# Patient Record
Sex: Female | Born: 2000 | Race: White | Hispanic: No | Marital: Married | Smoking: Former smoker
Health system: Southern US, Community
[De-identification: ages and names within clinical notes are randomized; demographics above are authoritative.]

## PROBLEM LIST (undated history)

## (undated) DIAGNOSIS — F913 Oppositional defiant disorder: Secondary | ICD-10-CM

## (undated) DIAGNOSIS — Z9889 Other specified postprocedural states: Secondary | ICD-10-CM

## (undated) DIAGNOSIS — R112 Nausea with vomiting, unspecified: Secondary | ICD-10-CM

## (undated) DIAGNOSIS — A159 Respiratory tuberculosis unspecified: Secondary | ICD-10-CM

## (undated) DIAGNOSIS — E0590 Thyrotoxicosis, unspecified without thyrotoxic crisis or storm: Secondary | ICD-10-CM

## (undated) DIAGNOSIS — R569 Unspecified convulsions: Secondary | ICD-10-CM

## (undated) DIAGNOSIS — M5126 Other intervertebral disc displacement, lumbar region: Secondary | ICD-10-CM

## (undated) DIAGNOSIS — F329 Major depressive disorder, single episode, unspecified: Secondary | ICD-10-CM

## (undated) DIAGNOSIS — F32A Depression, unspecified: Secondary | ICD-10-CM

## (undated) DIAGNOSIS — F419 Anxiety disorder, unspecified: Secondary | ICD-10-CM

## (undated) DIAGNOSIS — R519 Headache, unspecified: Secondary | ICD-10-CM

## (undated) DIAGNOSIS — F988 Other specified behavioral and emotional disorders with onset usually occurring in childhood and adolescence: Secondary | ICD-10-CM

## (undated) DIAGNOSIS — I499 Cardiac arrhythmia, unspecified: Secondary | ICD-10-CM

## (undated) HISTORY — PX: WISDOM TOOTH EXTRACTION: SHX21

---
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
## 2019-04-27 DIAGNOSIS — M2550 Pain in unspecified joint: Secondary | ICD-10-CM | POA: Insufficient documentation

## 2019-05-10 DIAGNOSIS — F331 Major depressive disorder, recurrent, moderate: Secondary | ICD-10-CM | POA: Insufficient documentation

## 2019-10-31 ENCOUNTER — Ambulatory Visit (HOSPITAL_COMMUNITY): Payer: Medicaid Other | Attending: Family Medicine | Admitting: Physical Therapy

## 2019-10-31 ENCOUNTER — Encounter (INDEPENDENT_AMBULATORY_CARE_PROVIDER_SITE_OTHER): Payer: Self-pay

## 2019-10-31 ENCOUNTER — Encounter (HOSPITAL_COMMUNITY): Payer: Self-pay | Admitting: Physical Therapy

## 2019-10-31 ENCOUNTER — Other Ambulatory Visit: Payer: Self-pay

## 2019-10-31 DIAGNOSIS — M545 Low back pain, unspecified: Secondary | ICD-10-CM

## 2019-10-31 DIAGNOSIS — G8929 Other chronic pain: Secondary | ICD-10-CM | POA: Insufficient documentation

## 2019-10-31 DIAGNOSIS — M6281 Muscle weakness (generalized): Secondary | ICD-10-CM | POA: Diagnosis present

## 2019-10-31 NOTE — Therapy (Signed)
[facility] [address] [place], [place], [address] Phone: [phone]   Fax:  [phone]  Physical Therapy Evaluation  Patient Details  Name: Sandy Carter MRN: [id number] Date of Birth: June 15, 2001 Referring Provider (PT): Tanna Furry, MD   Encounter Date: 10/31/2019  PT End of Session - 10/31/19 1618    Visit Number  1    Number of Visits  9    Date for PT Re-Evaluation  11/28/19    Authorization Type  Medicaid    Authorization Time Period  10/31/19 - 11/28/19    Authorization - Visit Number  0    Authorization - Number of Visits  8    PT Start Time  1525   patient arrived late   PT Stop Time  1615    PT Time Calculation (min)  50 min    Activity Tolerance  Patient tolerated treatment well    Behavior During Therapy  [facility] for tasks assessed/performed;Anxious       Past Medical History:  Diagnosis Date  . ADD (attention deficit disorder)    Borderline  . Anxiety   . Depression   . ODD (oppositional defiant disorder)     History reviewed. No pertinent surgical history.  There were no vitals filed for this visit.   Subjective Assessment - 10/31/19 1535    Subjective  Patient reported that she has had back pain since 3rd or 4th grade. She doesn't remember what happened for sure, but she did report an incident involving a table falling on her leg which she said may have triggered her initial back pain. She reported that she has had a cortisone shot in her back, but that it did not help. She reported that the pain goes from her mid back down through her low back. She reported that she has hypothyroidism and that issues with this have been increasing recently. She reported that she does not have radiating pain through legs. Patient denied any tingling or numbness. She reported that laying down makes the pain worse and that she feels like she has to move every few minutes. 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  Patient reported that the maximum her back pain has been is a 7/10. Patient reported that she likes to do sit-ups but usually can only do a few before she feels like she is "slamming her back" into the ground.    Limitations  Sitting;Standing    How long can you sit comfortably?  30 minutes    How long can you stand comfortably?  30 minutes    How long can you walk comfortably?  Couple hours    Diagnostic tests  X-ray    Patient Stated Goals  Less pain    Currently in Pain?  Yes    Pain Score  5     Pain Location  Back    Pain Orientation  Right    Pain Descriptors / Indicators  Sharp    Pain Type  Chronic pain    Pain Onset  More than a month ago    Pain Frequency  Constant    Aggravating Factors   laying down    Pain Relieving Factors  Nothing    Effect of Pain on Daily Activities  Moderately         [facility] PT Assessment - 10/31/19 0001      Assessment   Medical Diagnosis  LBP    Referring Provider (PT)  Ralene Bathe  Zhou-Talbert, MD    Onset Date/Surgical Date  --   Many years   Next MD Visit  Unknown    Prior Therapy  None      Precautions   Precautions  None      Restrictions   Weight Bearing Restrictions  No      Balance Screen   Has the patient fallen in the past 6 months  No    Has the patient had a decrease in activity level because of a fear of falling?   No    Is the patient reluctant to leave their home because of a fear of falling?   No      Home Film/video editor residence    Living Arrangements  Spouse/significant other    Type of [place]  Two level      Prior Function   Level of Independence  Independent    Vocation  Unemployed;Student      Cognition   Overall Cognitive Status  Within Functional Limits for tasks assessed      Observation/Other Assessments   Observations  No observed scoliotic curve with forward bend test      Sensation   Light Touch  Appears Intact      Deep Tendon Reflexes   DTR  Assessment Site  Patella    Patella DTR  2+      ROM / Strength   AROM / PROM / Strength  AROM;Strength      AROM   AROM Assessment Site  Lumbar    Lumbar Flexion  WFL    Lumbar Extension  WFL, painful    Lumbar - Right Side Bend  WFL, painful    Lumbar - Left Side Bend  WFL    Lumbar - Right Rotation  WFL    Lumbar - Left Rotation  [facility]      Strength   Strength Assessment Site  Hip;Knee;Ankle    Right/Left Hip  Right;Left    Right Hip Flexion  5/5    Right Hip Extension  4+/5    Right Hip ABduction  4+/5    Left Hip Flexion  5/5    Left Hip Extension  4+/5    Left Hip ABduction  4+/5    Right/Left Knee  Right;Left    Right Knee Flexion  5/5    Right Knee Extension  5/5    Left Knee Flexion  5/5    Left Knee Extension  5/5    Right/Left Ankle  Right;Left    Right Ankle Dorsiflexion  5/5    Left Ankle Dorsiflexion  5/5      Flexibility   Soft Tissue Assessment /Muscle Length  yes    Hamstrings  WFL      Palpation   Spinal mobility  Hypermobile throughout    Palpation comment  Tender through lumbar spine, painful with SI compression and distraction. Denied pain through bilateral gluteals or to lumbar paraspinals.       Balance   Balance Assessed  --   SLS > 1 min bilateral               Objective measurements completed on examination: See above findings.              PT Education - 10/31/19 1618    Education Details  Discussed examination findings and POC.    Person(s) Educated  Patient    Methods  Explanation    Comprehension  Verbalized understanding       PT Short Term Goals - 10/31/19 1620      PT SHORT TERM GOAL #1   Title  Patient will report understanding and regular compliance with HEP to improve strength and decrease back pain.    Time  2    Period  Weeks    Status  New    Target Date  11/14/19      PT SHORT TERM GOAL #2   Title  Patient will report that her back pain has not exceeded a 5/10 over the course of a 1 week  period indicating improved tolerance to daily activities.    Time  2    Period  Weeks    Status  New    Target Date  11/14/19        PT Long Term Goals - 10/31/19 1621      PT LONG TERM GOAL #1   Title  Patient will report that her back pain has not exceeded a 3/10 over the course of a 1 week period indicating improved tolerance to daily activities.    Time  4    Period  Weeks    Status  New    Target Date  11/28/19      PT LONG TERM GOAL #2   Title  Patient will report an improvement of at least 50% in overall symptoms to improve QOL.    Time  4    Period  Weeks    Status  New    Target Date  11/28/19      PT LONG TERM GOAL #3   Title  Patient will report ability to sit for at least 1 hour in order to perform school work without increased pain.    Time  4    Period  Weeks    Status  New    Target Date  11/28/19      PT LONG TERM GOAL #4   Title  Patient will report ability to stand for at least 1 hour to perform household chores without increased pain.    Time  4    Period  Weeks    Status  New    Target Date  11/28/19             Plan - 10/31/19 1639    Clinical Impression Statement  Patient is a 19 year old female with ongoing low back pain of many years time. Patient presents with pain with some lumbar AROM as well as decreased strength in some proximal hip muscles. In addition, patient demonstrates self-manipulation of spine and other joints during evaluation. Hypermobility was felt with spinal assessment throughout spine as well as some positive SI tests. Patient's signs and symptoms point most to low back pain with hypermobility indicating strengthening as primary focus of therapy. Patient would benefit from skilled physical therapy in order to address the abovementioned deficits and help patient return to her PLOF.    Personal Factors and Comorbidities  Time since onset of injury/illness/exacerbation;Behavior Pattern;Comorbidity 3+    Comorbidities  MDD, ODD,  Anxiety    Examination-Activity Limitations  Stand;Locomotion Level;Sleep    Examination-Participation Restrictions  Cleaning;Meal Prep;Community Activity;School;Shop    Stability/Clinical Decision Making  Stable/Uncomplicated    Clinical Decision Making  Low    Rehab Potential  Good    PT Frequency  2x / week    PT Duration  4 weeks    PT  Treatment/Interventions  ADLs/Self Care Home Management;Aquatic Therapy;Cryotherapy;Electrical Stimulation;Moist Heat;Traction;DME Instruction;Gait training;Stair training;Functional mobility training;Therapeutic activities;Therapeutic exercise;Balance training;Neuromuscular re-education;Patient/family education;Manual techniques;Passive range of motion;Dry needling    PT Next Visit Plan  Review eval/goals. Focus on core strengthening and provide for HEP trial: plank variations, quadruped variations. Postural strengthening.    PT Home Exercise Plan  Initiate at first session    Consulted and Agree with Plan of Care  Patient       Patient will benefit from skilled therapeutic intervention in order to improve the following deficits and impairments:  Improper body mechanics, Pain, Hypermobility, Decreased activity tolerance, Decreased endurance, Postural dysfunction, Decreased strength  Visit Diagnosis: Chronic bilateral low back pain without sciatica  Muscle weakness (generalized)     Problem List Patient Active Problem List   Diagnosis Date Noted  . Itchy eyes 02/16/2016  . MDD (major depressive disorder), recurrent severe, 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  . Disruptive mood dysregulation disorder (HCC) 06/10/2014  . 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 ODD (oppositional defiant disorder) 06/10/2014   Verne Carrow PT, DPT 4:44 PM, 10/31/19 [phone]  [facility] [address] [place], [place], [address] Phone: [phone]   Fax:  [phone]  Name: Sandy Carter MRN:  [id number] Date of Birth: 2000/12/10

## 2019-11-02 ENCOUNTER — Ambulatory Visit (HOSPITAL_COMMUNITY): Payer: Medicaid Other | Admitting: Physical Therapy

## 2019-11-02 ENCOUNTER — Other Ambulatory Visit: Payer: Self-pay

## 2019-11-02 ENCOUNTER — Encounter (HOSPITAL_COMMUNITY): Payer: Self-pay | Admitting: Physical Therapy

## 2019-11-02 DIAGNOSIS — M545 Low back pain, unspecified: Secondary | ICD-10-CM

## 2019-11-02 DIAGNOSIS — M6281 Muscle weakness (generalized): Secondary | ICD-10-CM

## 2019-11-02 DIAGNOSIS — G8929 Other chronic pain: Secondary | ICD-10-CM

## 2019-11-02 NOTE — Patient Instructions (Signed)
[facility]: [facility]

## 2019-11-02 NOTE — Therapy (Signed)
[facility] [address] [place], [place], [address] Phone: [phone]   Fax:  [phone]  Physical Therapy Treatment  Patient Details  Name: Sandy Carter MRN: [id number] Date of Birth: 11/19/2000 Referring Provider (PT): Tanna Furry, MD   Encounter Date: 11/02/2019  PT End of Session - 11/02/19 1443    Visit Number  2    Number of Visits  9    Date for PT Re-Evaluation  11/28/19    Authorization Type  Medicaid    Authorization Time Period  10/31/19 - 11/28/19; 11/01/19-11/28/19    Authorization - Visit Number  1    Authorization - Number of Visits  8    Progress Note Due on Visit  9    PT Start Time  1440   Patient arrived late   PT Stop Time  1515    PT Time Calculation (min)  35 min    Activity Tolerance  Patient tolerated treatment well    Behavior During Therapy  [facility] for tasks assessed/performed;Anxious       Past Medical History:  Diagnosis Date  . ADD (attention deficit disorder)    Borderline  . Anxiety   . Depression   . ODD (oppositional defiant disorder)     History reviewed. No pertinent surgical history.  There were no vitals filed for this visit.  Subjective Assessment - 11/02/19 1442    Subjective  Patient reported getting a birth control shot and pain is shooting up her arm from this.    Limitations  Sitting;Standing    How long can you sit comfortably?  30 minutes    How long can you stand comfortably?  30 minutes    How long can you walk comfortably?  Couple hours    Diagnostic tests  X-ray    Patient Stated Goals  Less pain    Currently in Pain?  Yes    Pain Score  7     Pain Location  Back    Pain Orientation  Lower    Pain Descriptors / Indicators  Sharp    Pain Type  Chronic pain    Pain Onset  More than a month ago                       [facility] Adult PT Treatment/Exercise - 11/02/19 0001      Exercises   Exercises  Lumbar      Lumbar Exercises: Supine   Ab Set   20 reps;5 seconds    Bent Knee Raise  10 reps;2 seconds    Bent Knee Raise Limitations  Alternating sides. Cues for abdominal set prior.       Lumbar Exercises: Quadruped   Single Arm Raise  Right;Left;5 reps;2 seconds   Cues for proper form   Straight Leg Raise  5 reps;2 seconds    Straight Leg Raises Limitations  Alternating sides, cues for proper form      Manual Therapy   Manual Therapy  Soft tissue mobilization;Joint mobilization    Manual therapy comments  All manual completed separately from other skilled interventions    Joint Mobilization  T10-L3 TPAs Grade II for pain relief 10-20 second oscillations. Sacral mobility at superior border alternating sides gentle for pain relief.     Soft tissue mobilization  Lumbar paraspinals RT>LT for relaxation and pain relief             PT Education - 11/02/19 1528  Education Details  Discussed purpose and technique of interventions throughout session and educated on HEP.    Person(s) Educated  Patient    Methods  Explanation;Handout    Comprehension  Verbalized understanding       PT Short Term Goals - 11/02/19 1444      PT SHORT TERM GOAL #1   Title  Patient will report understanding and regular compliance with HEP to improve strength and decrease back pain.    Time  2    Period  Weeks    Status  On-going    Target Date  11/14/19      PT SHORT TERM GOAL #2   Title  Patient will report that her back pain has not exceeded a 5/10 over the course of a 1 week period indicating improved tolerance to daily activities.    Time  2    Period  Weeks    Status  On-going    Target Date  11/14/19        PT Long Term Goals - 11/02/19 1445      PT LONG TERM GOAL #1   Title  Patient will report that her back pain has not exceeded a 3/10 over the course of a 1 week period indicating improved tolerance to daily activities.    Time  4    Period  Weeks    Status  On-going      PT LONG TERM GOAL #2   Title  Patient will report  an improvement of at least 50% in overall symptoms to improve QOL.    Time  4    Period  Weeks    Status  On-going      PT LONG TERM GOAL #3   Title  Patient will report ability to sit for at least 1 hour in order to perform school work without increased pain.    Time  4    Period  Weeks    Status  On-going      PT LONG TERM GOAL #4   Title  Patient will report ability to stand for at least 1 hour to perform household chores without increased pain.    Time  4    Period  Weeks    Status  On-going            Plan - 11/02/19 1538    Clinical Impression Statement  Began by reviewing patient's goals. Focused on abdominal strengthening exercises this session educating patient on transverse abdominus activation and provided verbal cues for improved form. Ended session with manual therapy including gentle spinal joint mobilizations to thoracic spine and lumbar spine as well as gentle mobility of the sacrum for pain relief. Patient reported an improvement in her symptoms following manual therapy.    Personal Factors and Comorbidities  Time since onset of injury/illness/exacerbation;Behavior Pattern;Comorbidity 3+    Comorbidities  MDD, ODD, Anxiety    Examination-Activity Limitations  Stand;Locomotion Level;Sleep    Examination-Participation Restrictions  Cleaning;Meal Prep;Community Activity;School;Shop    Stability/Clinical Decision Making  Stable/Uncomplicated    Rehab Potential  Good    PT Frequency  2x / week    PT Duration  4 weeks    PT Treatment/Interventions  ADLs/Self Care Home Management;Aquatic Therapy;Cryotherapy;Electrical Stimulation;Moist Heat;Traction;DME Instruction;Gait training;Stair training;Functional mobility training;Therapeutic activities;Therapeutic exercise;Balance training;Neuromuscular re-education;Patient/family education;Manual techniques;Passive range of motion;Dry needling    PT Next Visit Plan  Focus on core strengthening. F/U regarding tolerance to manual  and consider adding rib mobilizations. Consider progressing to  plank variations and postural strengthening.    PT Home Exercise Plan  11/02/19: Abdominal set, quadruped leg lifts, quadruped arm lifts    Consulted and Agree with Plan of Care  Patient       Patient will benefit from skilled therapeutic intervention in order to improve the following deficits and impairments:  Improper body mechanics, Pain, Hypermobility, Decreased activity tolerance, Decreased endurance, Postural dysfunction, Decreased strength  Visit Diagnosis: Chronic bilateral low back pain without sciatica  Muscle weakness (generalized)     Problem List Patient Active Problem List   Diagnosis Date Noted  . Itchy eyes 02/16/2016  . MDD (major depressive disorder), recurrent severe, without psychosis ([place]) 02/11/2016  . Disruptive mood dysregulation disorder ([place]) 06/10/2014  . 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 ODD (oppositional defiant disorder) 06/10/2014   Clarene Critchley PT, DPT 3:41 PM, 11/02/19 [place], [place], [address] Phone: [phone]   Fax:  [phone]  Name: Sandy Carter MRN: [id number] Date of Birth: 01-Jan-2001

## 2019-11-07 ENCOUNTER — Other Ambulatory Visit: Payer: Self-pay

## 2019-11-07 ENCOUNTER — Ambulatory Visit (HOSPITAL_COMMUNITY): Payer: Medicaid Other | Attending: Family Medicine | Admitting: Physical Therapy

## 2019-11-07 ENCOUNTER — Encounter (HOSPITAL_COMMUNITY): Payer: Self-pay | Admitting: Physical Therapy

## 2019-11-07 DIAGNOSIS — M545 Other chronic pain: Secondary | ICD-10-CM

## 2019-11-07 DIAGNOSIS — M6281 Muscle weakness (generalized): Secondary | ICD-10-CM | POA: Insufficient documentation

## 2019-11-07 DIAGNOSIS — G8929 Other chronic pain: Secondary | ICD-10-CM | POA: Insufficient documentation

## 2019-11-07 NOTE — Therapy (Signed)
[facility] [address] [place], [place], [address] Phone: [phone]   Fax:  [phone]  Physical Therapy Treatment  Patient Details  Name: Sandy Carter MRN: [id number] Date of Birth: September 15, 2000 Referring Provider (PT): Tanna Furry, MD   Encounter Date: 11/07/2019  PT End of Session - 11/07/19 1445    Visit Number  3    Number of Visits  9    Date for PT Re-Evaluation  11/28/19    Authorization Type  Medicaid    Authorization Time Period  10/31/19 - 11/28/19; 11/01/19-11/28/19    Authorization - Visit Number  2    Authorization - Number of Visits  8    Progress Note Due on Visit  9    PT Start Time  1446    PT Stop Time  1526    PT Time Calculation (min)  40 min    Activity Tolerance  Patient tolerated treatment well    Behavior During Therapy  [facility] for tasks assessed/performed;Anxious       Past Medical History:  Diagnosis Date  . ADD (attention deficit disorder)    Borderline  . Anxiety   . Depression   . ODD (oppositional defiant disorder)     History reviewed. No pertinent surgical history.  There were no vitals filed for this visit.  Subjective Assessment - 11/07/19 1447    Subjective  Exercises she has been doing are  making it worse, pain is in the same spot in her lower back. States she doesn't like any of the exercises she gave her because they hurt. Some of the hands on treatment also hurt last time. States she currently has a headache.    Limitations  Sitting;Standing    How long can you sit comfortably?  30 minutes    How long can you stand comfortably?  30 minutes    How long can you walk comfortably?  Couple hours    Diagnostic tests  X-ray    Patient Stated Goals  Less pain    Currently in Pain?  Yes    Pain Score  3     Pain Location  Back    Pain Orientation  Lower    Pain Descriptors / Indicators  Sharp    Pain Onset  More than a month ago         [facility] PT Assessment - 11/07/19 0001      Assessment   Medical Diagnosis  LBP    Referring Provider (PT)  Tanna Furry, MD                   [facility] Adult PT Treatment/Exercise - 11/07/19 0001      Lumbar Exercises: Stretches   Lower Trunk Rotation  --   2x20 on exercise ball - 5" holds on each side     Lumbar Exercises: Supine   Ab Set  10 reps;5 seconds   supported 90/90 on green ball, 3 sets, self tactile cues   Other Supine Lumbar Exercises  hamstring isometrics with ball in 90/90 3x5, 5"holds              PT Education - 11/07/19 1521    Education Details  on pressure points to help with nausea and headache symptoms - trialed in clinic- headache decreased.    Person(s) Educated  Patient    Methods  Explanation    Comprehension  Verbalized understanding       PT Short Term  Goals - 11/02/19 1444      PT SHORT TERM GOAL #1   Title  Patient will report understanding and regular compliance with HEP to improve strength and decrease back pain.    Time  2    Period  Weeks    Status  On-going    Target Date  11/14/19      PT SHORT TERM GOAL #2   Title  Patient will report that her back pain has not exceeded a 5/10 over the course of a 1 week period indicating improved tolerance to daily activities.    Time  2    Period  Weeks    Status  On-going    Target Date  11/14/19        PT Long Term Goals - 11/02/19 1445      PT LONG TERM GOAL #1   Title  Patient will report that her back pain has not exceeded a 3/10 over the course of a 1 week period indicating improved tolerance to daily activities.    Time  4    Period  Weeks    Status  On-going      PT LONG TERM GOAL #2   Title  Patient will report an improvement of at least 50% in overall symptoms to improve QOL.    Time  4    Period  Weeks    Status  On-going      PT LONG TERM GOAL #3   Title  Patient will report ability to sit for at least 1 hour in order to perform school work without increased pain.    Time  4    Period   Weeks    Status  On-going      PT LONG TERM GOAL #4   Title  Patient will report ability to stand for at least 1 hour to perform household chores without increased pain.    Time  4    Period  Weeks    Status  On-going            Plan - 11/07/19 1531    Clinical Impression Statement  Patient tolerated session moderately well reporting reducitnin headaceh symptoms with self pressure point relief. Focused on supported 90/90 posiiton exercises as previous exercises were causing increase in sympotms. No manual performed on this date secondary to no enjoying some of the manual that was performed on the previous session. Will continue to focus on gentle core activation and motion as tolerated by patient. Will follow up with pressure points next session. Patient will continue to benefit from skilled physical therapy at this time to improve QOL.    Personal Factors and Comorbidities  Time since onset of injury/illness/exacerbation;Behavior Pattern;Comorbidity 3+    Comorbidities  MDD, ODD, Anxiety    Examination-Activity Limitations  Stand;Locomotion Level;Sleep    Examination-Participation Restrictions  Cleaning;Meal Prep;Community Activity;School;Shop    Stability/Clinical Decision Making  Stable/Uncomplicated    Rehab Potential  Good    PT Frequency  2x / week    PT Duration  4 weeks    PT Treatment/Interventions  ADLs/Self Care Home Management;Aquatic Therapy;Cryotherapy;Electrical Stimulation;Moist Heat;Traction;DME Instruction;Gait training;Stair training;Functional mobility training;Therapeutic activities;Therapeutic exercise;Balance training;Neuromuscular re-education;Patient/family education;Manual techniques;Passive range of motion;Dry needling    PT Next Visit Plan  90/90 supported posiiton for mobility and core work to start. F/u with pressure points for headache. Consider adding rib mobilizations (light pressure). Consider progressing to plank variations and postural strengthening.     PT Home Exercise Plan  11/02/19: Abdominal set, quadruped leg lifts, quadruped arm lifts (these put on hold per patient having increase in symptoms). 3/1 90/90 supported position and with TRA contraction.    Consulted and Agree with Plan of Care  Patient       Patient will benefit from skilled therapeutic intervention in order to improve the following deficits and impairments:  Improper body mechanics, Pain, Hypermobility, Decreased activity tolerance, Decreased endurance, Postural dysfunction, Decreased strength  Visit Diagnosis: Chronic bilateral low back pain without sciatica  Muscle weakness (generalized)     Problem List Patient Active Problem List   Diagnosis Date Noted  . Itchy eyes 02/16/2016  . MDD (major depressive disorder), recurrent severe, without psychosis ([place]) 02/11/2016  . Disruptive mood dysregulation disorder ([place]) 06/10/2014  . 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 ODD (oppositional defiant disorder) 06/10/2014    3:33 PM, 11/07/19 Jerene Pitch, DPT Physical Therapy with [facility]  [phone] office  [place] [address] [place], [place], [address] Phone: [phone]   Fax:  [phone]  Name: Sandy Carter MRN: [id number] Date of Birth: 2001/08/28

## 2019-11-09 ENCOUNTER — Other Ambulatory Visit: Payer: Self-pay

## 2019-11-09 ENCOUNTER — Encounter (HOSPITAL_COMMUNITY): Payer: Self-pay | Admitting: Physical Therapy

## 2019-11-09 ENCOUNTER — Ambulatory Visit (HOSPITAL_COMMUNITY): Payer: Medicaid Other | Admitting: Physical Therapy

## 2019-11-09 DIAGNOSIS — M6281 Muscle weakness (generalized): Secondary | ICD-10-CM

## 2019-11-09 DIAGNOSIS — M545 Low back pain: Secondary | ICD-10-CM | POA: Diagnosis not present

## 2019-11-09 DIAGNOSIS — G8929 Other chronic pain: Secondary | ICD-10-CM

## 2019-11-09 NOTE — Therapy (Signed)
[facility] [address] [place], [place], [address] Phone: [phone]   Fax:  [phone]  Physical Therapy Treatment  Patient Details  Name: Sandy Carter MRN: [id number] Date of Birth: 05/13/2001 Referring Provider (PT): Tanna Furry, MD   Encounter Date: 11/09/2019  PT End of Session - 11/09/19 1358    Visit Number  4    Number of Visits  9    Date for PT Re-Evaluation  11/28/19    Authorization Type  Medicaid    Authorization Time Period  10/31/19 - 11/28/19; 11/01/19-11/28/19    Authorization - Visit Number  3    Authorization - Number of Visits  8    Progress Note Due on Visit  9    PT Start Time  1346    PT Stop Time  1424    PT Time Calculation (min)  38 min    Activity Tolerance  Patient tolerated treatment well    Behavior During Therapy  [facility] for tasks assessed/performed;Anxious       Past Medical History:  Diagnosis Date  . ADD (attention deficit disorder)    Borderline  . Anxiety   . Depression   . ODD (oppositional defiant disorder)     History reviewed. No pertinent surgical history.  There were no vitals filed for this visit.  Subjective Assessment - 11/09/19 1348    Subjective  Patient reported that her back isn't hurting that bad currently.    Limitations  Sitting;Standing    How long can you sit comfortably?  30 minutes    How long can you stand comfortably?  30 minutes    How long can you walk comfortably?  Couple hours    Diagnostic tests  X-ray    Patient Stated Goals  Less pain    Currently in Pain?  Yes    Pain Score  4     Pain Location  Back    Pain Orientation  Lower    Pain Descriptors / Indicators  Sharp    Pain Type  Chronic pain    Pain Onset  More than a month ago                       [facility] Adult PT Treatment/Exercise - 11/09/19 0001      Lumbar Exercises: Stretches   Lower Trunk Rotation  --   LTR 3x20 5'' holds on green physioball     Lumbar Exercises:  Supine   Ab Set  10 reps;5 seconds   supported 90/90 green ball 3 sets   Other Supine Lumbar Exercises  Alternating upper extremity reaching with ab set. Legs in  90/90 over green physioball x10    Other Supine Lumbar Exercises  hamstring isometrics with ball in 90/90 3x5, 5"holds              PT Education - 11/09/19 1358    Education Details  Discussed purpose of interventions throughout.    Person(s) Educated  Patient    Methods  Explanation    Comprehension  Verbalized understanding       PT Short Term Goals - 11/02/19 1444      PT SHORT TERM GOAL #1   Title  Patient will report understanding and regular compliance with HEP to improve strength and decrease back pain.    Time  2    Period  Weeks    Status  On-going    Target Date  11/14/19  PT SHORT TERM GOAL #2   Title  Patient will report that her back pain has not exceeded a 5/10 over the course of a 1 week period indicating improved tolerance to daily activities.    Time  2    Period  Weeks    Status  On-going    Target Date  11/14/19        PT Long Term Goals - 11/02/19 1445      PT LONG TERM GOAL #1   Title  Patient will report that her back pain has not exceeded a 3/10 over the course of a 1 week period indicating improved tolerance to daily activities.    Time  4    Period  Weeks    Status  On-going      PT LONG TERM GOAL #2   Title  Patient will report an improvement of at least 50% in overall symptoms to improve QOL.    Time  4    Period  Weeks    Status  On-going      PT LONG TERM GOAL #3   Title  Patient will report ability to sit for at least 1 hour in order to perform school work without increased pain.    Time  4    Period  Weeks    Status  On-going      PT LONG TERM GOAL #4   Title  Patient will report ability to stand for at least 1 hour to perform household chores without increased pain.    Time  4    Period  Weeks    Status  On-going            Plan - 11/09/19 1442     Clinical Impression Statement  Patient reported not having a lot of pain this session. Continued with exercises in a supported 90/90 position. Added upper extremity arm movement with abdominal set this session. Discussed again with patient performing these exercises at home instead of initial provided HEP and considering trialing resuming previous exercises at a later time.    Personal Factors and Comorbidities  Time since onset of injury/illness/exacerbation;Behavior Pattern;Comorbidity 3+    Comorbidities  MDD, ODD, Anxiety    Examination-Activity Limitations  Stand;Locomotion Level;Sleep    Examination-Participation Restrictions  Cleaning;Meal Prep;Community Activity;School;Shop    Stability/Clinical Decision Making  Stable/Uncomplicated    Rehab Potential  Good    PT Frequency  2x / week    PT Duration  4 weeks    PT Treatment/Interventions  ADLs/Self Care Home Management;Aquatic Therapy;Cryotherapy;Electrical Stimulation;Moist Heat;Traction;DME Instruction;Gait training;Stair training;Functional mobility training;Therapeutic activities;Therapeutic exercise;Balance training;Neuromuscular re-education;Patient/family education;Manual techniques;Passive range of motion;Dry needling    PT Next Visit Plan  90/90 supported posiiton for mobility and core work to start.  Consider adding rib mobilizations (light pressure). Consider progressing to plank variations and postural strengthening.    PT Home Exercise Plan  11/02/19: Abdominal set, quadruped leg lifts, quadruped arm lifts (these put on hold per patient having increase in symptoms). 3/1 90/90 supported position and with TRA contraction.    Consulted and Agree with Plan of Care  Patient       Patient will benefit from skilled therapeutic intervention in order to improve the following deficits and impairments:  Improper body mechanics, Pain, Hypermobility, Decreased activity tolerance, Decreased endurance, Postural dysfunction, Decreased  strength  Visit Diagnosis: Chronic bilateral low back pain without sciatica  Muscle weakness (generalized)     Problem List Patient Active Problem List  Diagnosis Date Noted  . Itchy eyes 02/16/2016  . MDD (major depressive disorder), recurrent severe, 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  . Disruptive mood dysregulation disorder ([facility]) 06/10/2014  . 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 ODD (oppositional defiant disorder) 06/10/2014   Verne Carrow PT, DPT 2:45 PM, 11/09/19 [phone]  [facility] [address] [place], [place], [address] Phone: [phone]   Fax:  [phone]  Name: Sandy Carter MRN: [id number] Date of Birth: 2000-09-14

## 2019-11-14 ENCOUNTER — Ambulatory Visit (HOSPITAL_COMMUNITY): Payer: Medicaid Other | Admitting: Physical Therapy

## 2019-11-14 ENCOUNTER — Other Ambulatory Visit: Payer: Self-pay

## 2019-11-14 ENCOUNTER — Encounter (HOSPITAL_COMMUNITY): Payer: Self-pay | Admitting: Physical Therapy

## 2019-11-14 DIAGNOSIS — M545 Other chronic pain: Secondary | ICD-10-CM

## 2019-11-14 DIAGNOSIS — M6281 Muscle weakness (generalized): Secondary | ICD-10-CM

## 2019-11-14 DIAGNOSIS — G8929 Other chronic pain: Secondary | ICD-10-CM

## 2019-11-15 NOTE — Therapy (Signed)
[place], [place], [address] Phone: [phone]   Fax:  [phone]  Physical Therapy Treatment  Patient Details  Name: Sandy Carter MRN: [id number] Date of Birth: 10-Nov-2000 Referring Provider (PT): Alfonse Flavors, MD   Encounter Date: 11/14/2019  PT End of Session - 11/14/19 1450    Visit Number  5    Number of Visits  9    Date for PT Re-Evaluation  11/28/19    Authorization Type  Medicaid    Authorization Time Period  10/31/19 - 11/28/19; 11/01/19-11/28/19    Authorization - Visit Number  4    Authorization - Number of Visits  8    Progress Note Due on Visit  9    PT Start Time  1450   patient late to session   PT Stop Time  1528    PT Time Calculation (min)  38 min    Activity Tolerance  Patient tolerated treatment well    Behavior During Therapy  [facility] for tasks assessed/performed;Anxious       Past Medical History:  Diagnosis Date  . ADD (attention deficit disorder)    Borderline  . Anxiety   . Depression   . ODD (oppositional defiant disorder)     History reviewed. No pertinent surgical history.  There were no vitals filed for this visit.  Subjective Assessment - 11/14/19 1453    Subjective  States that her in here 90/90 on ball feels good but at home she doesn't have pain. States that overall she feels like she isn't really better. States that she feels about 15% better. States she was moving something heavy and she felt like something shift in her back. States she had her boyfriend massage her upper back but her pain came back a little late. 4/10 pain in upper and lower back. States she thinks her laying down is aggravating her exercises.    Limitations  Sitting;Standing    How long can you sit comfortably?  30 minutes    How long can you stand comfortably?  30 minutes    How long can you walk comfortably?  Couple hours    Diagnostic tests  X-ray    Patient Stated Goals  Less pain     Currently in Pain?  Yes    Pain Score  4     Pain Location  Back    Pain Orientation  Upper;Lower    Pain Descriptors / Indicators  Sharp    Pain Onset  More than a month ago         [facility] PT Assessment - 11/15/19 0001      Assessment   Medical Diagnosis  LBP    Referring Provider (PT)  Alfonse Flavors, MD                   [facility] Adult PT Treatment/Exercise - 11/15/19 0001      Lumbar Exercises: Standing   Other Standing Lumbar Exercises  chiar pose x1 - increased knee pain - cue to shift weight posteriorly - decreased knee pain but increased low aback pain (tolerable) x6 30" holds       Lumbar Exercises: Seated   Other Seated Lumbar Exercises  on exercise ball - did not like - stopped             PT Education - 11/15/19 0941    Education Details  on current presenation. Difference between systemic and musculoskeletal pains/origins. Answered  all quesitons and concerns. Explained rational for different postures, positions and benefits.    Person(s) Educated  Patient    Methods  Explanation    Comprehension  Verbalized understanding       PT Short Term Goals - 11/02/19 1444      PT SHORT TERM GOAL #1   Title  Patient will report understanding and regular compliance with HEP to improve strength and decrease back pain.    Time  2    Period  Weeks    Status  On-going    Target Date  11/14/19      PT SHORT TERM GOAL #2   Title  Patient will report that her back pain has not exceeded a 5/10 over the course of a 1 week period indicating improved tolerance to daily activities.    Time  2    Period  Weeks    Status  On-going    Target Date  11/14/19        PT Long Term Goals - 11/02/19 1445      PT LONG TERM GOAL #1   Title  Patient will report that her back pain has not exceeded a 3/10 over the course of a 1 week period indicating improved tolerance to daily activities.    Time  4    Period  Weeks    Status  On-going      PT LONG TERM GOAL  #2   Title  Patient will report an improvement of at least 50% in overall symptoms to improve QOL.    Time  4    Period  Weeks    Status  On-going      PT LONG TERM GOAL #3   Title  Patient will report ability to sit for at least 1 hour in order to perform school work without increased pain.    Time  4    Period  Weeks    Status  On-going      PT LONG TERM GOAL #4   Title  Patient will report ability to stand for at least 1 hour to perform household chores without increased pain.    Time  4    Period  Weeks    Status  On-going            Plan - 11/14/19 1545    Clinical Impression Statement  Patient frustrated on this date. Initially patient reported poor tolerance to supine exercises so therapist inquired as to which position would be most comfortable for her but patient did not appreciate this question as she felt the therapist should know the position that would be most comfortable for her. Attempted seated on exercise ball positioning, but patient immediately did not like this position as she would never sit on a moveable surface. Transitioned to standing exercises which was tolerated the best but this increased discomfort in her knees. When form was adjusted to decrease knee pain, this then increased low back pain. Discussed current presentation of symptoms and how they do not seem to be musculoskeletal in origin. Patient seemed to agree with this and interested in follow up with specialist but has been frustrated with the current interactions with healthcare system as she feels that no one is truly listening to her. During today's session it was noted by the patient that prior to her recent increase in back pain, she did have previous posture difficulties as a child where she was literally slumped over and her adoptive mother would  have to strap boards to her back to help improve her upright posture. Discussed reassessment on next session to determine progress or lack of progress that  has been made since the start of physical therapy. Anticipate patient requiring follow up with specialist in regards to diffuse pain and also possible benefits from discussing her chronic pains and childhood postural limitations with a mental health specialist.    Personal Factors and Comorbidities  Time since onset of injury/illness/exacerbation;Behavior Pattern;Comorbidity 3+    Comorbidities  MDD, ODD, Anxiety    Examination-Activity Limitations  Stand;Locomotion Level;Sleep    Examination-Participation Restrictions  Cleaning;Meal Prep;Community Activity;School;Shop    Stability/Clinical Decision Making  Stable/Uncomplicated    Rehab Potential  Good    PT Frequency  2x / week    PT Duration  4 weeks    PT Treatment/Interventions  ADLs/Self Care Home Management;Aquatic Therapy;Cryotherapy;Electrical Stimulation;Moist Heat;Traction;DME Instruction;Gait training;Stair training;Functional mobility training;Therapeutic activities;Therapeutic exercise;Balance training;Neuromuscular re-education;Patient/family education;Manual techniques;Passive range of motion;Dry needling    PT Next Visit Plan  progress note next session. 90/90 supported posiiton for mobility and core work to start.  Consider adding rib mobilizations (light pressure). Consider progressing to plank variations and postural strengthening.    PT Home Exercise Plan  11/02/19: Abdominal set, quadruped leg lifts, quadruped arm lifts (these put on hold per patient having increase in symptoms). 3/1 90/90 supported position and with TRA contraction.    Consulted and Agree with Plan of Care  Patient       Patient will benefit from skilled therapeutic intervention in order to improve the following deficits and impairments:  Improper body mechanics, Pain, Hypermobility, Decreased activity tolerance, Decreased endurance, Postural dysfunction, Decreased strength  Visit Diagnosis: Chronic bilateral low back pain without sciatica  Muscle weakness  (generalized)     Problem List Patient Active Problem List   Diagnosis Date Noted  . Itchy eyes 02/16/2016  . MDD (major depressive disorder), recurrent severe, 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  . Disruptive mood dysregulation disorder (HCC) 06/10/2014  . 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 ODD (oppositional defiant disorder) 06/10/2014   9:44 AM, 11/15/19 Tereasa Coop, DPT Physical Therapy with [facility]  [phone] office  [facility] [address] [place], [place], [address] Phone: [phone]   Fax:  [phone]  Name: Jaylia Pettus MRN: [id number] Date of Birth: [birth year]

## 2019-11-16 ENCOUNTER — Ambulatory Visit (HOSPITAL_COMMUNITY): Payer: Medicaid Other | Admitting: Physical Therapy

## 2019-11-21 ENCOUNTER — Encounter (HOSPITAL_COMMUNITY): Payer: Self-pay | Admitting: Physical Therapy

## 2019-11-21 ENCOUNTER — Ambulatory Visit (HOSPITAL_COMMUNITY): Payer: Medicaid Other | Admitting: Physical Therapy

## 2019-11-21 ENCOUNTER — Other Ambulatory Visit: Payer: Self-pay

## 2019-11-21 DIAGNOSIS — M545 Other chronic pain: Secondary | ICD-10-CM

## 2019-11-21 DIAGNOSIS — M6281 Muscle weakness (generalized): Secondary | ICD-10-CM

## 2019-11-21 DIAGNOSIS — G8929 Other chronic pain: Secondary | ICD-10-CM

## 2019-11-21 NOTE — Therapy (Signed)
[place] [address] [place], [place], [address] Phone: [phone]   Fax:  [phone]  Physical Therapy Treatment and Discharge Note   Patient Details  Name: Sandy Carter MRN: [id number] Date of Birth: [birth year] Referring Provider (PT): Alfonse Flavors, MD  PHYSICAL THERAPY DISCHARGE SUMMARY  Visits from Start of Care: 6  Current functional level related to goals / functional outcomes: No short or long term goals met at this time   Remaining deficits: Continued pain and strength deficits   Education / Equipment: See below  Plan: Patient agrees to discharge.  Patient goals were not met. Patient is being discharged due to lack of progress.  ?????       Encounter Date: 11/21/2019  PT End of Session - 11/21/19 1524    Visit Number  6    Number of Visits  9    Date for PT Re-Evaluation  11/28/19    Authorization Type  Medicaid    Authorization Time Period  10/31/19 - 11/28/19; 11/01/19-11/28/19    Authorization - Visit Number  5    Authorization - Number of Visits  8    Progress Note Due on Visit  9    PT Start Time  9485    PT Stop Time  1521    PT Time Calculation (min)  33 min    Activity Tolerance  Patient tolerated treatment well    Behavior During Therapy  [facility] for tasks assessed/performed;Anxious       Past Medical History:  Diagnosis Date  . ADD (attention deficit disorder)    Borderline  . Anxiety   . Depression   . ODD (oppositional defiant disorder)     History reviewed. No pertinent surgical history.  There were no vitals filed for this visit.  Subjective Assessment - 11/21/19 1453    Subjective  States pain has been about the same and doesn't really improve. Started training for her new job. Talked to her regular doctor and she wants her to get an MRI and wants her to get in touch with a bone doctor.Currently has a headache that she has had since yesterday    Limitations  Sitting;Standing     How long can you sit comfortably?  30 minutes    How long can you stand comfortably?  30 minutes    How long can you walk comfortably?  Couple hours    Diagnostic tests  X-ray    Patient Stated Goals  Less pain    Currently in Pain?  Yes    Pain Score  5     Pain Location  Back    Pain Onset  More than a month ago         [facility] PT Assessment - 11/21/19 0001      Assessment   Medical Diagnosis  LBP    Referring Provider (PT)  Alfonse Flavors, MD      AROM   Lumbar Flexion  [facility]    Lumbar Extension  WFL, painful    Lumbar - Right Side Bend  WFL, painful    Lumbar - Left Side Bend  WFL, painful    Lumbar - Right Rotation  WFL, painful    Lumbar - Left Rotation  WFL, painful      Strength   Right Hip Flexion  5/5    Right Hip Extension  4/5   pain in low back    Right Hip ABduction  4+/5  Left Hip Flexion  5/5    Left Hip Extension  4/5   pain in low back    Left Hip ABduction  4+/5    Right Knee Flexion  4+/5    Right Knee Extension  5/5    Left Knee Flexion  4+/5   pain in left ribs   Right Ankle Dorsiflexion  5/5    Left Ankle Dorsiflexion  5/5                           PT Education - 11/21/19 1525    Education Details  on current condition, lack of progress made and who to follow up with next    Person(s) Educated  Patient    Methods  Explanation    Comprehension  Verbalized understanding       PT Short Term Goals - 11/21/19 1457      PT SHORT TERM GOAL #1   Title  Patient will report understanding and regular compliance with HEP to improve strength and decrease back pain.    Time  2    Period  Weeks    Status  Not Met    Target Date  11/14/19      PT SHORT TERM GOAL #2   Title  Patient will report that her back pain has not exceeded a 5/10 over the course of a 1 week period indicating improved tolerance to daily activities.    Time  2    Period  Weeks    Status  Not Met    Target Date  11/14/19        PT Long Term  Goals - 11/21/19 1457      PT LONG TERM GOAL #1   Title  Patient will report that her back pain has not exceeded a 3/10 over the course of a 1 week period indicating improved tolerance to daily activities.    Time  4    Period  Weeks    Status  Not Met      PT LONG TERM GOAL #2   Title  Patient will report an improvement of at least 50% in overall symptoms to improve QOL.    Baseline  currently reports 0% better    Time  4    Period  Weeks    Status  Not Met      PT LONG TERM GOAL #3   Title  Patient will report ability to sit for at least 1 hour in order to perform school work without increased pain.    Baseline  reports she can sit for at least an hour but sometimes less depends the day    Time  4    Period  Weeks    Status  Partially Met      PT LONG TERM GOAL #4   Title  Patient will report ability to stand for at least 1 hour to perform household chores without increased pain.    Baseline  currently 20 minutes    Time  4    Period  Weeks    Status  Not Met            Plan - 11/21/19 1524    Clinical Impression Statement  Patient present for a reassessment on this date. She has not met and short or long term goals at this time. Painful range of motion and strength has worsened since the initial evaluation. Reviewed measurements and findings  with patient and discussed following up with her MD as her symptoms do not seem musculoskeletal in origin. Patient agrees with plan and is to discharge from PT at this time.    Personal Factors and Comorbidities  Time since onset of injury/illness/exacerbation;Behavior Pattern;Comorbidity 3+    Comorbidities  MDD, ODD, Anxiety    Examination-Activity Limitations  Stand;Locomotion Level;Sleep    Examination-Participation Restrictions  Cleaning;Meal Prep;Community Activity;School;Shop    PT Frequency  2x / week    PT Duration  4 weeks    PT Treatment/Interventions  ADLs/Self Care Home Management;Aquatic Therapy;Cryotherapy;Electrical  Stimulation;Moist Heat;Traction;DME Instruction;Gait training;Stair training;Functional mobility training;Therapeutic activities;Therapeutic exercise;Balance training;Neuromuscular re-education;Patient/family education;Manual techniques;Passive range of motion;Dry needling    PT Next Visit Plan  patient to discharge from PT at this time.    PT Home Exercise Plan  11/02/19: Abdominal set, quadruped leg lifts, quadruped arm lifts (these put on hold per patient having increase in symptoms). 3/1 90/90 supported position and with TRA contraction.       Patient will benefit from skilled therapeutic intervention in order to improve the following deficits and impairments:  Improper body mechanics, Pain, Hypermobility, Decreased activity tolerance, Decreased endurance, Postural dysfunction, Decreased strength  Visit Diagnosis: Chronic bilateral low back pain without sciatica  Muscle weakness (generalized)     Problem List Patient Active Problem List   Diagnosis Date Noted  . Itchy eyes 02/16/2016  . MDD (major depressive disorder), recurrent severe, without psychosis ([place]) 02/11/2016  . Disruptive mood dysregulation disorder ([place]) 06/10/2014  . 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 ODD (oppositional defiant disorder) 06/10/2014  3:27 PM, 11/21/19 Jerene Pitch, DPT Physical Therapy with [facility]  [phone] office  [place] [address] [place], [place], [address] Phone: [phone]   Fax:  [phone]  Name: Martisha Toulouse MRN: [id number] Date of Birth: 2000/11/05

## 2019-11-23 ENCOUNTER — Ambulatory Visit (HOSPITAL_COMMUNITY): Payer: Medicaid Other | Admitting: Physical Therapy

## 2019-11-28 ENCOUNTER — Ambulatory Visit (HOSPITAL_COMMUNITY): Payer: Medicaid Other | Admitting: Physical Therapy

## 2019-11-30 ENCOUNTER — Ambulatory Visit (HOSPITAL_COMMUNITY): Payer: Medicaid Other | Admitting: Physical Therapy

## 2019-12-07 ENCOUNTER — Encounter (HOSPITAL_COMMUNITY): Payer: Medicaid Other | Admitting: Physical Therapy

## 2020-02-11 ENCOUNTER — Emergency Department (HOSPITAL_COMMUNITY)
Admission: EM | Admit: 2020-02-11 | Discharge: 2020-02-12 | Disposition: A | Discharge status: Home / Self Care | Payer: Medicaid Other | Attending: Emergency Medicine | Admitting: Emergency Medicine

## 2020-02-11 ENCOUNTER — Other Ambulatory Visit: Payer: Self-pay

## 2020-02-11 DIAGNOSIS — R002 Palpitations: Secondary | ICD-10-CM

## 2020-02-11 DIAGNOSIS — F1290 Cannabis use, unspecified, uncomplicated: Secondary | ICD-10-CM | POA: Insufficient documentation

## 2020-02-11 DIAGNOSIS — Z79899 Other long term (current) drug therapy: Secondary | ICD-10-CM | POA: Insufficient documentation

## 2020-02-11 DIAGNOSIS — R112 Nausea with vomiting, unspecified: Secondary | ICD-10-CM | POA: Diagnosis not present

## 2020-02-11 NOTE — ED Triage Notes (Signed)
Pt reports nausea and vomiting for "several days." Pt reports that tonight she ate an edible and her heart began to race and "I had a several non-epileptic seizures."

## 2020-02-12 ENCOUNTER — Encounter (HOSPITAL_COMMUNITY): Payer: Self-pay | Admitting: Emergency Medicine

## 2020-02-12 ENCOUNTER — Other Ambulatory Visit: Payer: Self-pay

## 2020-02-12 LAB — CBC WITH DIFFERENTIAL/PLATELET
Abs Immature Granulocytes: 0.01 10*3/uL (ref 0.00–0.07)
Basophils Absolute: 0 10*3/uL (ref 0.0–0.1)
Basophils Relative: 0 %
Eosinophils Absolute: 0 10*3/uL (ref 0.0–0.5)
Eosinophils Relative: 1 %
HCT: 37.2 % (ref 36.0–46.0)
Hemoglobin: 13.1 g/dL (ref 12.0–15.0)
Immature Granulocytes: 0 %
Lymphocytes Relative: 44 %
Lymphs Abs: 2.5 10*3/uL (ref 0.7–4.0)
MCH: 30.9 pg (ref 26.0–34.0)
MCHC: 35.2 g/dL (ref 30.0–36.0)
MCV: 87.7 fL (ref 80.0–100.0)
Monocytes Absolute: 0.6 10*3/uL (ref 0.1–1.0)
Monocytes Relative: 10 %
Neutro Abs: 2.5 10*3/uL (ref 1.7–7.7)
Neutrophils Relative %: 45 %
Platelets: 284 10*3/uL (ref 150–400)
RBC: 4.24 MIL/uL (ref 3.87–5.11)
RDW: 11.4 % — ABNORMAL LOW (ref 11.5–15.5)
WBC: 5.7 10*3/uL (ref 4.0–10.5)
nRBC: 0 % (ref 0.0–0.2)

## 2020-02-12 LAB — BASIC METABOLIC PANEL
Anion gap: 7 (ref 5–15)
BUN: 16 mg/dL (ref 6–20)
CO2: 24 mmol/L (ref 22–32)
Calcium: 9.7 mg/dL (ref 8.9–10.3)
Chloride: 108 mmol/L (ref 98–111)
Creatinine, Ser: 0.9 mg/dL (ref 0.44–1.00)
GFR calc Af Amer: >60 mL/min (ref >60)
GFR calc non Af Amer: >60 mL/min (ref >60)
Glucose, Bld: 97 mg/dL (ref 70–99)
Potassium: 3.7 mmol/L (ref 3.5–5.1)
Sodium: 139 mmol/L (ref 135–145)

## 2020-02-12 MED ORDER — SODIUM CHLORIDE 0.9 % IV BOLUS
1000.0000 mL | Freq: Once | INTRAVENOUS | Status: AC
  Administered 2020-02-12: 1000 mL via INTRAVENOUS

## 2020-02-12 NOTE — Discharge Instructions (Addendum)
You were seen today for palpitations after eating an edible.  You should avoid illicit drugs.  This is likely the cause of your symptoms.

## 2020-02-12 NOTE — ED Provider Notes (Signed)
[facility] EMERGENCY DEPARTMENT Provider Note   CSN: [id number] Arrival date & time: 02/11/20  2311     History Chief Complaint  Patient presents with  . Emesis    Sandy Carter is a 19 y.o. female.  HPI     This is a 19 year old female with history of anxiety who presents with palpitations and nausea.  Patient reports that she ate an edible marijuana this evening.  She states that she has never had 1 before.  She states she had onset of palpitations and nausea and vomiting.  Denies abdominal pain.  States that symptoms have somewhat improved but she still feels palpitations.  No chest pain.  No recent illnesses.  Denies any other alcohol or drug use.  Patient took a hydroxyzine prior to arrival with some relief of symptoms.  Past Medical History:  Diagnosis Date  . ADD (attention deficit disorder)    Borderline  . Anxiety   . Depression   . ODD (oppositional defiant disorder)     Patient Active Problem List   Diagnosis Date Noted  . Itchy eyes 02/16/2016  . MDD (major depressive disorder), recurrent severe, without psychosis ([place]) 02/11/2016  . Disruptive mood dysregulation disorder ([place]) 06/10/2014  . 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 ODD (oppositional defiant disorder) 06/10/2014    History reviewed. No pertinent surgical history.   OB History   No obstetric history on file.     Family History  Adopted: Yes    Social History   Tobacco Use  . Smoking status: Never Smoker  . Smokeless tobacco: Never Used  Substance Use Topics  . Alcohol use: No  . Drug use: No    Comment: mother found 3 bottles of pills in her bookbag    Home Medications Prior to Admission medications   Medication Sig Start Date End Date Taking? Authorizing Provider  ARIPiprazole (ABILIFY) 5 MG tablet Take 1 tablet (5 mg total) by mouth 2 (two) times daily. 02/20/16   Mordecai Maes, NP  etonogestrel (NEXPLANON) 68 MG IMPL implant 1 each by Subdermal  route once. Implanted 02/05/16    [provider]  FLUoxetine (PROZAC) 40 MG capsule Take 1 capsule (40 mg total) by mouth daily. 02/20/16   Mordecai Maes, NP  hydrOXYzine (ATARAX/VISTARIL) 25 MG tablet Take 1 tablet (25 mg total) by mouth every 6 (six) hours as needed for anxiety. 02/20/16   Mordecai Maes, NP  lamoTRIgine (LAMICTAL) 200 MG tablet Take 1 tablet (200 mg total) by mouth every evening. 02/20/16   Mordecai Maes, NP  prazosin (MINIPRESS) 1 MG capsule Take 1 capsule (1 mg total) by mouth at bedtime. 02/20/16   Mordecai Maes, NP    Allergies    Penicillins  Review of Systems   Review of Systems  Constitutional: Negative for fever.  Respiratory: Negative for shortness of breath.   Cardiovascular: Positive for palpitations. Negative for chest pain and leg swelling.  Gastrointestinal: Positive for nausea and vomiting.  Genitourinary: Negative for dysuria.  All other systems reviewed and are negative.   Physical Exam Updated Vital Signs BP (!) 139/93 (BP Location: Right Arm)   Pulse (!) 143   Temp 100.1 F (37.8 C) (Oral)   Resp 18   Ht 1.575 m (5\' 2" )   Wt 53.5 kg   SpO2 100%   BMI 21.58 kg/m   Physical Exam Vitals and nursing note reviewed.  Constitutional:      Appearance: She is well-developed.     Comments:  Anxious appearing, no acute distress  HENT:     Head: Normocephalic and atraumatic.     Nose: Nose normal.     Mouth/Throat:     Mouth: Mucous membranes are moist.  Eyes:     Pupils: Pupils are equal, round, and reactive to light.  Cardiovascular:     Rate and Rhythm: Regular rhythm. Tachycardia present.     Heart sounds: Normal heart sounds.  Pulmonary:     Effort: Pulmonary effort is normal. No respiratory distress.     Breath sounds: No wheezing.  Abdominal:     General: Bowel sounds are normal.     Palpations: Abdomen is soft.     Tenderness: There is no abdominal tenderness.  Musculoskeletal:     Cervical back: Neck supple.      Right lower leg: No edema.     Left lower leg: No edema.  Skin:    General: Skin is warm and dry.  Neurological:     Mental Status: She is alert and oriented to person, place, and time.  Psychiatric:        Mood and Affect: Mood normal.     ED Results / Procedures / Treatments   Labs (all labs ordered are listed, but only abnormal results are displayed) Labs Reviewed  CBC WITH DIFFERENTIAL/PLATELET - Abnormal; Notable for the following components:      Result Value   RDW 11.4 (*)    All other components within normal limits  BASIC METABOLIC PANEL  URINALYSIS, ROUTINE W REFLEX MICROSCOPIC  PREGNANCY, URINE    EKG EKG Interpretation  Date/Time:  Sunday February 12 2020 00:48:22 EDT Ventricular Rate:  124 PR Interval:    QRS Duration: 73 QT Interval:  296 QTC Calculation: 426 R Axis:   69 Text Interpretation: Sinus tachycardia Probable left atrial enlargement RSR' in V1 or V2, probably normal variant Borderline T abnormalities, anterior leads Confirmed by Ross Marcus ([id number]) on 02/12/2020 1:11:23 AM   Radiology No results found.  Procedures Procedures (including critical care time)  Medications Ordered in ED Medications  sodium chloride 0.9 % bolus 1,000 mL (0 mLs Intravenous Stopped 02/12/20 0218)    ED Course  I have reviewed the triage vital signs and the nursing notes.  Pertinent labs & imaging results that were available during my care of the patient were reviewed by me and considered in my medical decision making (see chart for details).    MDM Rules/Calculators/[name]                       Patient presents with palpitations after taking an edible marijuana.  She is overall nontoxic-appearing.  Tachycardic in the 140s.  Denies drug use.  EKG without ischemic changes.  Shows sinus tachycardia.  Basic lab work obtained.  No significant metabolic derangements and CBC is within normal limits.  Patient is requesting discharge.  I have requested nursing update her  vital signs.  Bedside her vital signs are now in the low 100s.  She is awake, alert, oriented.  Suspect her symptoms are related to ingestion of edible marijuana.  After history, exam, and medical workup I feel the patient has been appropriately medically screened and is safe for discharge home. Pertinent diagnoses were discussed with the patient. Patient was given return precautions.   Final Clinical Impression(s) / ED Diagnoses Final diagnoses:  Palpitations  Marijuana use    Rx / DC Orders ED Discharge Orders    None  Shon Baton, MD 02/12/20 [phone]

## 2020-02-19 ENCOUNTER — Emergency Department (HOSPITAL_COMMUNITY)
Admission: EM | Admit: 2020-02-19 | Discharge: 2020-02-20 | Disposition: A | Discharge status: Home / Self Care | Payer: Medicaid Other | Attending: Emergency Medicine | Admitting: Emergency Medicine

## 2020-02-19 ENCOUNTER — Other Ambulatory Visit: Payer: Self-pay

## 2020-02-19 ENCOUNTER — Encounter (HOSPITAL_COMMUNITY): Payer: Self-pay | Admitting: Emergency Medicine

## 2020-02-19 DIAGNOSIS — Z880 Allergy status to penicillin: Secondary | ICD-10-CM | POA: Insufficient documentation

## 2020-02-19 DIAGNOSIS — R110 Nausea: Secondary | ICD-10-CM

## 2020-02-19 DIAGNOSIS — N390 Urinary tract infection, site not specified: Secondary | ICD-10-CM

## 2020-02-19 DIAGNOSIS — R319 Hematuria, unspecified: Secondary | ICD-10-CM | POA: Insufficient documentation

## 2020-02-19 DIAGNOSIS — F909 Attention-deficit hyperactivity disorder, unspecified type: Secondary | ICD-10-CM | POA: Diagnosis not present

## 2020-02-19 NOTE — ED Triage Notes (Signed)
Patient states nausea and vomiting and states that she think she may be pregnant. Patient states she is on the Depo shot. Patient states nausea and vomiting has been ongoing over the last week.

## 2020-02-20 LAB — COMPREHENSIVE METABOLIC PANEL WITH GFR
ALT: 14 U/L (ref 0–44)
AST: 17 U/L (ref 15–41)
Albumin: 4.9 g/dL (ref 3.5–5.0)
Alkaline Phosphatase: 52 U/L (ref 38–126)
Anion gap: 9 (ref 5–15)
BUN: 17 mg/dL (ref 6–20)
CO2: 22 mmol/L (ref 22–32)
Calcium: 9.4 mg/dL (ref 8.9–10.3)
Chloride: 108 mmol/L (ref 98–111)
Creatinine, Ser: 0.84 mg/dL (ref 0.44–1.00)
GFR calc Af Amer: >60 mL/min
GFR calc non Af Amer: >60 mL/min
Glucose, Bld: 89 mg/dL (ref 70–99)
Potassium: 4.4 mmol/L (ref 3.5–5.1)
Sodium: 139 mmol/L (ref 135–145)
Total Bilirubin: 1.3 mg/dL — ABNORMAL HIGH (ref 0.3–1.2)
Total Protein: 7.7 g/dL (ref 6.5–8.1)

## 2020-02-20 LAB — CBC
HCT: 38.4 % (ref 36.0–46.0)
Hemoglobin: 13.2 g/dL (ref 12.0–15.0)
MCH: 30.7 pg (ref 26.0–34.0)
MCHC: 34.4 g/dL (ref 30.0–36.0)
MCV: 89.3 fL (ref 80.0–100.0)
Platelets: 260 10*3/uL (ref 150–400)
RBC: 4.3 MIL/uL (ref 3.87–5.11)
RDW: 11.5 % (ref 11.5–15.5)
WBC: 5.7 10*3/uL (ref 4.0–10.5)
nRBC: 0 % (ref 0.0–0.2)

## 2020-02-20 LAB — URINALYSIS, ROUTINE W REFLEX MICROSCOPIC
Bilirubin Urine: NEGATIVE
Glucose, UA: NEGATIVE mg/dL
Ketones, ur: 5 mg/dL — AB
Leukocytes,Ua: NEGATIVE
Nitrite: POSITIVE — AB
Protein, ur: NEGATIVE mg/dL
Specific Gravity, Urine: 1.024 (ref 1.005–1.030)
WBC, UA: >50 WBC/hpf — ABNORMAL HIGH (ref 0–5)
pH: 6 (ref 5.0–8.0)

## 2020-02-20 LAB — HCG, QUANTITATIVE, PREGNANCY: hCG, Beta Chain, Quant, S: <1 m[IU]/mL (ref <5)

## 2020-02-20 LAB — LIPASE, BLOOD: Lipase: 28 U/L (ref 11–51)

## 2020-02-20 MED ORDER — SULFAMETHOXAZOLE-TRIMETHOPRIM 800-160 MG PO TABS: 1.0000 | ORAL_TABLET | Freq: Two times a day (BID) | ORAL | 0 refills | Status: DC

## 2020-02-20 MED ORDER — ONDANSETRON HCL 4 MG PO TABS: 4.0000 mg | ORAL_TABLET | Freq: Three times a day (TID) | ORAL | 0 refills | Status: DC | PRN

## 2020-02-20 MED ORDER — SULFAMETHOXAZOLE-TRIMETHOPRIM 800-160 MG PO TABS
1.0000 | ORAL_TABLET | Freq: Once | ORAL | Status: AC
  Administered 2020-02-20: 1 via ORAL
  Filled 2020-02-20: qty 1

## 2020-02-20 NOTE — Discharge Instructions (Addendum)
Your blood pregnancy test is negative.  You do have a urinary tract infection.  Drink plenty of fluids.  Use the Zofran for nausea.  Take the antibiotic until gone.  Recheck if you get fever, have uncontrolled vomiting or worsening pain in your flank area.  Please follow-up with your primary care or your OB/GYN if you are not improving over the next week.

## 2020-02-20 NOTE — ED Provider Notes (Signed)
[facility] EMERGENCY DEPARTMENT Provider Note   CSN: [id number] Arrival date & time: 02/19/20  2259   Time seen 12:55 AM  History Chief Complaint  Patient presents with  . Emesis    Sandy Carter is a 19 y.o. female.  HPI   Patient is G0 P0, states her last normal period was about a month ago however they are very irregular.  She states she has been tired and having nausea and acne with abdominal bloating for a few weeks.  She had some dysuria today.  She has had some light spotting for 1 to 2 weeks.  She had some cramping in her lower abdomen 1 to 2 weeks ago.  She has some left flank pain that slightly sharp at times and she gets it a few times a day for the past few days.  She has nausea daily but not vomiting.  She has had several home pregnancy tests that are negative.  She states nothing that is different tonight that prompted her ED visit.  PCP [facility]  [place] [facility] Conard Novak   Past Medical History:  Diagnosis Date  . ADD (attention deficit disorder)    Borderline  . Anxiety   . Depression   . ODD (oppositional defiant disorder)     Patient Active Problem List   Diagnosis Date Noted  . Itchy eyes 02/16/2016  . MDD (major depressive disorder), recurrent severe, 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  . Disruptive mood dysregulation disorder (HCC) 06/10/2014  . 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 ODD (oppositional defiant disorder) 06/10/2014    History reviewed. No pertinent surgical history.   OB History   No obstetric history on file.     Family History  Adopted: Yes    Social History   Tobacco Use  . Smoking status: Never Smoker  . Smokeless tobacco: Never Used  Substance Use Topics  . Alcohol use: No  . Drug use: No    Comment: mother found 3 bottles of pills in her bookbag    Home Medications Prior to Admission medications   Medication Sig Start Date End Date Taking? Authorizing Provider    ARIPiprazole (ABILIFY) 5 MG tablet Take 1 tablet (5 mg total) by mouth 2 (two) times daily. 02/20/16   Denzil Magnuson, NP  etonogestrel (NEXPLANON) 68 MG IMPL implant 1 each by Subdermal route once. Implanted 02/05/16    [provider]  FLUoxetine (PROZAC) 40 MG capsule Take 1 capsule (40 mg total) by mouth daily. 02/20/16   Denzil Magnuson, NP  hydrOXYzine (ATARAX/VISTARIL) 25 MG tablet Take 1 tablet (25 mg total) by mouth every 6 (six) hours as needed for anxiety. 02/20/16   Denzil Magnuson, NP  lamoTRIgine (LAMICTAL) 200 MG tablet Take 1 tablet (200 mg total) by mouth every evening. 02/20/16   Denzil Magnuson, NP  ondansetron (ZOFRAN) 4 MG tablet Take 1 tablet (4 mg total) by mouth every 8 (eight) hours as needed for nausea or vomiting. 02/20/20   Devoria Albe, MD  prazosin (MINIPRESS) 1 MG capsule Take 1 capsule (1 mg total) by mouth at bedtime. 02/20/16   Denzil Magnuson, NP  sulfamethoxazole-trimethoprim (BACTRIM DS) 800-160 MG tablet Take 1 tablet by mouth 2 (two) times daily. 02/20/20   Devoria Albe, MD    Allergies    Penicillins  Review of Systems   Review of Systems  All other systems reviewed and are negative.   Physical Exam Updated Vital Signs BP 132/81 (BP Location:  Right Arm)   Pulse (!) 113   Temp 98.4 F (36.9 C) (Oral)   Resp 18   Ht 5\' 2"  (1.575 m)   Wt 54.9 kg   LMP  (LMP Unknown)   SpO2 100%   BMI 22.15 kg/m   Physical Exam Vitals and nursing note reviewed.  Constitutional:      General: She is not in acute distress.    Appearance: Normal appearance. She is normal weight.  HENT:     Head: Normocephalic and atraumatic.     Right Ear: External ear normal.     Left Ear: External ear normal.     Nose: Nose normal.     Mouth/Throat:     Mouth: Mucous membranes are moist.     Pharynx: No oropharyngeal exudate or posterior oropharyngeal erythema.  Eyes:     Extraocular Movements: Extraocular movements intact.     Conjunctiva/sclera: Conjunctivae  normal.     Pupils: Pupils are equal, round, and reactive to light.  Cardiovascular:     Rate and Rhythm: Normal rate and regular rhythm.     Pulses: Normal pulses.     Heart sounds: No murmur heard.   Pulmonary:     Effort: Pulmonary effort is normal. No respiratory distress.     Breath sounds: Normal breath sounds.  Abdominal:     General: Abdomen is flat. Bowel sounds are normal.     Palpations: Abdomen is soft.     Tenderness: There is no abdominal tenderness. There is no right CVA tenderness or left CVA tenderness.  Musculoskeletal:        General: Normal range of motion.     Cervical back: Normal range of motion.  Skin:    General: Skin is warm and dry.  Neurological:     General: No focal deficit present.     Mental Status: She is alert and oriented to person, place, and time.     Cranial Nerves: No cranial nerve deficit.  Psychiatric:        Mood and Affect: Mood normal.        Behavior: Behavior normal.        Thought Content: Thought content normal.     ED Results / Procedures / Treatments   Labs (all labs ordered are listed, but only abnormal results are displayed) Results for orders placed or performed during the hospital encounter of 02/19/20  Urinalysis, Routine w reflex microscopic  Result Value Ref Range   Color, Urine YELLOW YELLOW   APPearance HAZY (A) CLEAR   Specific Gravity, Urine 1.024 1.005 - 1.030   pH 6.0 5.0 - 8.0   Glucose, UA NEGATIVE NEGATIVE mg/dL   Hgb urine dipstick MODERATE (A) NEGATIVE   Bilirubin Urine NEGATIVE NEGATIVE   Ketones, ur 5 (A) NEGATIVE mg/dL   Protein, ur NEGATIVE NEGATIVE mg/dL   Nitrite POSITIVE (A) NEGATIVE   Leukocytes,Ua NEGATIVE NEGATIVE   RBC / HPF 0-5 0 - 5 RBC/hpf   WBC, UA >50 (H) 0 - 5 WBC/hpf   Bacteria, UA MANY (A) NONE SEEN   Squamous Epithelial / LPF 0-5 0 - 5   Mucus PRESENT   Lipase, blood  Result Value Ref Range   Lipase 28 11 - 51 U/L  Comprehensive metabolic panel  Result Value Ref Range    Sodium 139 135 - 145 mmol/L   Potassium 4.4 3.5 - 5.1 mmol/L   Chloride 108 98 - 111 mmol/L   CO2 22 22 - 32 mmol/L  Glucose, Bld 89 70 - 99 mg/dL   BUN 17 6 - 20 mg/dL   Creatinine, Ser 6.37 0.44 - 1.00 mg/dL   Calcium 9.4 8.9 - 85.8 mg/dL   Total Protein 7.7 6.5 - 8.1 g/dL   Albumin 4.9 3.5 - 5.0 g/dL   AST 17 15 - 41 U/L   ALT 14 0 - 44 U/L   Alkaline Phosphatase 52 38 - 126 U/L   Total Bilirubin 1.3 (H) 0.3 - 1.2 mg/dL   GFR calc non Af Amer >60 >60 mL/min   GFR calc Af Amer >60 >60 mL/min   Anion gap 9 5 - 15  CBC  Result Value Ref Range   WBC 5.7 4.0 - 10.5 K/uL   RBC 4.30 3.87 - 5.11 MIL/uL   Hemoglobin 13.2 12.0 - 15.0 g/dL   HCT 85.0 36 - 46 %   MCV 89.3 80.0 - 100.0 fL   MCH 30.7 26.0 - 34.0 pg   MCHC 34.4 30.0 - 36.0 g/dL   RDW 27.7 41.2 - 87.8 %   Platelets 260 150 - 400 K/uL   nRBC 0.0 0.0 - 0.2 %  hCG, quantitative, pregnancy  Result Value Ref Range   hCG, Beta Chain, Quant, S <1 <5 mIU/mL   Laboratory interpretation all normal except UTI    EKG None  Radiology No results found.  Procedures Procedures (including critical care time)  Medications Ordered in ED Medications  sulfamethoxazole-trimethoprim (BACTRIM DS) 800-160 MG per tablet 1 tablet (has no administration in time range)    ED Course  I have reviewed the triage vital signs and the nursing notes.  Pertinent labs & imaging results that were available during my care of the patient were reviewed by me and considered in my medical decision making (see chart for details).    MDM Rules/Calculators/[name]                           Patient's pregnancy test is negative.  She is has penicillin allergy, she was started on Septra DS for her urinary tract infection.  She can follow-up with her OB/GYN or her primary care doctor.  She was given a prescriptions for Zofran for her nausea.   Final Clinical Impression(s) / ED Diagnoses Final diagnoses:  Urinary tract infection with hematuria, site  unspecified  Nausea    Rx / DC Orders ED Discharge Orders         Ordered    sulfamethoxazole-trimethoprim (BACTRIM DS) 800-160 MG tablet  2 times daily     Discontinue  Reprint     02/20/20 0206    ondansetron (ZOFRAN) 4 MG tablet  Every 8 hours PRN     Discontinue  Reprint     02/20/20 0206         Plan discharge  Devoria Albe, MD, Concha Pyo, MD 02/20/20 [phone]

## 2020-02-23 LAB — URINE CULTURE: Culture: >=100000 — AB

## 2020-02-24 ENCOUNTER — Telehealth: Payer: Self-pay

## 2020-02-24 NOTE — Telephone Encounter (Signed)
Post ED Visit - Positive Culture Follow-up: Unsuccessful Patient Follow-up  Culture assessed and recommendations reviewed by:  []  , Pharm.D. []  Enzo Bi, .D., BCPS AQ-ID []  Celedonio Miyamoto, Pharm.D., BCPS []  [address], Pharm.D., BCPS []  [place], Garvin Fila.D., BCPS, AAHIVP []  , Pharm.D., BCPS, AAHIVP []  Georgina Pillion, PharmD []  , PharmD, BCPS [place] D Positive urine culture  []  Patient discharged without antimicrobial prescription and treatment is now indicated [x]  Organism is resistant to prescribed ED discharge antimicrobial []  Patient with positive blood cultures Needs Macrobid 100 mg BID x 5 days  Stop Bactrim per [address] [facility]  Unable to contact patient after 3 attempts, letter will be sent to address on file  Estella Husk 02/24/2020, 11:51 AM

## 2020-03-06 ENCOUNTER — Telehealth: Payer: Self-pay | Admitting: Emergency Medicine

## 2020-04-14 ENCOUNTER — Ambulatory Visit
Admission: EM | Admit: 2020-04-14 | Discharge: 2020-04-14 | Disposition: A | Discharge status: Home / Self Care | Payer: Medicaid Other | Attending: Emergency Medicine | Admitting: Emergency Medicine

## 2020-04-14 ENCOUNTER — Encounter: Payer: Self-pay | Admitting: Emergency Medicine

## 2020-04-14 ENCOUNTER — Other Ambulatory Visit: Payer: Self-pay

## 2020-04-14 DIAGNOSIS — N3000 Acute cystitis without hematuria: Secondary | ICD-10-CM | POA: Diagnosis present

## 2020-04-14 DIAGNOSIS — R109 Unspecified abdominal pain: Secondary | ICD-10-CM | POA: Insufficient documentation

## 2020-04-14 LAB — POCT URINE PREGNANCY: Preg Test, Ur: NEGATIVE

## 2020-04-14 LAB — POCT URINALYSIS DIP (MANUAL ENTRY)
Bilirubin, UA: NEGATIVE
Blood, UA: NEGATIVE
Glucose, UA: NEGATIVE mg/dL
Ketones, POC UA: NEGATIVE mg/dL
Leukocytes, UA: NEGATIVE
Nitrite, UA: POSITIVE — AB
Protein Ur, POC: NEGATIVE mg/dL
Spec Grav, UA: 1.02 (ref 1.010–1.025)
Urobilinogen, UA: 2 E.U./dL — AB
pH, UA: 7.5 (ref 5.0–8.0)

## 2020-04-14 MED ORDER — CIPROFLOXACIN HCL 500 MG PO TABS: 500.0000 mg | ORAL_TABLET | Freq: Two times a day (BID) | ORAL | 0 refills | Status: DC

## 2020-04-14 MED ORDER — ONDANSETRON HCL 4 MG PO TABS: 4.0000 mg | ORAL_TABLET | Freq: Four times a day (QID) | ORAL | 0 refills | Status: DC

## 2020-04-14 NOTE — ED Provider Notes (Signed)
[facility]   CC: Abdominal pain  SUBJECTIVE:  Sandy Carter is a 19 y.o. female who complains of constant lower abdominal cramping, lower back pain, and vomiting x 5 days.  Had a UTI 1 month ago, but did not take antibiotics as prescribed.  Localizes the pain to the lower abdomen.  Pain is constant and describes it as cramping.  Has tried OTC medications without relief.  Denies aggravating factors.  Denies similar symptoms in the past.  Complains of nausea, vomiting.  Denies fever, chills, flank pain, vaginal discharge, vaginal itching, vaginal irritation, dysuria, urinary frequency, urinary urgency.    LMP: No LMP recorded. Patient has had an injection.  ROS: As in HPI.  All other pertinent ROS negative.     Past Medical History:  Diagnosis Date  . ADD (attention deficit disorder)    Borderline  . Anxiety   . Depression   . ODD (oppositional defiant disorder)    History reviewed. No pertinent surgical history. Allergies  Allergen Reactions  . Penicillins Rash   No current facility-administered medications on file prior to encounter.   Current Outpatient Medications on File Prior to Encounter  Medication Sig Dispense Refill  . [DISCONTINUED] ARIPiprazole (ABILIFY) 5 MG tablet Take 1 tablet (5 mg total) by mouth 2 (two) times daily. 60 tablet 0  . [DISCONTINUED] etonogestrel (NEXPLANON) 68 MG IMPL implant 1 each by Subdermal route once. Implanted 02/05/16    . [DISCONTINUED] FLUoxetine (PROZAC) 40 MG capsule Take 1 capsule (40 mg total) by mouth daily. 30 capsule 0  . [DISCONTINUED] lamoTRIgine (LAMICTAL) 200 MG tablet Take 1 tablet (200 mg total) by mouth every evening. 30 tablet 0  . [DISCONTINUED] prazosin (MINIPRESS) 1 MG capsule Take 1 capsule (1 mg total) by mouth at bedtime. 30 capsule 0   Social History   Socioeconomic History  . Marital status: Single    Spouse name: Not on file  . Number of children: Not on file  . Years of education: Not on file  .  Highest education level: Not on file  Occupational History  . Not on file  Tobacco Use  . Smoking status: Never Smoker  . Smokeless tobacco: Never Used  Substance and Sexual Activity  . Alcohol use: No  . Drug use: No    Comment: mother found 3 bottles of pills in her bookbag  . Sexual activity: Never    Birth control/protection: Implant  Other Topics Concern  . Not on file  Social History Narrative  . Not on file   Social Determinants of Health   Financial Resource Strain:   . Difficulty of Paying Living Expenses:   Food Insecurity:   . Worried About Programme researcher, broadcasting/film/video in the Last Year:   . Barista in the Last Year:   Transportation Needs:   . Freight forwarder (Medical):   Marland Kitchen Lack of Transportation (Non-Medical):   Physical Activity:   . Days of Exercise per Week:   . Minutes of Exercise per Session:   Stress:   . Feeling of Stress :   Social Connections:   . Frequency of Communication with Friends and Family:   . Frequency of Social Gatherings with Friends and Family:   . Attends Religious Services:   . Active Member of Clubs or Organizations:   . Attends Banker Meetings:   Marland Kitchen Marital Status:   Intimate Partner Violence:   . Fear of Current or Ex-Partner:   . Emotionally Abused:   .  Physically Abused:   . Sexually Abused:    Family History  Adopted: Yes    OBJECTIVE:  Vitals:   04/14/20 1513 04/14/20 1515  BP:  111/76  Pulse:  90  Resp:  17  Temp:  98.9 F (37.2 C)  TempSrc:  Oral  SpO2:  98%  Weight: 117 lb (53.1 kg)   Height: 5\' 2"  (1.575 m)    General appearance: Alert in no acute distress HEENT: NCAT.  Oropharynx clear.  Lungs: clear to auscultation bilaterally without adventitious breath sounds Heart: regular rate and rhythm.   Abdomen: soft; non-distended; diffusely TTP over lower abdomen; bowel sounds present; no guarding Back: no CVA tenderness Extremities: no edema; symmetrical with no gross deformities Skin:  warm and dry Neurologic: Ambulates from chair to exam table without difficulty Psychological: alert and cooperative; normal mood and affect  Labs Reviewed  POCT URINALYSIS DIP (MANUAL ENTRY) - Abnormal; Notable for the following components:      Result Value   Urobilinogen, UA 2.0 (*)    Nitrite, UA Positive (*)    All other components within normal limits  URINE CULTURE  POCT URINE PREGNANCY    ASSESSMENT & PLAN:  1. Abdominal pain, unspecified abdominal location   2. Acute cystitis without hematuria    Meds ordered this encounter  Medications  . ciprofloxacin (CIPRO) 500 MG tablet    Sig: Take 1 tablet (500 mg total) by mouth every 12 (twelve) hours for 10 days.    Dispense:  20 tablet    Refill:  0    Order Specific Question:   Supervising Provider    Answer:   Eustace Moore  . ondansetron (ZOFRAN) 4 MG tablet    Sig: Take 1 tablet (4 mg total) by mouth every 6 (six) hours.    Dispense:  12 tablet    Refill:  0    Order Specific Question:   Supervising Provider    Answer:   [[id number]] Eustace Moore   Discussed extensively with patient that we could not rule out appendicitis, ovarian torsion, TOA, ovarian cysts, diverticulitis, ectopic pregnancy, fibroid, endometriosis, etc... in office and she should go to the ED if she was concerned for any of these diagnoses.  Patient is agreeable to outpatient therapy first.    Offered patient further evaluation and management in the ED.  Patient declines at this time and would like to try outpatient therapy first.  Aware of the risk associated with this decision including missed diagnosis, organ damage, organ failure, and/or death.  Patient aware and in agreement.    Urine culture sent.  We will call you with the results.   Push fluids and get plenty of rest.   Take antibiotic as directed and to completion Zofran for nausea Follow up with PCP if symptoms persists Return here or go to ER if you have any new or  worsening symptoms such as fever, worsening abdominal pain, nausea/vomiting, flank pain, etc...  Outlined signs and symptoms indicating need for more acute intervention. Patient verbalized understanding. After Visit Summary given.        [[id number]], PA-C 04/14/20 1555

## 2020-04-14 NOTE — Discharge Instructions (Signed)
Unable to rule out pyelonephritis or sepsis in urgent care setting.  Offered patient further evaluation and management in the ED.  Patient declines at this time and would like to try outpatient therapy first.  Aware of the risk associated with this decision including missed diagnosis, organ damage, organ failure, and/or death.  Patient aware and in agreement.     Urine culture sent.  We will call you with the results.   Push fluids and get plenty of rest.   Take antibiotic as directed and to completion Zofran for nausea Follow up with PCP if symptoms persists Return here or go to ER if you have any new or worsening symptoms such as fever, worsening abdominal pain, nausea/vomiting, flank pain, etc..Sandy Carter

## 2020-04-14 NOTE — ED Triage Notes (Signed)
Pt states she is having lower abd cramping, lower back pain and vomiting since Monday. Pt states she had a uti x 1 month ago.  Pt states she never picked up the abx because she just got too busy to go get them.

## 2020-04-15 ENCOUNTER — Telehealth: Payer: Self-pay

## 2020-04-15 MED ORDER — CIPROFLOXACIN HCL 500 MG PO TABS: 500.0000 mg | ORAL_TABLET | Freq: Two times a day (BID) | ORAL | 0 refills | Status: AC

## 2020-04-16 LAB — URINE CULTURE: Culture: >=100000 — AB

## 2020-05-07 ENCOUNTER — Encounter (HOSPITAL_COMMUNITY): Payer: Self-pay

## 2020-05-07 ENCOUNTER — Emergency Department (HOSPITAL_COMMUNITY)
Admission: EM | Admit: 2020-05-07 | Discharge: 2020-05-08 | Disposition: A | Discharge status: Home / Self Care | Payer: Medicaid Other | Attending: Emergency Medicine | Admitting: Emergency Medicine

## 2020-05-07 ENCOUNTER — Other Ambulatory Visit: Payer: Self-pay

## 2020-05-07 DIAGNOSIS — Z79899 Other long term (current) drug therapy: Secondary | ICD-10-CM | POA: Insufficient documentation

## 2020-05-07 DIAGNOSIS — R102 Pelvic and perineal pain: Secondary | ICD-10-CM | POA: Insufficient documentation

## 2020-05-07 NOTE — ED Notes (Signed)
Waiting in car [phone]

## 2020-05-07 NOTE — ED Triage Notes (Signed)
Pt was recently told she has an ovarian cycst. Thinks she ruptured it today. Complains of nausea emesis, bleeding and pain.

## 2020-05-08 LAB — URINALYSIS, ROUTINE W REFLEX MICROSCOPIC
Bacteria, UA: NONE SEEN
Bilirubin Urine: NEGATIVE
Glucose, UA: NEGATIVE mg/dL
Ketones, ur: 5 mg/dL — AB
Leukocytes,Ua: NEGATIVE
Nitrite: NEGATIVE
Protein, ur: NEGATIVE mg/dL
Specific Gravity, Urine: 1.015 (ref 1.005–1.030)
pH: 5 (ref 5.0–8.0)

## 2020-05-08 LAB — PREGNANCY, URINE: Preg Test, Ur: NEGATIVE

## 2020-05-08 NOTE — Discharge Instructions (Addendum)
Continue to take ibuprofen 600 mg 4 times a day for pain.  In addition you can also add acetaminophen 650 mg 4 times a day.  Please call your OB/GYN doctor in [facility] to be evaluated further for your pelvic problems.  You can call [phone] to schedule an outpatient ultrasound to look at your ovarian cyst.  Since you have stopped your Depo-Provera I would expect you to start having vaginal bleeding.  You did not want to be tested for STDs tonight.

## 2020-05-08 NOTE — ED Notes (Addendum)
rn to room to assist edp with pelvic exam. Pt refused STD testing despite having uterine pain. Pt states there is no way she could have an std since she has only been with one partner ever

## 2020-05-08 NOTE — ED Notes (Signed)
Pt rec'd d/c papers. Pt ready to leave, no vitals taken. Pt educated on effects from discontinuing hormonal birth control. Pt advised to see OB/GYN for specialized "female" care. Pt stated she did not like driving so far to her OB/GYN so she came to ER. Informed pt she is allowed to switch. She states she will look into calling [facility]

## 2020-05-08 NOTE — ED Provider Notes (Signed)
[facility] EMERGENCY DEPARTMENT Provider Note   CSN: [id number] Arrival date & time: 05/07/20  2203   Time seen 3:20 AM  History Chief Complaint  Patient presents with  . Pelvic Pain    Sandy Carter is a 19 y.o. female.  HPI   Patient states she has been on Depo-Provera and has not had a menses in many years.  She was due to have her injection last week and she did not have it done.  She states for the past month she has been having nausea every day.  She has vomiting once or twice a day but it can skip and be every other day.  She also had left lower quadrant pain.  She was seen in the ED in [place] about 2 weeks ago and had a chest x-ray done and the radiologist read it as possible early pneumonia however she did not have a cough.  She also had a CT done of her abdomen which showed an ovarian cyst.  She states she was treated for UTI with doxycycline.  She also complains of her breast being swollen and hurting.  She states she worked on August 29 and felt lightheaded and had lots of shaking.  On August 30 if she started having some vaginal spotting.  She states her whole left abdomen hurts and now it hurts a little bit on the right.  PCP [facility] in [facility]   Past Medical History:  Diagnosis Date  . ADD (attention deficit disorder)    Borderline  . Anxiety   . Depression   . ODD (oppositional defiant disorder)     Patient Active Problem List   Diagnosis Date Noted  . Itchy eyes 02/16/2016  . MDD (major depressive disorder), recurrent severe, 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  . Disruptive mood dysregulation disorder (HCC) 06/10/2014  . 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 ODD (oppositional defiant disorder) 06/10/2014    History reviewed. No pertinent surgical history.   OB History   No obstetric history on file.     Family History  Adopted: Yes    Social History   Tobacco Use  . Smoking  status: Never Smoker  . Smokeless tobacco: Never Used  Substance Use Topics  . Alcohol use: No  . Drug use: No    Comment: mother found 3 bottles of pills in her bookbag  employed  Home Medications Prior to Admission medications   Medication Sig Start Date End Date Taking? Authorizing Provider  ondansetron (ZOFRAN) 4 MG tablet Take 1 tablet (4 mg total) by mouth every 6 (six) hours. 04/14/20   Wurst, [place], PA-C  ARIPiprazole (ABILIFY) 5 MG tablet Take 1 tablet (5 mg total) by mouth 2 (two) times daily. 02/20/16 04/14/20  Denzil Magnuson, NP  etonogestrel (NEXPLANON) 68 MG IMPL implant 1 each by Subdermal route once. Implanted 02/05/16  04/14/20  [provider]  FLUoxetine (PROZAC) 40 MG capsule Take 1 capsule (40 mg total) by mouth daily. 02/20/16 04/14/20  Denzil Magnuson, NP  lamoTRIgine (LAMICTAL) 200 MG tablet Take 1 tablet (200 mg total) by mouth every evening. 02/20/16 04/14/20  Denzil Magnuson, NP  prazosin (MINIPRESS) 1 MG capsule Take 1 capsule (1 mg total) by mouth at bedtime. 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 She takes Claritin as needed for allergies She has been taking ibuprofen for her pelvic pain Dr. Lynelle Doctor  Allergies  Penicillins  Review of Systems   Review of Systems  All other systems reviewed and are negative.   Physical Exam Updated Vital Signs BP (!) 129/91   Pulse 100   Temp 98.3 F (36.8 C)   Resp 19   Ht 5\' 2"  (1.575 m)   Wt 53.1 kg   SpO2 100%   BMI 21.41 kg/m   Physical Exam Vitals and nursing note reviewed. Exam conducted with a chaperone present.  Constitutional:      General: She is not in acute distress.    Appearance: Normal appearance. She is normal weight.     Comments: When I enter the room patient is sitting in the bedside chair and boyfriend is laying on the stretcher because "I wanted him to be comfortable".  HENT:     Head: Normocephalic and atraumatic.      Right Ear: External ear normal.     Left Ear: External ear normal.  Eyes:     Extraocular Movements: Extraocular movements intact.     Conjunctiva/sclera: Conjunctivae normal.  Cardiovascular:     Rate and Rhythm: Normal rate and regular rhythm.     Pulses: Normal pulses.     Heart sounds: Normal heart sounds.  Pulmonary:     Effort: Pulmonary effort is normal. No respiratory distress.     Breath sounds: Normal breath sounds.  Abdominal:     General: Abdomen is flat. Bowel sounds are normal.     Palpations: Abdomen is soft.     Tenderness: There is no abdominal tenderness.  Genitourinary:    General: Normal vulva.     Comments: Patient has very small amount of chocolate looking blood near the cervix.  On bimanual she is tender over the uterus without chandelier sign.  Her ovaries are nontender bilaterally.  I do not feel any masses. Musculoskeletal:        General: Normal range of motion.     Cervical back: Normal range of motion.  Skin:    General: Skin is warm and dry.  Neurological:     General: No focal deficit present.     Mental Status: She is alert and oriented to person, place, and time.     Cranial Nerves: No cranial nerve deficit.  Psychiatric:        Mood and Affect: Mood normal.        Behavior: Behavior normal.        Thought Content: Thought content normal.     ED Results / Procedures / Treatments   Labs (all labs ordered are listed, but only abnormal results are displayed) Results for orders placed or performed during the hospital encounter of 05/07/20  Pregnancy, urine  Result Value Ref Range   Preg Test, Ur NEGATIVE NEGATIVE  Urinalysis, Routine w reflex microscopic  Result Value Ref Range   Color, Urine YELLOW YELLOW   APPearance CLEAR CLEAR   Specific Gravity, Urine 1.015 1.005 - 1.030   pH 5.0 5.0 - 8.0   Glucose, UA NEGATIVE NEGATIVE mg/dL   Hgb urine dipstick MODERATE (A) NEGATIVE   Bilirubin Urine NEGATIVE NEGATIVE   Ketones, ur 5 (A) NEGATIVE  mg/dL   Protein, ur NEGATIVE NEGATIVE mg/dL   Nitrite NEGATIVE NEGATIVE   Leukocytes,Ua NEGATIVE NEGATIVE   RBC / HPF 0-5 0 - 5 RBC/hpf   WBC, UA 0-5 0 - 5 WBC/hpf   Bacteria, UA NONE SEEN NONE SEEN   Squamous Epithelial / LPF 0-5 0 - 5   Mucus PRESENT  Laboratory interpretation all normal except hematuria consistent with vaginal bleeding    EKG None  Radiology No results found.  Procedures Procedures (including critical care time)  Medications Ordered in ED Medications - No data to display  ED Course  I have reviewed the triage vital signs and the nursing notes.  Pertinent labs & imaging results that were available during my care of the patient were reviewed by me and considered in my medical decision making (see chart for details).    MDM Rules/Calculators/[name]                           I asked patient several times that she wanted to be tested for STDs.  She states she was tested few months ago and she was negative.  She states the boyfriend she is with now is her only sexual contact, she states he has had several others however she does not want to be tested for STDs.  Pelvic exam was done.  We discussed getting outpatient pelvic ultrasound.  She states she has to work and she was advised she could do the ultrasound at her convenience.  She can follow-up with her OB/GYN doctors in [id number] [facility].   Final Clinical Impression(s) / ED Diagnoses Final diagnoses:  None    Rx / DC Orders ED Discharge Orders    None    OTC ibuprofen and acetaminophen  Plan discharge  Devoria Albe, MD, Concha Pyo, MD 05/08/20 [phone]

## 2020-05-29 ENCOUNTER — Other Ambulatory Visit: Payer: Self-pay | Admitting: Family Medicine

## 2020-05-29 ENCOUNTER — Ambulatory Visit (HOSPITAL_COMMUNITY)
Admission: RE | Admit: 2020-05-29 | Discharge: 2020-05-29 | Disposition: A | Discharge status: Home / Self Care | Payer: Medicaid Other | Source: Ambulatory Visit | Attending: Emergency Medicine | Admitting: Emergency Medicine

## 2020-05-29 ENCOUNTER — Other Ambulatory Visit: Payer: Self-pay

## 2020-05-29 ENCOUNTER — Other Ambulatory Visit (HOSPITAL_COMMUNITY): Payer: Self-pay | Admitting: Emergency Medicine

## 2020-05-29 DIAGNOSIS — N83202 Unspecified ovarian cyst, left side: Secondary | ICD-10-CM

## 2020-05-29 DIAGNOSIS — M545 Low back pain, unspecified: Secondary | ICD-10-CM

## 2020-05-29 DIAGNOSIS — G8929 Other chronic pain: Secondary | ICD-10-CM

## 2020-06-08 ENCOUNTER — Ambulatory Visit (HOSPITAL_COMMUNITY)
Admission: RE | Admit: 2020-06-08 | Discharge: 2020-06-08 | Disposition: A | Discharge status: Home / Self Care | Payer: Medicaid Other | Source: Ambulatory Visit | Attending: Family Medicine | Admitting: Family Medicine

## 2020-06-08 ENCOUNTER — Other Ambulatory Visit: Payer: Self-pay

## 2020-06-08 DIAGNOSIS — M545 Low back pain, unspecified: Secondary | ICD-10-CM

## 2020-06-08 DIAGNOSIS — G8929 Other chronic pain: Secondary | ICD-10-CM

## 2020-06-28 ENCOUNTER — Encounter: Payer: Self-pay | Admitting: Orthopaedic Surgery

## 2020-06-28 ENCOUNTER — Other Ambulatory Visit: Payer: Self-pay

## 2020-06-28 ENCOUNTER — Ambulatory Visit (INDEPENDENT_AMBULATORY_CARE_PROVIDER_SITE_OTHER): Payer: Medicaid Other | Admitting: Orthopaedic Surgery

## 2020-06-28 VITALS — BP 99/68 | HR 104 | Ht 62.0 in | Wt 120.0 lb

## 2020-06-28 DIAGNOSIS — M545 Low back pain, unspecified: Secondary | ICD-10-CM | POA: Diagnosis not present

## 2020-06-28 DIAGNOSIS — G589 Mononeuropathy, unspecified: Secondary | ICD-10-CM | POA: Diagnosis not present

## 2020-06-28 DIAGNOSIS — M546 Pain in thoracic spine: Secondary | ICD-10-CM | POA: Diagnosis not present

## 2020-06-28 NOTE — Progress Notes (Signed)
Subjective:    Patient ID: Sandy Carter, female    DOB: [birth year], 19 y.o.   MRN: [id number]  HPI She has had lower back pain for several years.  Two years ago she had epidural injections in [place].  She is from the [place] and adopted.  She has had back pain for over ten years she says.  She had MRI of the lumbar and thoracic spine done 06-08-20.  I have reviewed them.  They were ordered by her family doctor.  MRI of the lumbar spine showed: IMPRESSION: 1. Moderate-sized disc protrusion at L5-S1 resulting in severe right subarticular recess stenosis with mass effect on the descending right S1 nerve root. There is also moderate left subarticular recess stenosis and borderline-mild bilateral foraminal stenosis at this level. 2. Shallow central disc protrusion at L4-L5 with mild bilateral subarticular recess stenosis. 3. No significant canal stenosis at any level.  MRI of the thoracic spine showed: IMPRESSION: 1. Normal MRI of the thoracic spine without foraminal or canal stenosis at any level. 2. Trace bilateral pleural effusions, right slightly greater than left. Dedicated chest radiographs are suggested to assess for an underlying infectious or inflammatory process within the lungs.  She says her family doctor got chest x-rays.  I was prepared to order them.  I do not have notes from the family doctor.  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  I have explained the findings of the MRI.  I will have her see a neurosurgeon.  She is agreeable to this.  I have independently reviewed the MRI.    Her pain radiates to both sides but more to the left side recently.  She has no weakness.  She has pain with activity.   Review of Systems  Constitutional: Positive for activity change.  Musculoskeletal: Positive for arthralgias and back pain.  All other systems reviewed and are negative.  For Review of Systems, all other systems reviewed and are negative.  The  following is a summary of the past history medically, past history surgically, known current medicines, social history and family history.  This information is gathered electronically by the computer from prior information and documentation.  I review this each visit and have found including this information at this point in the chart is beneficial and informative.   Past Medical History:  Diagnosis Date  . ADD (attention deficit disorder)    Borderline  . Anxiety   . Depression   . ODD (oppositional defiant disorder)     History reviewed. No pertinent surgical history.  Current Outpatient Medications on File Prior to Visit  Medication Sig Dispense Refill  . hydrOXYzine (ATARAX/VISTARIL) 25 MG tablet Take 25 mg by mouth daily as needed.    . ondansetron (ZOFRAN) 4 MG tablet Take 1 tablet (4 mg total) by mouth every 6 (six) hours. 12 tablet 0  . PROAIR HFA 108 (90 Base) MCG/ACT inhaler Inhale 1 puff into the lungs.    . [DISCONTINUED] ARIPiprazole (ABILIFY) 5 MG tablet Take 1 tablet (5 mg total) by mouth 2 (two) times daily. 60 tablet 0  . [DISCONTINUED] etonogestrel (NEXPLANON) 68 MG IMPL implant 1 each by Subdermal route once. Implanted 02/05/16    . [DISCONTINUED] FLUoxetine (PROZAC) 40 MG capsule Take 1 capsule (40 mg total) by mouth daily. 30 capsule 0  . [DISCONTINUED] lamoTRIgine (LAMICTAL) 200 MG tablet Take 1 tablet (200 mg total) by mouth every evening. 30 tablet 0  . [DISCONTINUED] prazosin (MINIPRESS) 1  MG capsule Take 1 capsule (1 mg total) by mouth at bedtime. 30 capsule 0   No current facility-administered medications on file prior to visit.    Social History   Socioeconomic History  . Marital status: Single    Spouse name: Not on file  . Number of children: Not on file  . Years of education: Not on file  . Highest education level: Not on file  Occupational History  . Not on file  Tobacco Use  . Smoking status: Never Smoker  . Smokeless tobacco: Never Used    Substance and Sexual Activity  . Alcohol use: No  . Drug use: No    Comment: mother found 3 bottles of pills in her bookbag  . Sexual activity: Never    Birth control/protection: Implant  Other Topics Concern  . Not on file  Social History Narrative  . Not on file   Social Determinants of Health   Financial Resource Strain:   . Difficulty of Paying Living Expenses: Not on file  Food Insecurity:   . Worried About Programme researcher, broadcasting/film/video in the Last Year: Not on file  . Ran Out of Food in the Last Year: Not on file  Transportation Needs:   . Lack of Transportation (Medical): Not on file  . Lack of Transportation (Non-Medical): Not on file  Physical Activity:   . Days of Exercise per Week: Not on file  . Minutes of Exercise per Session: Not on file  Stress:   . Feeling of Stress : Not on file  Social Connections:   . Frequency of Communication with Friends and Family: Not on file  . Frequency of Social Gatherings with Friends and Family: Not on file  . Attends Religious Services: Not on file  . Active Member of Clubs or Organizations: Not on file  . Attends Banker Meetings: Not on file  . Marital Status: Not on file  Intimate Partner Violence:   . Fear of Current or Ex-Partner: Not on file  . Emotionally Abused: Not on file  . Physically Abused: Not on file  . Sexually Abused: Not on file    Family History  Adopted: Yes  Problem Relation Age of Onset  . Severe sprains Neg Hx   . Diabetes Neg Hx   . Clotting disorder Neg Hx     BP 99/68   Pulse (!) 104   Ht 5\' 2"  (1.575 m)   Wt 120 lb (54.4 kg)   BMI 21.95 kg/m   Body mass index is 21.95 kg/m.      Objective:   Physical Exam Vitals and nursing note reviewed. Exam conducted with a chaperone present.  Constitutional:      Appearance: She is well-developed.  HENT:     Head: Normocephalic and atraumatic.  Eyes:     Conjunctiva/sclera: Conjunctivae normal.     Pupils: Pupils are equal, round,  and reactive to light.  Cardiovascular:     Rate and Rhythm: Normal rate and regular rhythm.  Pulmonary:     Effort: Pulmonary effort is normal.  Abdominal:     Palpations: Abdomen is soft.  Musculoskeletal:       Arms:     Cervical back: Normal range of motion and neck supple.  Skin:    General: Skin is warm and dry.  Neurological:     Mental Status: She is alert and oriented to person, place, and time.     Cranial Nerves: No cranial nerve deficit.  Motor: No abnormal muscle tone.     Coordination: Coordination normal.     Deep Tendon Reflexes: Reflexes are normal and symmetric. Reflexes normal.  Psychiatric:        Behavior: Behavior normal.        Thought Content: Thought content normal.        Judgment: Judgment normal.           Assessment & Plan:   Encounter Diagnoses  Name Primary?  . Pinched nerve Yes  . Lumbar pain   . Pain in thoracic spine    I will have her see the neurosurgeon for the back.  She has had chest X-rays as requested by radiologist at her family doctor's office according to the patient.  Call if any problem.  Precautions discussed.   Electronically Signed Darreld Mclean, MD 10/21/20219:41 AM

## 2020-07-12 ENCOUNTER — Encounter: Payer: Self-pay | Admitting: Internal Medicine

## 2020-08-13 ENCOUNTER — Ambulatory Visit (INDEPENDENT_AMBULATORY_CARE_PROVIDER_SITE_OTHER): Payer: Medicaid Other | Admitting: Internal Medicine

## 2020-08-13 ENCOUNTER — Encounter: Payer: Self-pay | Admitting: Internal Medicine

## 2020-08-13 ENCOUNTER — Other Ambulatory Visit: Payer: Self-pay

## 2020-08-13 VITALS — BP 123/81 | HR 105 | Temp 97.3°F | Ht 62.0 in | Wt 117.6 lb

## 2020-08-13 DIAGNOSIS — K625 Hemorrhage of anus and rectum: Secondary | ICD-10-CM

## 2020-08-13 DIAGNOSIS — R1011 Right upper quadrant pain: Secondary | ICD-10-CM | POA: Diagnosis not present

## 2020-08-13 DIAGNOSIS — R112 Nausea with vomiting, unspecified: Secondary | ICD-10-CM

## 2020-08-13 DIAGNOSIS — R102 Pelvic and perineal pain: Secondary | ICD-10-CM | POA: Diagnosis not present

## 2020-08-13 NOTE — Patient Instructions (Signed)
We will schedule you for colonoscopy to further evaluate your rectal bleeding and pelvic pain.  I will also order a right upper quadrant ultrasound to evaluate for gallbladder dysfunction as a cause of your right upper quadrant pain.  Further recommendations to follow  At [facility] we value your feedback. You may receive a survey about your visit today. Please share your experience as we strive to create trusting relationships with our patients to provide genuine, compassionate, quality care.  We appreciate your understanding and patience as we review any laboratory studies, imaging, and other diagnostic tests that are ordered as we care for you. Our office policy is 5 business days for review of these results, and any emergent or urgent results are addressed in a timely manner for your best interest. If you do not hear from our office in 1 week, please contact us.   We also encourage the use of MyChart, which contains your medical information for your review as well. If you are not enrolled in this feature, an access code is on this after visit summary for your convenience. Thank you for allowing [place] to be involved in your care.  It was great to see you today!  I hope you have a great rest of your winter!!    Hennie Duos. Marletta Lor, D.O. Gastroenterology and Hepatology [facility]

## 2020-08-13 NOTE — Progress Notes (Signed)
Primary Care Physician:  Zhou-Talbert, Ralene Bathe, MD Primary Gastroenterologist:  Dr. Marletta Lor  Chief Complaint  Patient presents with  . Rectal Bleeding    x several weeks, about 1-2 times per week, straining at times with BM, recently BM's 4 times per week    HPI:   Sandy Carter is a 19 y.o. female who presents to the clinic today for evaluation by referral from her PCP Dr. Janeece Riggers.  Patient has multiple GI complaints for me today.  She states for the past 2 to 3 months she has had left lower quadrant pain.  States it is constant in nature.  Does not radiate.  Also has right upper quadrant pain on occasion.  This appears to be more intermittent.  States her abdomen "feels weird."  Her right upper quadrant pain is associated with nausea as well.   Also has had rectal bleeding for the past 3 to 4 weeks.  This is primarily occurs with bowel movements.  Notes bright red blood and dark blood with occasional clots.  Also has mucus in her stool on occasion.  Patient is adopted though she does note that her biological mother suffered from alcohol abuse and she believes spinal cancer.  Unsure if any history of colorectal malignancy in the family.  Patient does note taking ibuprofen every 1 to 2 days for herniated disc.  Past Medical History:  Diagnosis Date  . ADD (attention deficit disorder)    Borderline  . Anxiety   . Depression   . ODD (oppositional defiant disorder)     No past surgical history on file.  Current Outpatient Medications  Medication Sig Dispense Refill  . cetirizine (ZYRTEC) 10 MG tablet Take 10 mg by mouth. 1-2 times per week    . hydrOXYzine (ATARAX/VISTARIL) 25 MG tablet Take 25 mg by mouth daily as needed (seizures).     Marland Kitchen ibuprofen (ADVIL) 800 MG tablet Take 800 mg by mouth every 8 (eight) hours as needed.    . ondansetron (ZOFRAN) 4 MG tablet Take 1 tablet (4 mg total) by mouth every 6 (six) hours. (Patient not taking: Reported on 08/13/2020) 12 tablet 0  .  PROAIR HFA 108 (90 Base) MCG/ACT inhaler Inhale 1 puff into the lungs. (Patient not taking: Reported on 08/13/2020)     No current facility-administered medications for this visit.    Allergies as of 08/13/2020 - Review Complete 08/13/2020  Allergen Reaction Noted  . Penicillins Rash 02/12/2020    Family History  Adopted: Yes  Problem Relation Age of Onset  . Severe sprains Neg Hx   . Diabetes Neg Hx   . Clotting disorder Neg Hx     Social History   Socioeconomic History  . Marital status: Single    Spouse name: Not on file  . Number of children: Not on file  . Years of education: Not on file  . Highest education level: Not on file  Occupational History  . Not on file  Tobacco Use  . Smoking status: Current Every Day Smoker    Types: Cigarettes  . Smokeless tobacco: Never Used  Substance and Sexual Activity  . Alcohol use: No  . Drug use: Yes    Types: Marijuana    Comment: not often per pt  . Sexual activity: Never    Birth control/protection: Implant  Other Topics Concern  . Not on file  Social History Narrative  . Not on file   Social Determinants of Health   Financial Resource  Strain:   . Difficulty of Paying Living Expenses: Not on file  Food Insecurity:   . Worried About Programme researcher, broadcasting/film/video in the Last Year: Not on file  . Ran Out of Food in the Last Year: Not on file  Transportation Needs:   . Lack of Transportation (Medical): Not on file  . Lack of Transportation (Non-Medical): Not on file  Physical Activity:   . Days of Exercise per Week: Not on file  . Minutes of Exercise per Session: Not on file  Stress:   . Feeling of Stress : Not on file  Social Connections:   . Frequency of Communication with Friends and Family: Not on file  . Frequency of Social Gatherings with Friends and Family: Not on file  . Attends Religious Services: Not on file  . Active Member of Clubs or Organizations: Not on file  . Attends Banker Meetings: Not on  file  . Marital Status: Not on file  Intimate Partner Violence:   . Fear of Current or Ex-Partner: Not on file  . Emotionally Abused: Not on file  . Physically Abused: Not on file  . Sexually Abused: Not on file    Subjective: Review of Systems  Constitutional: Negative for chills and fever.  HENT: Negative for congestion and hearing loss.   Eyes: Negative for blurred vision and double vision.  Respiratory: Negative for cough and shortness of breath.   Cardiovascular: Negative for chest pain and palpitations.  Gastrointestinal: Positive for abdominal pain, blood in stool and nausea. Negative for constipation, diarrhea, heartburn, melena and vomiting.  Genitourinary: Negative for dysuria and urgency.  Musculoskeletal: Negative for joint pain and myalgias.  Skin: Negative for itching and rash.  Neurological: Negative for dizziness and headaches.  Psychiatric/Behavioral: Negative for depression. The patient is not nervous/anxious.        Objective: BP 123/81   Pulse (!) 105   Temp (!) 97.3 F (36.3 C)   Ht 5\' 2"  (1.575 m)   Wt 117 lb 9.6 oz (53.3 kg)   LMP 08/01/2020   BMI 21.51 kg/m  Physical Exam Constitutional:      Appearance: Normal appearance.  HENT:     Head: Normocephalic and atraumatic.  Eyes:     Extraocular Movements: Extraocular movements intact.     Conjunctiva/sclera: Conjunctivae normal.  Cardiovascular:     Rate and Rhythm: Normal rate and regular rhythm.  Pulmonary:     Effort: Pulmonary effort is normal.     Breath sounds: Normal breath sounds.  Abdominal:     General: Bowel sounds are normal.     Palpations: Abdomen is soft.     Comments: Right upper quadrant tenderness to palpation  Musculoskeletal:        General: No swelling. Normal range of motion.     Cervical back: Normal range of motion and neck supple.  Skin:    General: Skin is warm and dry.     Coloration: Skin is not jaundiced.  Neurological:     General: No focal deficit present.      Mental Status: She is alert and oriented to person, place, and time.  Psychiatric:        Mood and Affect: Mood normal.        Behavior: Behavior normal.      Assessment: *Rectal bleeding  *Pelvic pain *Right upper quadrant pain *Nausea  Plan: Etiology of patient's rectal bleeding and pelvic pain unclear.  We will schedule her for colonoscopy to  evaluate for underlying inflammatory bowel disease such as Crohn's disease or ulcerative colitis, as well as hemorrhoids, AVMs, polyps, malignancy, or other.    If no other identifiable cause identified besides hemorrhoids, we can consider hemorrhoid banding in the outpatient setting.  For her right upper quadrant pain and intermittent nausea, we will begin work-up with right upper quadrant ultrasound to rule out biliary colic.  For further recommendations to follow.  Thank you Dr. Janeece Riggers for the kind referral.   08/13/2020 2:23 PM   Disclaimer: This note was dictated with voice recognition software. Similar sounding words can inadvertently be transcribed and may not be corrected upon review.

## 2020-08-16 ENCOUNTER — Ambulatory Visit: Payer: Medicaid Other | Admitting: Internal Medicine

## 2020-08-21 ENCOUNTER — Ambulatory Visit (HOSPITAL_COMMUNITY): Payer: Medicaid Other

## 2020-08-22 ENCOUNTER — Other Ambulatory Visit (HOSPITAL_COMMUNITY): Payer: Self-pay | Admitting: Family Medicine

## 2020-08-22 DIAGNOSIS — N6452 Nipple discharge: Secondary | ICD-10-CM

## 2020-08-27 ENCOUNTER — Other Ambulatory Visit: Payer: Self-pay

## 2020-08-27 ENCOUNTER — Ambulatory Visit (HOSPITAL_COMMUNITY)
Admission: RE | Admit: 2020-08-27 | Discharge: 2020-08-27 | Disposition: A | Discharge status: Home / Self Care | Payer: Medicaid Other | Source: Ambulatory Visit | Attending: Internal Medicine | Admitting: Internal Medicine

## 2020-08-27 DIAGNOSIS — R112 Nausea with vomiting, unspecified: Secondary | ICD-10-CM | POA: Diagnosis not present

## 2020-09-14 ENCOUNTER — Other Ambulatory Visit (HOSPITAL_COMMUNITY)
Admission: RE | Admit: 2020-09-14 | Discharge: 2020-09-14 | Disposition: A | Discharge status: Home / Self Care | Payer: Medicaid Other | Source: Ambulatory Visit | Attending: Internal Medicine | Admitting: Internal Medicine

## 2020-09-14 ENCOUNTER — Other Ambulatory Visit: Payer: Self-pay

## 2020-09-14 DIAGNOSIS — Z01812 Encounter for preprocedural laboratory examination: Secondary | ICD-10-CM | POA: Diagnosis not present

## 2020-09-14 DIAGNOSIS — Z20822 Contact with and (suspected) exposure to covid-19: Secondary | ICD-10-CM | POA: Diagnosis not present

## 2020-09-14 LAB — PREGNANCY, URINE: Preg Test, Ur: NEGATIVE

## 2020-09-15 LAB — SARS CORONAVIRUS 2 (TAT 6-24 HRS): SARS Coronavirus 2: NEGATIVE

## 2020-09-17 ENCOUNTER — Ambulatory Visit (HOSPITAL_COMMUNITY): Payer: Medicaid Other | Admitting: Anesthesiology

## 2020-09-17 ENCOUNTER — Encounter (HOSPITAL_COMMUNITY): Payer: Self-pay

## 2020-09-17 ENCOUNTER — Encounter (HOSPITAL_COMMUNITY)
Admission: RE | Disposition: A | Discharge status: Home / Self Care | Payer: Self-pay | Source: Home / Self Care | Attending: Internal Medicine

## 2020-09-17 ENCOUNTER — Ambulatory Visit (HOSPITAL_COMMUNITY)
Admission: RE | Admit: 2020-09-17 | Discharge: 2020-09-17 | Disposition: A | Discharge status: Home / Self Care | Payer: Medicaid Other | Attending: Internal Medicine | Admitting: Internal Medicine

## 2020-09-17 ENCOUNTER — Other Ambulatory Visit: Payer: Self-pay

## 2020-09-17 DIAGNOSIS — K648 Other hemorrhoids: Secondary | ICD-10-CM | POA: Diagnosis not present

## 2020-09-17 DIAGNOSIS — Z800 Family history of malignant neoplasm of digestive organs: Secondary | ICD-10-CM | POA: Insufficient documentation

## 2020-09-17 DIAGNOSIS — F17200 Nicotine dependence, unspecified, uncomplicated: Secondary | ICD-10-CM | POA: Insufficient documentation

## 2020-09-17 DIAGNOSIS — Z79899 Other long term (current) drug therapy: Secondary | ICD-10-CM | POA: Insufficient documentation

## 2020-09-17 DIAGNOSIS — K625 Hemorrhage of anus and rectum: Secondary | ICD-10-CM | POA: Insufficient documentation

## 2020-09-17 DIAGNOSIS — R1032 Left lower quadrant pain: Secondary | ICD-10-CM | POA: Diagnosis present

## 2020-09-17 HISTORY — DX: Other intervertebral disc displacement, lumbar region: M51.26

## 2020-09-17 HISTORY — PX: COLONOSCOPY WITH PROPOFOL: SHX5780

## 2020-09-17 SURGERY — COLONOSCOPY WITH PROPOFOL
Anesthesia: General

## 2020-09-17 MED ORDER — LACTATED RINGERS IV SOLN: Freq: Once | INTRAVENOUS | Status: AC

## 2020-09-17 MED ORDER — LACTATED RINGERS IV SOLN: INTRAVENOUS | Status: DC | PRN

## 2020-09-17 MED ORDER — PROPOFOL 10 MG/ML IV BOLUS
INTRAVENOUS | Status: DC | PRN
  Administered 2020-09-17: 80 mg via INTRAVENOUS
  Administered 2020-09-17: 20 mg via INTRAVENOUS

## 2020-09-17 MED ORDER — PROPOFOL 500 MG/50ML IV EMUL
INTRAVENOUS | Status: DC | PRN
  Administered 2020-09-17: 150 ug/kg/min via INTRAVENOUS

## 2020-09-17 NOTE — Transfer of Care (Signed)
Immediate Anesthesia Transfer of Care Note  Patient: Sandy Carter  Procedure(s) Performed: COLONOSCOPY WITH PROPOFOL (N/A )  Patient Location: PACU  Anesthesia Type:General  Level of Consciousness: awake, alert  and oriented  Airway & Oxygen Therapy: Patient Spontanous Breathing  Post-op Assessment: Report given to RN and Post -op Vital signs reviewed and stable  Post vital signs: Reviewed and stable  Last Vitals:  Vitals Value Taken Time  BP 94/57   Temp    Pulse 83   Resp 13   SpO2 99     Last Pain:  Vitals:   09/17/20 1310  TempSrc: Oral  PainSc:       Patients Stated Pain Goal: 10 (09/17/20 1259)  Complications: No complications documented.

## 2020-09-17 NOTE — H&P (Signed)
Primary Care Physician:  Zhou-Talbert, Ralene Bathe, MD Primary Gastroenterologist:  Dr. Marletta Lor  Pre-Procedure History & Physical: HPI:  Sandy Carter is a 20 y.o. female is here for a colonoscopy to be performed for rectal bleeding, LLQ abdominal pain. .  Patient denies any family history of colorectal cancer.  No melena, does note hematochezia primarily with BMs.  Also having LLQ abdominal pain. No unintentional weight loss.  Past Medical History:  Diagnosis Date  . ADD (attention deficit disorder)    Borderline  . Anxiety   . Depression   . Lumbar herniated disc   . ODD (oppositional defiant disorder)     Past Surgical History:  Procedure Laterality Date  . WISDOM TOOTH EXTRACTION      Prior to Admission medications   Medication Sig Start Date End Date Taking? Authorizing Provider  cetirizine (ZYRTEC) 10 MG tablet Take 10 mg by mouth every 30 (thirty) days.    [provider]  hydrOXYzine (ATARAX/VISTARIL) 25 MG tablet Take 25 mg by mouth every 6 (six) hours as needed (seizures).    [provider]  ondansetron (ZOFRAN) 4 MG tablet Take 1 tablet (4 mg total) by mouth every 6 (six) hours. 04/14/20   Wurst, [place], PA-C  PROAIR HFA 108 [phone] Base) MCG/ACT inhaler Inhale 1 puff into the lungs every 6 (six) hours as needed for wheezing or shortness of breath. 06/19/20   [provider]  ARIPiprazole (ABILIFY) 5 MG tablet Take 1 tablet (5 mg total) by mouth 2 (two) times daily. 02/20/16 04/14/20  Denzil Magnuson, NP  etonogestrel (NEXPLANON) 68 MG IMPL implant 1 each by Subdermal route once. Implanted 02/05/16  04/14/20  [provider]  FLUoxetine (PROZAC) 40 MG capsule Take 1 capsule (40 mg total) by mouth daily. 02/20/16 04/14/20  Denzil Magnuson, NP  lamoTRIgine (LAMICTAL) 200 MG tablet Take 1 tablet (200 mg total) by mouth every evening. 02/20/16 04/14/20  Denzil Magnuson, NP  prazosin (MINIPRESS) 1 MG capsule Take 1 capsule (1 mg total) by mouth at bedtime.  02/20/16 04/14/20  Denzil Magnuson, NP    Allergies as of 08/13/2020 - Review Complete 08/13/2020  Allergen Reaction Noted  . Penicillins Rash 02/12/2020    Family History  Adopted: Yes  Problem Relation Age of Onset  . Alcoholism Mother   . Bone cancer Mother   . Diabetes Maternal Grandmother     Social History   Socioeconomic History  . Marital status: Single    Spouse name: Not on file  . Number of children: Not on file  . Years of education: Not on file  . Highest education level: Not on file  Occupational History  . Not on file  Tobacco Use  . Smoking status: Current Every Day Smoker    Types: Cigarettes  . Smokeless tobacco: Never Used  Vaping Use  . Vaping Use: Never used  Substance and Sexual Activity  . Alcohol use: No  . Drug use: Yes    Types: Marijuana    Comment: not often per pt  . Sexual activity: Never    Birth control/protection: Implant  Other Topics Concern  . Not on file  Social History Narrative  . Not on file   Social Determinants of Health   Financial Resource Strain: Not on file  Food Insecurity: Not on file  Transportation Needs: Not on file  Physical Activity: Not on file  Stress: Not on file  Social Connections: Not on file  Intimate Partner Violence: Not on file  Review of Systems: See HPI, otherwise negative ROS  Impression/Plan: Sandy Carter is here for a colonoscopy to be performed for rectal bleeding, LLQ abdominal pain.   The risks of the procedure including infection, bleed, or perforation as well as benefits, limitations, alternatives and imponderables have been reviewed with the patient. Questions have been answered. All parties agreeable.

## 2020-09-17 NOTE — Anesthesia Preprocedure Evaluation (Signed)
Anesthesia Evaluation  Patient identified by MRN, date of birth, ID band Patient awake    Reviewed: Allergy & Precautions, NPO status , Patient's Chart, lab work & pertinent test results  History of Anesthesia Complications Negative for: history of anesthetic complications  Airway Mallampati: II  TM Distance: >3 FB Neck ROM: Full    Dental  (+) Dental Advisory Given,    Pulmonary shortness of breath and with exertion,  COPD inhaler, Current Smoker and Patient abstained from smoking.,  TB when she was a child Small pleural effusions in MRI -06/2020, As per patient effusions resolved           Cardiovascular METS: 3 - Mets + DOE  Normal cardiovascular exam Rhythm:Regular Rate:Normal  12-Feb-2020 00:48:22 [facility] ROUTINE RECORD Sinus tachycardia Probable left atrial enlargement RSR' in V1 or V2, probably normal variant Borderline T abnormalities, anterior leads Confirmed by Ross Marcus ([id number]) on 02/12/2020 1:11:23 AM   Neuro/Psych PSYCHIATRIC DISORDERS Anxiety Depression negative neurological ROS     GI/Hepatic negative GI ROS, Neg liver ROS,   Endo/Other  negative endocrine ROS  Renal/GU negative Renal ROS     Musculoskeletal Chest pain Sternal pain   Abdominal   Peds  Hematology negative hematology ROS (+)   Anesthesia Other Findings   Reproductive/Obstetrics                             Anesthesia Physical Anesthesia Plan  ASA: II  Anesthesia Plan: General   Post-op Pain Management:    Induction: Intravenous  PONV Risk Score and Plan: TIVA  Airway Management Planned: Nasal Cannula and Natural Airway  Additional Equipment:   Intra-op Plan:   Post-operative Plan:   Informed Consent: I have reviewed the patients History and Physical, chart, labs and discussed the procedure including the risks, benefits and alternatives for the proposed anesthesia  with the patient or authorized representative who has indicated his/her understanding and acceptance.     Dental advisory given  Plan Discussed with: CRNA and Surgeon  Anesthesia Plan Comments:         Anesthesia Quick Evaluation

## 2020-09-17 NOTE — Discharge Instructions (Addendum)
  Colonoscopy Discharge Instructions  Read the instructions outlined below and refer to this sheet in the next few weeks. These discharge instructions provide you with general information on caring for yourself after you leave the hospital. Your doctor may also give you specific instructions. While your treatment has been planned according to the most current medical practices available, unavoidable complications occasionally occur.   ACTIVITY  You may resume your regular activity, but move at a slower pace for the next 24 hours.   Take frequent rest periods for the next 24 hours.   Walking will help get rid of the air and reduce the bloated feeling in your belly (abdomen).   No driving for 24 hours (because of the medicine (anesthesia) used during the test).    Do not sign any important legal documents or operate any machinery for 24 hours (because of the anesthesia used during the test).  NUTRITION  Drink plenty of fluids.   You may resume your normal diet as instructed by your doctor.   Begin with a light meal and progress to your normal diet. Heavy or fried foods are harder to digest and may make you feel sick to your stomach (nauseated).   Avoid alcoholic beverages for 24 hours or as instructed.  MEDICATIONS  You may resume your normal medications unless your doctor tells you otherwise.  WHAT YOU CAN EXPECT TODAY  Some feelings of bloating in the abdomen.   Passage of more gas than usual.   Spotting of blood in your stool or on the toilet paper.  IF YOU HAD POLYPS REMOVED DURING THE COLONOSCOPY:  No aspirin products for 7 days or as instructed.   No alcohol for 7 days or as instructed.   Eat a soft diet for the next 24 hours.  FINDING OUT THE RESULTS OF YOUR TEST Not all test results are available during your visit. If your test results are not back during the visit, make an appointment with your caregiver to find out the results. Do not assume everything is normal if  you have not heard from your caregiver or the medical facility. It is important for you to follow up on all of your test results.  SEEK IMMEDIATE MEDICAL ATTENTION IF:  You have more than a spotting of blood in your stool.   Your belly is swollen (abdominal distention).   You are nauseated or vomiting.   You have a temperature over 101.   You have abdominal pain or discomfort that is severe or gets worse throughout the day.   Your colonoscopy was relatively unremarkable.  I did not find any polyps or evidence of colon cancer. I did not find any inflammation suspicious for underlying ulcerative colitis or Crohn's disease.  You do have internal hemorrhoids which is likely causing your bleeding.  We can discuss hemorrhoid banding in the outpatient clinic when you follow-up in 2 to 3 months.  I would recommend we repeat colonoscopy at age 20 for colon cancer screening purposes.  I hope you have a great rest of your week!  Hennie Duos. Marletta Lor, D.O. Gastroenterology and Hepatology [facility]

## 2020-09-17 NOTE — Anesthesia Postprocedure Evaluation (Signed)
Anesthesia Post Note  Patient: Sandy Carter  Procedure(s) Performed: COLONOSCOPY WITH PROPOFOL (N/A )  Patient location during evaluation: Phase II Anesthesia Type: General Level of consciousness: awake and oriented Pain management: satisfactory to patient Vital Signs Assessment: post-procedure vital signs reviewed and stable Respiratory status: spontaneous breathing Cardiovascular status: stable Postop Assessment: no apparent nausea or vomiting Anesthetic complications: no   No complications documented.   Last Vitals:  Vitals:   09/17/20 1310 09/17/20 1417  BP: 115/89 (!) 94/57  Pulse: (!) 111   Resp: 14 18  Temp: 36.9 C 36.5 C  SpO2: 99% 99%    Last Pain:  Vitals:   09/17/20 1417  TempSrc: Oral  PainSc: 0-No pain                 Lorin Glass

## 2020-09-17 NOTE — Op Note (Signed)
[facility] Patient Name: Sandy Carter Procedure Date: 09/17/2020 1:49 PM MRN: [id number] Date of Birth: [birth year] Attending MD: Elon Alas. Abbey Chatters DO CSN: [id number] Age: 20 Admit Type: Outpatient Procedure:                Colonoscopy Indications:              Abdominal pain in the left lower quadrant, Rectal                            bleeding Providers:                Elon Alas. Abbey Chatters, DO, Janeece Riggers, RN, Raphael Gibney, Technician Referring MD:              Medicines:                See the Anesthesia note for documentation of the                            administered medications Complications:            No immediate complications. Estimated Blood Loss:     Estimated blood loss: none. Procedure:                Pre-Anesthesia Assessment:                           - The anesthesia plan was to use monitored                            anesthesia care (MAC).                           After obtaining informed consent, the colonoscope                            was passed under direct vision. Throughout the                            procedure, the patient's blood pressure, pulse, and                            oxygen saturations were monitored continuously. The                            PCF-HQ190L ([id number]) scope was introduced through                            the anus and advanced to the the terminal ileum,                            with identification of the appendiceal orifice and                            IC valve. The colonoscopy was performed without  difficulty. The patient tolerated the procedure                            well. The quality of the bowel preparation was                            evaluated using the BBPS [facility] Bowel Preparation                            Scale) with scores of: Right Colon = 2 (minor                            amount of residual staining, small fragments of                             stool and/or opaque liquid, but mucosa seen well),                            Transverse Colon = 3 (entire mucosa seen well with                            no residual staining, small fragments of stool or                            opaque liquid) and Left Colon = 3 (entire mucosa                            seen well with no residual staining, small                            fragments of stool or opaque liquid). The total                            BBPS score equals 8. The quality of the bowel                            preparation was good. Scope In: 1:57:26 PM Scope Out: 2:14:53 PM Scope Withdrawal Time: 0 hours 7 minutes 59 seconds  Total Procedure Duration: 0 hours 17 minutes 27 seconds  Findings:      The perianal and digital rectal examinations were normal.      Non-bleeding internal hemorrhoids were found during endoscopy.      The terminal ileum appeared normal.      The exam was otherwise without abnormality. Impression:               - Non-bleeding internal hemorrhoids.                           - The examined portion of the ileum was normal.                           - The examination was otherwise normal.                           -  No specimens collected. Moderate Sedation:      Per Anesthesia Care Recommendation:           - Patient has a contact number available for                            emergencies. The signs and symptoms of potential                            delayed complications were discussed with the                            patient. Return to normal activities tomorrow.                            Written discharge instructions were provided to the                            patient.                           - Resume previous diet.                           - Continue present medications.                           - Repeat colonoscopy at age 29 or sooner if higher                            risk for screening purposes.                            - Return to GI clinic in 3 months with Sandy Carter                            to discuss hemorrhoid banding. Procedure Code(s):        --- Professional ---                           [phone], Colonoscopy, flexible; diagnostic, including                            collection of specimen(s) by brushing or washing,                            when performed (separate procedure) Diagnosis Code(s):        --- Professional ---                           K64.8, Other hemorrhoids                           R10.32, Left lower quadrant pain                           K62.5, Hemorrhage of anus  and rectum CPT copyright 2019 American Medical Association. All rights reserved. The codes documented in this report are preliminary and upon coder review may  be revised to meet current compliance requirements. Elon Alas. Abbey Chatters, DO [place] Abbey Chatters, DO 09/17/2020 2:23:38 PM This report has been signed electronically. Number of Addenda: 0

## 2020-09-19 ENCOUNTER — Encounter (HOSPITAL_COMMUNITY): Payer: Self-pay | Admitting: Internal Medicine

## 2020-10-23 ENCOUNTER — Ambulatory Visit (HOSPITAL_COMMUNITY)
Admission: RE | Admit: 2020-10-23 | Discharge: 2020-10-23 | Disposition: A | Discharge status: Home / Self Care | Payer: Medicaid Other | Source: Ambulatory Visit | Attending: Family Medicine | Admitting: Family Medicine

## 2020-10-23 ENCOUNTER — Encounter: Payer: Self-pay | Admitting: Internal Medicine

## 2020-10-23 ENCOUNTER — Other Ambulatory Visit: Payer: Self-pay

## 2020-10-23 DIAGNOSIS — N6452 Nipple discharge: Secondary | ICD-10-CM | POA: Diagnosis present

## 2020-12-12 ENCOUNTER — Other Ambulatory Visit: Payer: Self-pay | Admitting: Neurosurgery

## 2020-12-17 ENCOUNTER — Ambulatory Visit: Payer: Medicaid Other | Admitting: Gastroenterology

## 2020-12-19 ENCOUNTER — Encounter: Payer: Self-pay | Admitting: Internal Medicine

## 2020-12-19 ENCOUNTER — Ambulatory Visit: Payer: Medicaid Other | Admitting: Gastroenterology

## 2020-12-27 ENCOUNTER — Encounter (HOSPITAL_COMMUNITY): Payer: Self-pay | Admitting: Neurosurgery

## 2020-12-27 ENCOUNTER — Other Ambulatory Visit (HOSPITAL_COMMUNITY)
Admission: RE | Admit: 2020-12-27 | Discharge: 2020-12-27 | Disposition: A | Discharge status: Home / Self Care | Payer: Medicaid Other | Source: Ambulatory Visit | Attending: Neurosurgery | Admitting: Neurosurgery

## 2020-12-27 DIAGNOSIS — Z20822 Contact with and (suspected) exposure to covid-19: Secondary | ICD-10-CM | POA: Diagnosis not present

## 2020-12-27 DIAGNOSIS — Z01812 Encounter for preprocedural laboratory examination: Secondary | ICD-10-CM | POA: Insufficient documentation

## 2020-12-27 LAB — SARS CORONAVIRUS 2 (TAT 6-24 HRS): SARS Coronavirus 2: NEGATIVE

## 2020-12-27 NOTE — Progress Notes (Signed)
Spoke with pt for pre-op call. Pt states has hx of hyperthyroidism but is not treated for it. States she gets palpitations at times with the hyperthyroidism. Pt also states she has "seizures". States she has not seen a neurologist but her PCP is giving her Hydroxyzine for "anxiety and seizures". She states she is aware of her surroundings when they happen, shakes a lot, knows when they are beginning.   Covid test done today, result pending.  Pt states she's been in quarantine since the test was done and understands that she stays in quarantine until she comes to the hospital tomorrow.

## 2020-12-28 ENCOUNTER — Ambulatory Visit (HOSPITAL_COMMUNITY): Payer: Medicaid Other | Admitting: Anesthesiology

## 2020-12-28 ENCOUNTER — Encounter (HOSPITAL_COMMUNITY)
Admission: RE | Disposition: A | Discharge status: Home / Self Care | Payer: Self-pay | Source: Home / Self Care | Attending: Neurosurgery

## 2020-12-28 ENCOUNTER — Ambulatory Visit (HOSPITAL_COMMUNITY): Payer: Medicaid Other

## 2020-12-28 ENCOUNTER — Encounter (HOSPITAL_COMMUNITY): Payer: Self-pay | Admitting: Neurosurgery

## 2020-12-28 ENCOUNTER — Ambulatory Visit (HOSPITAL_COMMUNITY)
Admission: RE | Admit: 2020-12-28 | Discharge: 2020-12-29 | Disposition: A | Discharge status: Home / Self Care | Payer: Medicaid Other | Attending: Neurosurgery | Admitting: Neurosurgery

## 2020-12-28 ENCOUNTER — Other Ambulatory Visit: Payer: Self-pay

## 2020-12-28 DIAGNOSIS — M5117 Intervertebral disc disorders with radiculopathy, lumbosacral region: Secondary | ICD-10-CM | POA: Diagnosis not present

## 2020-12-28 DIAGNOSIS — M5126 Other intervertebral disc displacement, lumbar region: Secondary | ICD-10-CM | POA: Diagnosis present

## 2020-12-28 DIAGNOSIS — Z79899 Other long term (current) drug therapy: Secondary | ICD-10-CM | POA: Diagnosis not present

## 2020-12-28 DIAGNOSIS — Z880 Allergy status to penicillin: Secondary | ICD-10-CM | POA: Insufficient documentation

## 2020-12-28 DIAGNOSIS — Z419 Encounter for procedure for purposes other than remedying health state, unspecified: Secondary | ICD-10-CM

## 2020-12-28 HISTORY — DX: Headache, unspecified: R51.9

## 2020-12-28 HISTORY — DX: Respiratory tuberculosis unspecified: A15.9

## 2020-12-28 HISTORY — PX: LUMBAR LAMINECTOMY/DECOMPRESSION MICRODISCECTOMY: SHX5026

## 2020-12-28 HISTORY — DX: Thyrotoxicosis, unspecified without thyrotoxic crisis or storm: E05.90

## 2020-12-28 HISTORY — DX: Cardiac arrhythmia, unspecified: I49.9

## 2020-12-28 HISTORY — DX: Unspecified convulsions: R56.9

## 2020-12-28 LAB — CBC
HCT: 33.8 % — ABNORMAL LOW (ref 36.0–46.0)
Hemoglobin: 11.6 g/dL — ABNORMAL LOW (ref 12.0–15.0)
MCH: 30.7 pg (ref 26.0–34.0)
MCHC: 34.3 g/dL (ref 30.0–36.0)
MCV: 89.4 fL (ref 80.0–100.0)
Platelets: 220 10*3/uL (ref 150–400)
RBC: 3.78 MIL/uL — ABNORMAL LOW (ref 3.87–5.11)
RDW: 11.9 % (ref 11.5–15.5)
WBC: 4.6 10*3/uL (ref 4.0–10.5)
nRBC: 0 % (ref 0.0–0.2)

## 2020-12-28 LAB — POCT PREGNANCY, URINE: Preg Test, Ur: NEGATIVE

## 2020-12-28 SURGERY — LUMBAR LAMINECTOMY/DECOMPRESSION MICRODISCECTOMY 1 LEVEL
Anesthesia: General | Site: Spine Lumbar | Laterality: Right

## 2020-12-28 MED ORDER — SUGAMMADEX SODIUM 200 MG/2ML IV SOLN
INTRAVENOUS | Status: DC | PRN
  Administered 2020-12-28: 150 mg via INTRAVENOUS

## 2020-12-28 MED ORDER — PROMETHAZINE HCL 25 MG/ML IJ SOLN
INTRAMUSCULAR | Status: AC
  Filled 2020-12-28: qty 1

## 2020-12-28 MED ORDER — MORPHINE SULFATE (PF) 2 MG/ML IV SOLN
1.0000 mg | INTRAVENOUS | Status: DC | PRN
  Administered 2020-12-28: 1 mg via INTRAVENOUS
  Filled 2020-12-28: qty 1

## 2020-12-28 MED ORDER — GABAPENTIN 300 MG PO CAPS
300.0000 mg | ORAL_CAPSULE | Freq: Three times a day (TID) | ORAL | Status: DC
  Administered 2020-12-28 – 2020-12-29 (×2): 300 mg via ORAL
  Filled 2020-12-28 (×2): qty 1

## 2020-12-28 MED ORDER — EPHEDRINE SULFATE-NACL 50-0.9 MG/10ML-% IV SOSY
PREFILLED_SYRINGE | INTRAVENOUS | Status: DC | PRN
  Administered 2020-12-28: 5 mg via INTRAVENOUS
  Administered 2020-12-28: 10 mg via INTRAVENOUS

## 2020-12-28 MED ORDER — ONDANSETRON HCL 4 MG/2ML IJ SOLN
INTRAMUSCULAR | Status: AC
  Filled 2020-12-28: qty 2

## 2020-12-28 MED ORDER — LIDOCAINE 2% (20 MG/ML) 5 ML SYRINGE
INTRAMUSCULAR | Status: DC | PRN
  Administered 2020-12-28: 60 mg via INTRAVENOUS

## 2020-12-28 MED ORDER — HYDROXYZINE HCL 25 MG PO TABS: 50.0000 mg | ORAL_TABLET | Freq: Four times a day (QID) | ORAL | Status: DC | PRN

## 2020-12-28 MED ORDER — LIDOCAINE 2% (20 MG/ML) 5 ML SYRINGE
INTRAMUSCULAR | Status: AC
  Filled 2020-12-28: qty 10

## 2020-12-28 MED ORDER — CHLORHEXIDINE GLUCONATE CLOTH 2 % EX PADS: 6.0000 | MEDICATED_PAD | Freq: Once | CUTANEOUS | Status: DC

## 2020-12-28 MED ORDER — CHLORHEXIDINE GLUCONATE 0.12 % MT SOLN
15.0000 mL | Freq: Once | OROMUCOSAL | Status: AC
  Administered 2020-12-28: 15 mL via OROMUCOSAL
  Filled 2020-12-28: qty 15

## 2020-12-28 MED ORDER — FENTANYL CITRATE (PF) 250 MCG/5ML IJ SOLN
INTRAMUSCULAR | Status: DC | PRN
  Administered 2020-12-28: 100 ug via INTRAVENOUS
  Administered 2020-12-28: 50 ug via INTRAVENOUS

## 2020-12-28 MED ORDER — FENTANYL CITRATE (PF) 100 MCG/2ML IJ SOLN
INTRAMUSCULAR | Status: DC | PRN
  Administered 2020-12-28: 100 ug via INTRAVENOUS

## 2020-12-28 MED ORDER — ACETAMINOPHEN 650 MG RE SUPP: 650.0000 mg | RECTAL | Status: DC | PRN

## 2020-12-28 MED ORDER — FENTANYL CITRATE (PF) 100 MCG/2ML IJ SOLN
INTRAMUSCULAR | Status: AC
  Filled 2020-12-28: qty 2

## 2020-12-28 MED ORDER — PROPOFOL 10 MG/ML IV BOLUS
INTRAVENOUS | Status: DC | PRN
  Administered 2020-12-28: 120 mg via INTRAVENOUS

## 2020-12-28 MED ORDER — SODIUM CHLORIDE 0.9% FLUSH: 3.0000 mL | INTRAVENOUS | Status: DC | PRN

## 2020-12-28 MED ORDER — PHENOL 1.4 % MT LIQD: 1.0000 | OROMUCOSAL | Status: DC | PRN

## 2020-12-28 MED ORDER — VANCOMYCIN HCL IN DEXTROSE 1-5 GM/200ML-% IV SOLN
1000.0000 mg | INTRAVENOUS | Status: AC
Start: 2021-01-09 — End: 2020-12-28

## 2020-12-28 MED ORDER — HYDROXYZINE HCL 50 MG/ML IM SOLN
50.0000 mg | Freq: Four times a day (QID) | INTRAMUSCULAR | Status: DC | PRN
  Administered 2020-12-29: 50 mg via INTRAMUSCULAR
  Filled 2020-12-28: qty 1

## 2020-12-28 MED ORDER — FENTANYL CITRATE (PF) 250 MCG/5ML IJ SOLN
INTRAMUSCULAR | Status: AC
  Filled 2020-12-28: qty 5

## 2020-12-28 MED ORDER — PHENYLEPHRINE 40 MCG/ML (10ML) SYRINGE FOR IV PUSH (FOR BLOOD PRESSURE SUPPORT)
PREFILLED_SYRINGE | INTRAVENOUS | Status: DC | PRN
  Administered 2020-12-28: 80 ug via INTRAVENOUS
  Administered 2020-12-28: 40 ug via INTRAVENOUS
  Administered 2020-12-28 (×2): 80 ug via INTRAVENOUS

## 2020-12-28 MED ORDER — ROCURONIUM BROMIDE 10 MG/ML (PF) SYRINGE
PREFILLED_SYRINGE | INTRAVENOUS | Status: DC | PRN
  Administered 2020-12-28: 20 mg via INTRAVENOUS
  Administered 2020-12-28: 40 mg via INTRAVENOUS

## 2020-12-28 MED ORDER — OXYCODONE HCL 5 MG PO TABS: 10.0000 mg | ORAL_TABLET | ORAL | Status: DC | PRN

## 2020-12-28 MED ORDER — PHENYLEPHRINE 40 MCG/ML (10ML) SYRINGE FOR IV PUSH (FOR BLOOD PRESSURE SUPPORT)
PREFILLED_SYRINGE | INTRAVENOUS | Status: AC
  Filled 2020-12-28: qty 10

## 2020-12-28 MED ORDER — KETOROLAC TROMETHAMINE 15 MG/ML IJ SOLN
15.0000 mg | Freq: Four times a day (QID) | INTRAMUSCULAR | Status: DC
  Administered 2020-12-28 – 2020-12-29 (×3): 15 mg via INTRAVENOUS
  Filled 2020-12-28 (×3): qty 1

## 2020-12-28 MED ORDER — METHYLPREDNISOLONE ACETATE 80 MG/ML IJ SUSP
INTRAMUSCULAR | Status: DC | PRN
  Administered 2020-12-28: 80 mg

## 2020-12-28 MED ORDER — METHYLPREDNISOLONE ACETATE 80 MG/ML IJ SUSP
INTRAMUSCULAR | Status: AC
  Filled 2020-12-28: qty 1

## 2020-12-28 MED ORDER — ACETAMINOPHEN 325 MG PO TABS: 650.0000 mg | ORAL_TABLET | ORAL | Status: DC | PRN

## 2020-12-28 MED ORDER — AMISULPRIDE (ANTIEMETIC) 5 MG/2ML IV SOLN
INTRAVENOUS | Status: AC
  Filled 2020-12-28: qty 4

## 2020-12-28 MED ORDER — MIDAZOLAM HCL 2 MG/2ML IJ SOLN
INTRAMUSCULAR | Status: DC | PRN
  Administered 2020-12-28: 2 mg via INTRAVENOUS

## 2020-12-28 MED ORDER — MIDAZOLAM HCL 2 MG/2ML IJ SOLN
INTRAMUSCULAR | Status: AC
  Filled 2020-12-28: qty 2

## 2020-12-28 MED ORDER — ORAL CARE MOUTH RINSE: 15.0000 mL | Freq: Once | OROMUCOSAL | Status: AC

## 2020-12-28 MED ORDER — LACTATED RINGERS IV SOLN: INTRAVENOUS | Status: DC

## 2020-12-28 MED ORDER — PROPOFOL 10 MG/ML IV BOLUS
INTRAVENOUS | Status: AC
  Filled 2020-12-28: qty 20

## 2020-12-28 MED ORDER — SODIUM CHLORIDE 0.9% FLUSH
3.0000 mL | Freq: Two times a day (BID) | INTRAVENOUS | Status: DC
  Administered 2020-12-28: 3 mL via INTRAVENOUS

## 2020-12-28 MED ORDER — PROMETHAZINE HCL 25 MG/ML IJ SOLN
6.2500 mg | INTRAMUSCULAR | Status: AC | PRN
  Administered 2020-12-28 (×2): 6.25 mg via INTRAVENOUS

## 2020-12-28 MED ORDER — TRIAMCINOLONE ACETONIDE 0.1 % EX OINT
1.0000 "application " | TOPICAL_OINTMENT | Freq: Two times a day (BID) | CUTANEOUS | Status: DC | PRN
  Filled 2020-12-28: qty 15

## 2020-12-28 MED ORDER — LORATADINE 10 MG PO TABS
10.0000 mg | ORAL_TABLET | Freq: Every day | ORAL | Status: DC
  Administered 2020-12-28 – 2020-12-29 (×2): 10 mg via ORAL
  Filled 2020-12-28 (×2): qty 1

## 2020-12-28 MED ORDER — LIDOCAINE-EPINEPHRINE 0.5 %-1:200000 IJ SOLN
INTRAMUSCULAR | Status: AC
  Filled 2020-12-28: qty 1

## 2020-12-28 MED ORDER — SODIUM CHLORIDE 0.9 % IV SOLN: 250.0000 mL | INTRAVENOUS | Status: DC

## 2020-12-28 MED ORDER — ONDANSETRON HCL 4 MG/2ML IJ SOLN: 4.0000 mg | Freq: Four times a day (QID) | INTRAMUSCULAR | Status: DC | PRN

## 2020-12-28 MED ORDER — DEXAMETHASONE SODIUM PHOSPHATE 10 MG/ML IJ SOLN
INTRAMUSCULAR | Status: AC
  Filled 2020-12-28: qty 1

## 2020-12-28 MED ORDER — GABAPENTIN 300 MG PO CAPS
300.0000 mg | ORAL_CAPSULE | Freq: Once | ORAL | Status: AC
  Administered 2020-12-28: 300 mg via ORAL
  Filled 2020-12-28: qty 1

## 2020-12-28 MED ORDER — ONDANSETRON HCL 4 MG PO TABS: 4.0000 mg | ORAL_TABLET | Freq: Four times a day (QID) | ORAL | Status: DC | PRN

## 2020-12-28 MED ORDER — LIDOCAINE-EPINEPHRINE 0.5 %-1:200000 IJ SOLN
INTRAMUSCULAR | Status: DC | PRN
  Administered 2020-12-28: 4 mL

## 2020-12-28 MED ORDER — ONDANSETRON HCL 4 MG/2ML IJ SOLN
INTRAMUSCULAR | Status: DC | PRN
  Administered 2020-12-28: 4 mg via INTRAVENOUS

## 2020-12-28 MED ORDER — ONDANSETRON HCL 4 MG PO TABS: 4.0000 mg | ORAL_TABLET | Freq: Three times a day (TID) | ORAL | Status: DC | PRN

## 2020-12-28 MED ORDER — POTASSIUM CHLORIDE IN NACL 20-0.9 MEQ/L-% IV SOLN: INTRAVENOUS | Status: DC

## 2020-12-28 MED ORDER — ACETAMINOPHEN 500 MG PO TABS
1000.0000 mg | ORAL_TABLET | Freq: Once | ORAL | Status: AC
  Administered 2020-12-28: 1000 mg via ORAL
  Filled 2020-12-28: qty 2

## 2020-12-28 MED ORDER — ALBUTEROL SULFATE (2.5 MG/3ML) 0.083% IN NEBU: 2.5000 mg | INHALATION_SOLUTION | Freq: Four times a day (QID) | RESPIRATORY_TRACT | Status: DC | PRN

## 2020-12-28 MED ORDER — MENTHOL 3 MG MT LOZG: 1.0000 | LOZENGE | OROMUCOSAL | Status: DC | PRN

## 2020-12-28 MED ORDER — 0.9 % SODIUM CHLORIDE (POUR BTL) OPTIME
TOPICAL | Status: DC | PRN
  Administered 2020-12-28: 1000 mL

## 2020-12-28 MED ORDER — DEXAMETHASONE SODIUM PHOSPHATE 10 MG/ML IJ SOLN
INTRAMUSCULAR | Status: DC | PRN
  Administered 2020-12-28: 10 mg via INTRAVENOUS

## 2020-12-28 MED ORDER — ONDANSETRON HCL 4 MG/2ML IJ SOLN
4.0000 mg | Freq: Once | INTRAMUSCULAR | Status: AC
  Administered 2020-12-28: 4 mg via INTRAVENOUS

## 2020-12-28 MED ORDER — THROMBIN 5000 UNITS EX SOLR
CUTANEOUS | Status: DC | PRN
  Administered 2020-12-28 (×2): 5000 [IU] via TOPICAL

## 2020-12-28 MED ORDER — KETOROLAC TROMETHAMINE 15 MG/ML IJ SOLN: 15.0000 mg | Freq: Once | INTRAMUSCULAR | Status: DC

## 2020-12-28 MED ORDER — FENTANYL CITRATE (PF) 100 MCG/2ML IJ SOLN
25.0000 ug | INTRAMUSCULAR | Status: DC | PRN
  Administered 2020-12-28 (×2): 25 ug via INTRAVENOUS

## 2020-12-28 MED ORDER — DIAZEPAM 5 MG PO TABS
5.0000 mg | ORAL_TABLET | Freq: Four times a day (QID) | ORAL | Status: DC | PRN
  Administered 2020-12-28 – 2020-12-29 (×2): 5 mg via ORAL
  Filled 2020-12-28 (×2): qty 1

## 2020-12-28 MED ORDER — HEMOSTATIC AGENTS (NO CHARGE) OPTIME
TOPICAL | Status: DC | PRN
  Administered 2020-12-28: 1 via TOPICAL

## 2020-12-28 MED ORDER — KETOROLAC TROMETHAMINE 15 MG/ML IJ SOLN
INTRAMUSCULAR | Status: AC
  Administered 2020-12-28: 15 mg
  Filled 2020-12-28: qty 1

## 2020-12-28 MED ORDER — AMISULPRIDE (ANTIEMETIC) 5 MG/2ML IV SOLN
10.0000 mg | Freq: Once | INTRAVENOUS | Status: AC
  Administered 2020-12-28: 10 mg via INTRAVENOUS

## 2020-12-28 MED ORDER — THROMBIN 5000 UNITS EX SOLR
CUTANEOUS | Status: AC
  Filled 2020-12-28: qty 10000

## 2020-12-28 MED ORDER — HYDROCODONE-ACETAMINOPHEN 5-325 MG PO TABS
1.0000 | ORAL_TABLET | ORAL | Status: DC | PRN
  Administered 2020-12-28 – 2020-12-29 (×2): 2 via ORAL
  Administered 2020-12-29: 1 via ORAL
  Filled 2020-12-28: qty 2
  Filled 2020-12-28: qty 1
  Filled 2020-12-28: qty 2

## 2020-12-28 MED ORDER — VANCOMYCIN HCL IN DEXTROSE 1-5 GM/200ML-% IV SOLN
INTRAVENOUS | Status: AC
  Administered 2020-12-28: 1000 mg via INTRAVENOUS
  Filled 2020-12-28: qty 200

## 2020-12-28 MED ORDER — ROCURONIUM BROMIDE 10 MG/ML (PF) SYRINGE
PREFILLED_SYRINGE | INTRAVENOUS | Status: AC
  Filled 2020-12-28: qty 20

## 2020-12-28 SURGICAL SUPPLY — 45 items
BAND RUBBER #18 3X1/16 STRL (MISCELLANEOUS) ×4 IMPLANT
BENZOIN TINCTURE PRP APPL 2/3 (GAUZE/BANDAGES/DRESSINGS) IMPLANT
BLADE CLIPPER SURG (BLADE) IMPLANT
BUR MATCHSTICK NEURO 3.0 LAGG (BURR) ×2 IMPLANT
BUR PRECISION FLUTE 5.0 (BURR) IMPLANT
CANISTER SUCT 3000ML PPV (MISCELLANEOUS) ×2 IMPLANT
CARTRIDGE OIL MAESTRO DRILL (MISCELLANEOUS) ×1 IMPLANT
COVER WAND RF STERILE (DRAPES) ×2 IMPLANT
DECANTER SPIKE VIAL GLASS SM (MISCELLANEOUS) ×2 IMPLANT
DERMABOND ADVANCED (GAUZE/BANDAGES/DRESSINGS) ×1
DERMABOND ADVANCED .7 DNX12 (GAUZE/BANDAGES/DRESSINGS) ×1 IMPLANT
DIFFUSER DRILL AIR PNEUMATIC (MISCELLANEOUS) ×2 IMPLANT
DRAPE LAPAROTOMY 100X72X124 (DRAPES) ×2 IMPLANT
DRAPE MICROSCOPE LEICA (MISCELLANEOUS) ×2 IMPLANT
DRAPE SURG 17X23 STRL (DRAPES) ×2 IMPLANT
DURAPREP 26ML APPLICATOR (WOUND CARE) ×2 IMPLANT
ELECT REM PT RETURN 9FT ADLT (ELECTROSURGICAL) ×2
ELECTRODE REM PT RTRN 9FT ADLT (ELECTROSURGICAL) ×1 IMPLANT
GAUZE 4X4 16PLY RFD (DISPOSABLE) IMPLANT
GAUZE SPONGE 4X4 12PLY STRL (GAUZE/BANDAGES/DRESSINGS) IMPLANT
GLOVE ECLIPSE 6.5 STRL STRAW (GLOVE) ×2 IMPLANT
GLOVE EXAM NITRILE XL STR (GLOVE) IMPLANT
GOWN STRL REUS W/ TWL LRG LVL3 (GOWN DISPOSABLE) ×2 IMPLANT
GOWN STRL REUS W/ TWL XL LVL3 (GOWN DISPOSABLE) IMPLANT
GOWN STRL REUS W/TWL 2XL LVL3 (GOWN DISPOSABLE) IMPLANT
GOWN STRL REUS W/TWL LRG LVL3 (GOWN DISPOSABLE) ×2
GOWN STRL REUS W/TWL XL LVL3 (GOWN DISPOSABLE)
KIT BASIN OR (CUSTOM PROCEDURE TRAY) ×2 IMPLANT
KIT TURNOVER KIT B (KITS) ×2 IMPLANT
NEEDLE HYPO 25X1 1.5 SAFETY (NEEDLE) ×2 IMPLANT
NEEDLE SPNL 18GX3.5 QUINCKE PK (NEEDLE) IMPLANT
NS IRRIG 1000ML POUR BTL (IV SOLUTION) ×2 IMPLANT
OIL CARTRIDGE MAESTRO DRILL (MISCELLANEOUS) ×2
PACK LAMINECTOMY NEURO (CUSTOM PROCEDURE TRAY) ×2 IMPLANT
PAD ARMBOARD 7.5X6 YLW CONV (MISCELLANEOUS) ×6 IMPLANT
SPONGE LAP 4X18 RFD (DISPOSABLE) IMPLANT
SPONGE SURGIFOAM ABS GEL SZ50 (HEMOSTASIS) ×2 IMPLANT
STRIP CLOSURE SKIN 1/2X4 (GAUZE/BANDAGES/DRESSINGS) IMPLANT
SUT VIC AB 0 CT1 18XCR BRD8 (SUTURE) ×1 IMPLANT
SUT VIC AB 0 CT1 8-18 (SUTURE) ×1
SUT VIC AB 2-0 CT1 18 (SUTURE) ×2 IMPLANT
SUT VIC AB 3-0 SH 8-18 (SUTURE) ×2 IMPLANT
TOWEL GREEN STERILE (TOWEL DISPOSABLE) ×2 IMPLANT
TOWEL GREEN STERILE FF (TOWEL DISPOSABLE) ×2 IMPLANT
WATER STERILE IRR 1000ML POUR (IV SOLUTION) ×2 IMPLANT

## 2020-12-28 NOTE — Anesthesia Preprocedure Evaluation (Signed)
Anesthesia Evaluation  Patient identified by MRN, date of birth, ID band Patient awake    Reviewed: Allergy & Precautions, NPO status , Patient's Chart, lab work & pertinent test results  Airway Mallampati: II  TM Distance: >3 FB Neck ROM: Full    Dental  (+) Dental Advisory Given   Pulmonary neg pulmonary ROS,    breath sounds clear to auscultation       Cardiovascular + dysrhythmias  Rhythm:Regular Rate:Normal     Neuro/Psych Seizures -,     GI/Hepatic negative GI ROS, Neg liver ROS,   Endo/Other  Hyperthyroidism   Renal/GU negative Renal ROS     Musculoskeletal   Abdominal   Peds  Hematology negative hematology ROS (+)   Anesthesia Other Findings   Reproductive/Obstetrics                             Anesthesia Physical Anesthesia Plan  ASA: II  Anesthesia Plan: General   Post-op Pain Management:    Induction: Intravenous  PONV Risk Score and Plan: 3 and Ondansetron, Dexamethasone, Midazolam and Treatment may vary due to age or medical condition  Airway Management Planned: Oral ETT  Additional Equipment: None  Intra-op Plan:   Post-operative Plan: Extubation in OR  Informed Consent: I have reviewed the patients History and Physical, chart, labs and discussed the procedure including the risks, benefits and alternatives for the proposed anesthesia with the patient or authorized representative who has indicated his/her understanding and acceptance.     Dental advisory given  Plan Discussed with: CRNA  Anesthesia Plan Comments:         Anesthesia Quick Evaluation

## 2020-12-28 NOTE — H&P (Signed)
Sandy Carter is a very young woman, 20 years of age, who presents with a history of back pain dating to the 3rd grade.  At that time, a table collapsed on her leg, she went home, and while she was at home, there was something that occurred, and she felt her whole body pop, but she certainly felt her back pop.  She has had pain and discomfort since that time.  She had a single epidural injection about 2 years ago.  Dr. Hilda Lias, in his note to [place], says that she had 2; she only remembers one.  She had physical therapy earlier this year that also was not helpful in relieving her pain.  Most of the pain is in the lower back.  If the pain radiated into the lower extremities, it seems as if it had a preference for the left side.  She weighs 119 pounds.  Temperature is 98.3, blood pressure is 122/82, pulse is 99.  Pain is 7/10.  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  She has not been on a schedule for at least 2 weeks, and she tries to work at least 4 hours when she does.  In her words, "years ago I could not stand up straight after a table collapsed on my leg, I was picked up and out into a chair, and everything cracked, and she felt that her spine had shifted."  She walked in a hunched manner for a number of years as a teenager.  She says the pain was acute at that time.  Previous surgery includes wisdom teeth extraction.  She has a history of asthma, takes a ProAir inhaler, Hydroxyzine, and Ibuprofen.     ALLERGIES :  She has nausea and vomiting with penicillin, but no true allergy.  No known drug allergies outside of that intolerance.     REVIEW OF SYSTEMS :  Positive for easy bruising, chest pain, abdominal pain, blood in her urine, seasonal allergies, fatigue, easy bleeding, cold intolerance, arm and lower extremity swelling, shortness of breath, change in stools, some blood on her stools.  Sandy Carter states that she actually does not have asthma, but she has had problems taking a deep breath.  She has  been found to have bilateral pleural effusions though not large.  It is just becoming more and more difficult.  She does not have urinary incontinence.  She does have tremors.     PAST MEDICAL HISTORY :  Includes hypertension, seizures, thyroid disease.     SOCIAL HISTORY :  She was adopted.  She does not live alone.  She is single.  She does not have children.  She does not use alcohol.  She has vaped in the past.     PHYSICAL EXAMINATION :  She is alert, oriented x4.  She answers all questions appropriately.  Memory, language, attention span, and fund of knowledge are normal.  Speech is clear, it is also fluent.  Pupils equal, round, and reactive to light.  Full extraocular movements.  Full visual fields.  Hearing intact to voice.  She has 5/5 strength in both upper and lower extremities.  Romberg is negative.  She is able to toe walk and heel walk and do a squat with ease.  Reflexes 2+ biceps, triceps, brachioradialis, knees, and ankles.  Proprioception is intact.  Gait is normal.     IMAGING :  MRI was reviewed and shows a degenerated and herniated disc at L5-S1.  It is eccentric, for the most  part, to the right, but there certainly is a leftward component.     ASSESSMENT/PLAN :  Sandy Carter has pain in the lower back.  She has a degenerated herniated disc.  She is only 20 years of age.  I would like to try an oral steroid taper to make sure I put her through our steps.  If that is unsuccessful, we can try some epidural injections.  She has already stated that given the amount of pain that she is in, if I would believe that an operation would be helpful, she would be willing to do it.  I just want to make sure that at the age of 56 that all bases have been covered and that we do not jump into any therapeutic regimen prior to its obvious need.   Sandy Carter was last seen in approximately June 2021, actually it was more recent than that.  She comes in today because she says at this  point the pain is too significant for her to continue to deal with.  She has a herniated disc which was seen in September/October, and at that time, she said the pain was not that significant.  She is no longer working as a Engineer, production.  She says that the pain is omnipresent in the lower extremities and the back.  She does have a herniated disc at L5-S1, slightly more eccentric to the right than left.  At this point, she simply wants the disc out.  She does have obvious pathology, and therefore I think it is very reasonable to go ahead and pursue operative decompression.  I gave her a very detailed instruction sheet with regard to the operation.  She understands and wishes to proceed.

## 2020-12-28 NOTE — Anesthesia Procedure Notes (Signed)
Procedure Name: Intubation Date/Time: 12/28/2020 1:47 PM Performed by: Thelma Comp, CRNA Pre-anesthesia Checklist: Patient identified, Emergency Drugs available, Suction available and Patient being monitored Patient Re-evaluated:Patient Re-evaluated prior to induction Oxygen Delivery Method: Circle System Utilized Preoxygenation: Pre-oxygenation with 100% oxygen Induction Type: IV induction Ventilation: Mask ventilation without difficulty Laryngoscope Size: Mac and 3 Grade View: Grade I Tube type: Oral Tube size: 7.0 mm Number of attempts: 1 Airway Equipment and Method: Stylet and Oral airway Placement Confirmation: ETT inserted through vocal cords under direct vision,  positive ETCO2 and breath sounds checked- equal and bilateral Secured at: 22 cm Tube secured with: Tape Dental Injury: Teeth and Oropharynx as per pre-operative assessment

## 2020-12-28 NOTE — Op Note (Signed)
12/28/2020  4:13 PM  PATIENT:  Sandy Carter  20 y.o. female  PRE-OPERATIVE DIAGNOSIS:  Displacement of lumbosacral intervertebral disc L5/S1  POST-OPERATIVE DIAGNOSIS:  Displacement of lumbosacral intervertebral disc L5/S1  PROCEDURE:  Procedure(s): Right Lumbar Five Sacral One Microdiscectomy  SURGEON:   Surgeon(s): Coletta Memos, MD Dawley, Alan Mulder, DO  ASSISTANTS:Dawley, Kendell Bane  ANESTHESIA:   local and general  EBL:  Total I/O In: 900 [I.V.:900] Out: 20 [Blood:20]  BLOOD ADMINISTERED:none  CELL SAVER GIVEN:none  COUNT:per nursing  DRAINS: none   SPECIMEN:  No Specimen  DICTATION: Mrs. Sherlean Foot was taken to the operating room, intubated and placed under a general anesthetic without difficulty. She was positioned prone on a Wilson frame with all pressure points padded. Her back was prepped and draped in a sterile manner. I opened the skin with a 10 blade and carried the dissection down to the thoracolumbar fascia. I used both sharp dissection and the monopolar cautery to expose the lamina of L5, and S1. I confirmed my location with an intraoperative xray.  I used the drill, Kerrison punches to remove the ligamentum flavum to expose the thecal sac. I brought the microscope into the operative field and with Dr.Dawley's assistance we started our decompression of the spinal canal, thecal sac and S1 root(s). I cauterized epidural veins overlying the disc space then divided them sharply. I opened the disc space with a 15 blade and proceeded with the discectomy. I used pituitary rongeurs, curettes, and other instruments to remove disc material. After the discectomy was completed we inspected the S1 nerve root and felt it was well decompressed. I explored rostrally, laterally, medially, and caudally and was satisfied with the decompression. I irrigated the wound, then closed in layers. I approximated the thoracolumbar fascia, subcutaneous, and subcuticular planes with vicryl sutures. I  used dermabond for a sterile dressing.   PLAN OF CARE: Admit for overnight observation  PATIENT DISPOSITION:  PACU - hemodynamically stable.   Delay start of Pharmacological VTE agent (>24hrs) due to surgical blood loss or risk of bleeding:  no

## 2020-12-28 NOTE — Transfer of Care (Signed)
Immediate Anesthesia Transfer of Care Note  Patient: Sandy Carter  Procedure(s) Performed: Right Lumbar Five Sacral One Microdiscectomy (Right Spine Lumbar)  Patient Location: PACU  Anesthesia Type:General  Level of Consciousness: awake, alert  and patient cooperative  Airway & Oxygen Therapy: Patient Spontanous Breathing and Patient connected to face mask oxygen  Post-op Assessment: Report given to RN and Post -op Vital signs reviewed and stable  Post vital signs: Reviewed and stable  Last Vitals:  Vitals Value Taken Time  BP 129/69 12/28/20 1547  Temp    Pulse 135 12/28/20 1551  Resp 12 12/28/20 1551  SpO2 94 % 12/28/20 1551  Vitals shown include unvalidated device data.  Last Pain:  Vitals:   12/28/20 1113  TempSrc:   PainSc: 7       Patients Stated Pain Goal: 2 (12/28/20 1113)  Complications: No complications documented.

## 2020-12-29 DIAGNOSIS — M5117 Intervertebral disc disorders with radiculopathy, lumbosacral region: Secondary | ICD-10-CM | POA: Diagnosis not present

## 2020-12-29 MED ORDER — TIZANIDINE HCL 4 MG PO TABS: 4.0000 mg | ORAL_TABLET | Freq: Three times a day (TID) | ORAL | 1 refills | Status: DC | PRN

## 2020-12-29 MED ORDER — OXYCODONE HCL 10 MG PO TABS: 10.0000 mg | ORAL_TABLET | Freq: Four times a day (QID) | ORAL | 0 refills | Status: DC | PRN

## 2020-12-29 NOTE — Discharge Instructions (Signed)
Wound Care Leave incision open to air. You may shower. Do not scrub directly on incision.  Do not put any creams, lotions, or ointments on incision. Activity Walk each and every day, increasing distance each day. No lifting greater than 5 lbs.  Avoid bending, arching, and twisting. No driving for 2 weeks; may ride as a passenger locally. Diet Resume your normal diet.  Return to Work Will be discussed at you follow up appointment. Call Your Doctor If Any of These Occur Redness, drainage, or swelling at the wound.  Temperature greater than 101 degrees. Severe pain not relieved by pain medication. Incision starts to come apart. Follow Up Appt Call today for appointment in 2-4 weeks ([phone]) or for problems.  If you have any hardware placed in your spine, you will need an x-ray before your appointment. 

## 2020-12-29 NOTE — Anesthesia Postprocedure Evaluation (Signed)
Anesthesia Post Note  Patient: [name]  Procedure(s) Performed: Right Lumbar Five Sacral One Microdiscectomy (Right Spine Lumbar)     Patient location during evaluation: PACU Anesthesia Type: General Level of consciousness: awake and alert Pain management: pain level controlled Vital Signs Assessment: post-procedure vital signs reviewed and stable Respiratory status: spontaneous breathing, nonlabored ventilation, respiratory function stable and patient connected to nasal cannula oxygen Cardiovascular status: blood pressure returned to baseline and stable Postop Assessment: no apparent nausea or vomiting Anesthetic complications: no   No complications documented.  Last Vitals:  Vitals:   12/29/20 0447 12/29/20 0731  BP: (!) 106/56 116/70  Pulse: 69 100  Resp: 18 16  Temp: 36.6 C 36.4 C  SpO2: 100% 100%    Last Pain:  Vitals:   12/29/20 0731  TempSrc: Oral  PainSc:                  Kennieth Rad

## 2020-12-29 NOTE — Evaluation (Signed)
Physical Therapy Evaluation Patient Details Name: Sandy Carter MRN: [id number] DOB: 28-Jul-2001 Today's Date: 12/29/2020   History of Present Illness  Pt is a 20 y/o female who presents s/p L5-S1 laminectomy/decompression on 12/28/2020. 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    Clinical Impression  Pt admitted with above diagnosis. At the time of PT eval, pt was able to demonstrate transfers and ambulation with up to min assist for balance support and safety without an AD. Pt shaking and with episodes of weakness during gait training where she needed assist and to take standing rest breaks. VSS after gait training and listed below in gait section. Pt was educated on precautions, appropriate activity progression, and car transfer. Pt currently with functional limitations due to the deficits listed below (see PT Problem List). Pt will benefit from skilled PT to increase their independence and safety with mobility to allow discharge to the venue listed below.      Follow Up Recommendations No PT follow up;Supervision for mobility/OOB    Equipment Recommendations  None recommended by PT    Recommendations for Other Services       Precautions / Restrictions Precautions Precautions: Fall;Back Precaution Booklet Issued: Yes (comment) Precaution Comments: Reviewed handout and pt was cued for precautions during functional mobility. Required Braces or Orthoses:  ("No brace needed" order) Restrictions Weight Bearing Restrictions: No      Mobility  Bed Mobility Overal bed mobility: Modified Independent Bed Mobility: Rolling;Sidelying to Sit           General bed mobility comments: HOB flat and rails lowered to simulate home environment. No assist to transition to sitting however VC's provided for optimal log roll technique.    Transfers Overall transfer level: Needs assistance Equipment used: None Transfers: Sit to/from  Stand Sit to Stand: Min guard         General transfer comment: Pt with flexed knees upon full stand. Pt appeared to slightly buckle but was able to recover without assistance. Pt reports nausea with stand.  Ambulation/Gait Ambulation/Gait assistance: Min guard;Min assist Gait Distance (Feet): 200 Feet Assistive device: None;1 person hand held assist Gait Pattern/deviations: Step-through pattern;Decreased stride length;Trunk flexed Gait velocity: Decreased Gait velocity interpretation: <1.31 ft/sec, indicative of household ambulator General Gait Details: VC's for improved posture and general safety. Pt shaking while ambulating and with occasional episodes of B knee flexion which appeared like a controlled knee buckle. HHA provided the last 50' of gait training as pt taking standing rest breaks and reports feeling weak. VSS after ambulation with BP 111/76 (86), 104 bpm HR, 100% O2  Stairs Stairs: Yes Stairs assistance: Min guard Stair Management: One rail Right;Step to pattern;Forwards Number of Stairs: 10 General stair comments: VC's for sequencing and general safety. No assist required however close guard provided.  Wheelchair Mobility    Modified Rankin (Stroke Patients Only)       Balance Overall balance assessment: Needs assistance Sitting-balance support: Feet supported;No upper extremity supported Sitting balance-Leahy Scale: Fair     Standing balance support: No upper extremity supported;During functional activity Standing balance-Leahy Scale: Poor Standing balance comment: Episodes of a knee buckle-like movement.                             Pertinent Vitals/Pain Pain Assessment: Faces Faces Pain Scale: Hurts little more Pain Location: Incision site, and pt reports sternal pain and some popping that she states is baseline  Pain Descriptors / Indicators: Operative site guarding;Sharp Pain Intervention(s): Limited activity within patient's  tolerance;Monitored during session;Repositioned    Home Living Family/patient expects to be discharged to:: Private residence Living Arrangements: Spouse/significant other Available Help at Discharge: Friend(s);Available PRN/intermittently (Almost 24 hours between boyfriend and his mother) Type of Home: House Home Access: Stairs to enter   [employer] of Steps: 3-4 Home Layout: Two level Home Equipment: None      Prior Function Level of Independence: Independent               Hand Dominance        Extremity/Trunk Assessment   Upper Extremity Assessment Upper Extremity Assessment: Defer to OT evaluation    Lower Extremity Assessment Lower Extremity Assessment: Generalized weakness    Cervical / Trunk Assessment Cervical / Trunk Assessment: Other exceptions Cervical / Trunk Exceptions: s/p surgery  Communication   Communication: No difficulties  Cognition Arousal/Alertness: Awake/alert Behavior During Therapy: WFL for tasks assessed/performed Overall Cognitive Status: Within Functional Limits for tasks assessed                                        General Comments      Exercises     Assessment/Plan    PT Assessment Patient needs continued PT services  PT Problem List Decreased strength;Decreased activity tolerance;Decreased balance;Decreased mobility;Decreased knowledge of use of DME;Decreased safety awareness;Decreased knowledge of precautions;Pain       PT Treatment Interventions DME instruction;Gait training;Stair training;Functional mobility training;Therapeutic exercise;Therapeutic activities;Neuromuscular re-education;Balance training;Patient/family education    PT Goals (Current goals can be found in the Care Plan section)  Acute Rehab PT Goals Patient Stated Goal: Be able to take care of herself at home PT Goal Formulation: With patient Time For Goal Achievement: 01/05/21 Potential to Achieve Goals: Good     Frequency Min 5X/week   Barriers to discharge        Co-evaluation               AM-PAC PT "6 Clicks" Mobility  Outcome Measure Help needed turning from your back to your side while in a flat bed without using bedrails?: None Help needed moving from lying on your back to sitting on the side of a flat bed without using bedrails?: None Help needed moving to and from a bed to a chair (including a wheelchair)?: A Little Help needed standing up from a chair using your arms (e.g., wheelchair or bedside chair)?: A Little Help needed to walk in hospital room?: A Little Help needed climbing 3-5 steps with a railing? : A Little 6 Click Score: 20    End of Session Equipment Utilized During Treatment: Gait belt Activity Tolerance: Patient tolerated treatment well Patient left: in bed;with call bell/phone within reach Nurse Communication: Mobility status PT Visit Diagnosis: Unsteadiness on feet (R26.81);Pain Pain - part of body:  (back, sternum)    Time: 6073-7106 PT Time Calculation (min) (ACUTE ONLY): 18 min   Charges:   PT Evaluation $PT Eval Low Complexity: 1 Low          Conni Slipper, PT, DPT Acute Rehabilitation Services Pager: [phone] Office: [phone]   Sandy Carter 12/29/2020, 10:34 AM

## 2020-12-29 NOTE — Discharge Summary (Signed)
  Physician Discharge Summary  Patient ID: Sandy Carter MRN: [id number] DOB/AGE: [birth year] 20 y.o.  Admit date: 12/28/2020 Discharge date: 12/29/2020  Admission Diagnoses: Lumbar disc herniation with radiculopathy, L5-S1  Discharge Diagnoses: Same Active Problems:   HNP (herniated nucleus pulposus), lumbar   Discharged Condition: Stable  Hospital Course:  [name] is a 20 y.o. female who was electively admitted after L5-S1 microdiscectomy.She had postop N/V and was admitted. On POD#1 the patient was at her neurologic baseline, with improved N/V. Back pain was controlled with oral medication, she was ambulating without difficulty, voiding normally, and tolerating diet.  Treatments: Surgery - L5-S1 laminotomy, microdiscectomy  Discharge Exam: Blood pressure 116/70, pulse 100, temperature 97.6 F (36.4 C), temperature source Oral, resp. rate 16, height 5\' 2"  (1.575 m), weight 47.6 kg, last menstrual period 12/06/2020, SpO2 100 %. Awake, alert, oriented Speech fluent, appropriate CN grossly intact 5/5 BUE/BLE Wound c/d/i  Disposition:   Discharge Instructions    Incentive spirometry RT   Complete by: As directed      Allergies as of 12/29/2020      Reactions   Penicillins Rash   Reaction: 5 years      Medication List    TAKE these medications   cetirizine 10 MG tablet Commonly known as: ZYRTEC Take 10 mg by mouth every 30 (thirty) days.   diclofenac Sodium 1 % Gel Commonly known as: VOLTAREN Apply 1 application topically 2 (two) times daily as needed (swelling/ joint an muscle pain).   hydrOXYzine 50 MG tablet Commonly known as: ATARAX/VISTARIL Take 50 mg by mouth every 6 (six) hours as needed for anxiety or itching (Seizures).   ibuprofen 200 MG tablet Commonly known as: ADVIL Take 800 mg by mouth every 6 (six) hours as needed for headache.   ondansetron 4 MG tablet Commonly known as: ZOFRAN Take 1 tablet (4 mg total) by mouth every 6 (six)  hours. What changed:   when to take this  reasons to take this   Oxycodone HCl 10 MG Tabs Take 1 tablet (10 mg total) by mouth every 6 (six) hours as needed for severe pain ((score 7 to 10)).   ProAir HFA 108 (90 Base) MCG/ACT inhaler Generic drug: albuterol Inhale 1 puff into the lungs every 6 (six) hours as needed for wheezing or shortness of breath.   tiZANidine 4 MG tablet Commonly known as: Zanaflex Take 1 tablet (4 mg total) by mouth 3 (three) times daily as needed for muscle spasms.   triamcinolone ointment 0.1 % Commonly known as: KENALOG Apply 1 application topically 2 (two) times daily as needed (Skin bumps).       Follow-up Information    12/31/2020, MD Follow up in 2 week(s).   Specialty: Neurosurgery Contact information: [address] [place] [phone]               Signed: [phone] 12/29/2020, 8:13 AM

## 2020-12-29 NOTE — Progress Notes (Signed)
Patient is discharged from room 3C07 at this time. Alert and in stable condition. IV site d/c'd and instructions read to patient and spouse with understanding verbalized and all questions answered. Left unit via wheelchair with all belongings at side. 

## 2020-12-29 NOTE — Evaluation (Signed)
Occupational Therapy Evaluation and Discharge Patient Details Name: Sandy Carter MRN: [id number] DOB: 05-Jun-2001 Today's Date: 12/29/2020    History of Present Illness Pt is a 20 y/o female who presents s/p L5-S1 laminectomy/decompression on 12/28/2020. PMH significant for TB before age 54, questionable seizures, hyperthyroidism, HA, dysrhythmia.   Clinical Impression   This 20 yo female admitted and underwent above presents to acute OT with all education completed, we will D/C from acute OT.    Follow Up Recommendations  No OT follow up;Supervision - Intermittent    Equipment Recommendations  None recommended by OT       Precautions / Restrictions Precautions Precautions: Fall;Back Precaution Booklet Issued: Yes (comment) Precaution Comments: Reviewed handout and pt was cued for precautions during functional mobility. Required Braces or Orthoses:  ("No brace needed" order) Restrictions Weight Bearing Restrictions: No      Mobility Bed Mobility Overal bed mobility: Modified Independent    Transfers Overall transfer level: Modified independent Equipment used: None Transfers: Sit to/from Stand Sit to Stand: Modified independent (Device/Increase time)         General transfer comment: increased time, cued for better technique for sit<>stand to keep back more straight    Balance Overall balance assessment: Mild deficits observed, not formally tested                       ADL either performed or assessed with clinical judgement   ADL Overall ADL's : Needs assistance/impaired Eating/Feeding: Independent;Sitting   Grooming: Modified independent;Standing Grooming Details (indicate cue type and reason): Educated on use of two cups to brush teeth to avoid bending over sink Upper Body Bathing: Independent;Sitting   Lower Body Bathing: Modified independent;Sit to/from stand   Upper Body Dressing : Independent;Sitting   Lower Body Dressing: Modified  independent;Sit to/from stand   Toilet Transfer: Modified Independent;Ambulation   Toileting- Clothing Manipulation and Hygiene: Independent;Sit to/from stand Toileting - Clothing Manipulation Details (indicate cue type and reason): Educated on use of wet wipes for back pericare             Vision Patient Visual Report: No change from baseline              Pertinent Vitals/Pain Pain Assessment: 0-10 Pain Score: 6  Faces Pain Scale: Hurts little more Pain Location: Incision site Pain Descriptors / Indicators: Operative site guarding;Grimacing Pain Intervention(s): Limited activity within patient's tolerance;Monitored during session;Repositioned     Hand Dominance Right   Extremity/Trunk Assessment Upper Extremity Assessment Upper Extremity Assessment: Overall WFL for tasks assessed     Communication Communication Communication: No difficulties   Cognition Arousal/Alertness: Awake/alert Behavior During Therapy: WFL for tasks assessed/performed Overall Cognitive Status: Within Functional Limits for tasks assessed                                                Home Living Family/patient expects to be discharged to:: Private residence Living Arrangements: Spouse/significant other Available Help at Discharge: Friend(s);Available PRN/intermittently (Almost 24 hours between boyfriend and his mother) Type of Home: House Home Access: Stairs to enter [employer] of Steps: 3-4   Home Layout: Two level Alternate Level Stairs-Number of Steps: flight   Bathroom Shower/Tub: Producer, television/film/video: Standard     Home Equipment: None          Prior Functioning/Environment Level  of Independence: Independent                 OT Problem List: Decreased range of motion;Pain         OT Goals(Current goals can be found in the care plan section) Acute Rehab OT Goals Patient Stated Goal: home today                AM-PAC  OT "6 Clicks" Daily Activity     Outcome Measure Help from another person eating meals?: None Help from another person taking care of personal grooming?: None Help from another person toileting, which includes using toliet, bedpan, or urinal?: None Help from another person bathing (including washing, rinsing, drying)?: None Help from another person to put on and taking off regular upper body clothing?: None Help from another person to put on and taking off regular lower body clothing?: None 6 Click Score: 24   End of Session Nurse Communication:  (no further OT needs)  Activity Tolerance: Patient tolerated treatment well Patient left:  (up in room)  OT Visit Diagnosis: Unsteadiness on feet (R26.81);Pain Pain - part of body:  (incisional)                Time: 0730-0800 OT Time Calculation (min): 30 min Charges:  OT General Charges $OT Visit: 1 Visit OT Evaluation $OT Eval Moderate Complexity: 1 Mod OT Treatments $Self Care/Home Management : 8-22 mins  Sandy Carter, OTR/L Acute [employer] Pager [phone] Office [phone]     Evette Georges 12/29/2020, 10:46 AM

## 2020-12-30 ENCOUNTER — Encounter (HOSPITAL_COMMUNITY): Payer: Self-pay | Admitting: Neurosurgery

## 2021-01-05 ENCOUNTER — Other Ambulatory Visit: Payer: Self-pay

## 2021-01-05 ENCOUNTER — Emergency Department (HOSPITAL_COMMUNITY): Payer: Medicaid Other

## 2021-01-05 ENCOUNTER — Encounter (HOSPITAL_COMMUNITY): Payer: Self-pay

## 2021-01-05 ENCOUNTER — Emergency Department (HOSPITAL_COMMUNITY)
Admission: EM | Admit: 2021-01-05 | Discharge: 2021-01-05 | Disposition: A | Discharge status: Home / Self Care | Payer: Medicaid Other | Attending: Emergency Medicine | Admitting: Emergency Medicine

## 2021-01-05 DIAGNOSIS — R112 Nausea with vomiting, unspecified: Secondary | ICD-10-CM | POA: Diagnosis not present

## 2021-01-05 DIAGNOSIS — R0602 Shortness of breath: Secondary | ICD-10-CM | POA: Diagnosis not present

## 2021-01-05 DIAGNOSIS — R072 Precordial pain: Secondary | ICD-10-CM

## 2021-01-05 DIAGNOSIS — Z20822 Contact with and (suspected) exposure to covid-19: Secondary | ICD-10-CM | POA: Diagnosis not present

## 2021-01-05 LAB — COMPREHENSIVE METABOLIC PANEL
ALT: 12 U/L (ref 0–44)
AST: 18 U/L (ref 15–41)
Albumin: 4.8 g/dL (ref 3.5–5.0)
Alkaline Phosphatase: 70 U/L (ref 38–126)
Anion gap: 8 (ref 5–15)
BUN: 22 mg/dL — ABNORMAL HIGH (ref 6–20)
CO2: 24 mmol/L (ref 22–32)
Calcium: 9.9 mg/dL (ref 8.9–10.3)
Chloride: 102 mmol/L (ref 98–111)
Creatinine, Ser: 0.66 mg/dL (ref 0.44–1.00)
GFR, Estimated: >60 mL/min (ref >60)
Glucose, Bld: 102 mg/dL — ABNORMAL HIGH (ref 70–99)
Potassium: 3.8 mmol/L (ref 3.5–5.1)
Sodium: 134 mmol/L — ABNORMAL LOW (ref 135–145)
Total Bilirubin: 1.4 mg/dL — ABNORMAL HIGH (ref 0.3–1.2)
Total Protein: 8.4 g/dL — ABNORMAL HIGH (ref 6.5–8.1)

## 2021-01-05 LAB — CBC WITH DIFFERENTIAL/PLATELET
Abs Immature Granulocytes: 0.03 10*3/uL (ref 0.00–0.07)
Basophils Absolute: 0 10*3/uL (ref 0.0–0.1)
Basophils Relative: 0 %
Eosinophils Absolute: 0 10*3/uL (ref 0.0–0.5)
Eosinophils Relative: 0 %
HCT: 40.4 % (ref 36.0–46.0)
Hemoglobin: 14.4 g/dL (ref 12.0–15.0)
Immature Granulocytes: 0 %
Lymphocytes Relative: 43 %
Lymphs Abs: 4 10*3/uL (ref 0.7–4.0)
MCH: 31 pg (ref 26.0–34.0)
MCHC: 35.6 g/dL (ref 30.0–36.0)
MCV: 87.1 fL (ref 80.0–100.0)
Monocytes Absolute: 0.8 10*3/uL (ref 0.1–1.0)
Monocytes Relative: 9 %
Neutro Abs: 4.4 10*3/uL (ref 1.7–7.7)
Neutrophils Relative %: 48 %
Platelets: 403 10*3/uL — ABNORMAL HIGH (ref 150–400)
RBC: 4.64 MIL/uL (ref 3.87–5.11)
RDW: 11.8 % (ref 11.5–15.5)
WBC: 9.3 10*3/uL (ref 4.0–10.5)
nRBC: 0 % (ref 0.0–0.2)

## 2021-01-05 LAB — HCG, QUANTITATIVE, PREGNANCY: hCG, Beta Chain, Quant, S: <1 m[IU]/mL (ref <5)

## 2021-01-05 LAB — TROPONIN I (HIGH SENSITIVITY)
Troponin I (High Sensitivity): 4 ng/L (ref <18)
Troponin I (High Sensitivity): 5 ng/L (ref <18)

## 2021-01-05 LAB — RESP PANEL BY RT-PCR (FLU A&B, COVID) ARPGX2
Influenza A by PCR: NEGATIVE
Influenza B by PCR: NEGATIVE
SARS Coronavirus 2 by RT PCR: NEGATIVE

## 2021-01-05 LAB — LIPASE, BLOOD: Lipase: 33 U/L (ref 11–51)

## 2021-01-05 MED ORDER — PROMETHAZINE HCL 25 MG PO TABS: 25.0000 mg | ORAL_TABLET | Freq: Four times a day (QID) | ORAL | 0 refills | Status: DC | PRN

## 2021-01-05 MED ORDER — ONDANSETRON HCL 4 MG/2ML IJ SOLN
4.0000 mg | Freq: Once | INTRAMUSCULAR | Status: AC
  Administered 2021-01-05: 4 mg via INTRAVENOUS
  Filled 2021-01-05: qty 2

## 2021-01-05 MED ORDER — SODIUM CHLORIDE 0.9 % IV BOLUS
1000.0000 mL | Freq: Once | INTRAVENOUS | Status: AC
  Administered 2021-01-05: 1000 mL via INTRAVENOUS

## 2021-01-05 MED ORDER — PROMETHAZINE HCL 12.5 MG PO TABS
12.5000 mg | ORAL_TABLET | Freq: Once | ORAL | Status: AC
  Administered 2021-01-05: 12.5 mg via ORAL
  Filled 2021-01-05: qty 1

## 2021-01-05 MED ORDER — SODIUM CHLORIDE 0.9 % IV SOLN
12.5000 mg | Freq: Four times a day (QID) | INTRAVENOUS | Status: DC | PRN
  Filled 2021-01-05: qty 0.5

## 2021-01-05 MED ORDER — ONDANSETRON 4 MG PO TBDP: 4.0000 mg | ORAL_TABLET | Freq: Three times a day (TID) | ORAL | 0 refills | Status: DC | PRN

## 2021-01-05 MED ORDER — IOHEXOL 350 MG/ML SOLN
75.0000 mL | Freq: Once | INTRAVENOUS | Status: AC | PRN
  Administered 2021-01-05: 75 mL via INTRAVENOUS

## 2021-01-05 MED ORDER — MORPHINE SULFATE (PF) 4 MG/ML IV SOLN
4.0000 mg | Freq: Once | INTRAVENOUS | Status: AC
  Administered 2021-01-05: 4 mg via INTRAVENOUS
  Filled 2021-01-05: qty 1

## 2021-01-05 NOTE — ED Provider Notes (Signed)
Emergency Department Provider Note   I have reviewed the triage vital signs and the nursing notes.   HISTORY  Chief Complaint Back Pain and Nausea   HPI Sandy Carter is a 20 y.o. female with past medical history reviewed below including L5/S1 microdiscectomy with Dr. Franky Macho on 4/22 presents with nausea, vomiting, chest pain, and shortness of breath.  Her surgery went without immediate complication.  She did have some postoperative nausea and vomiting and stayed overnight for observation.  She has been taking her Zofran but ran out of medication.  She developed a sharp central to slightly right-sided chest pain which is intermittent and feels like she cannot get a full breath in.  She denies any particular pleuritic pain.  No fevers or chills.  She is urinating and having bowel movements sometimes up to 2 times daily.  She is not feeling numbness, weakness in the legs.  She does have some occasional pain in the right leg which was the case preop but notes this is mainly at night and not occurring currently.  She denies any unilateral leg swelling. She attempted to contact her surgeon but got no answer so saw a telehealth MD and was referred to the ED.   Past Medical History:  Diagnosis Date  . ADD (attention deficit disorder)     in her teens  . Anxiety   . Depression   . Dysrhythmia    palpitations - from Hyperthyroidism  . Headache   . Hyperthyroidism    not treated at this time  . Lumbar herniated disc   . ODD (oppositional defiant disorder)    in her teens  . Seizures (HCC)    shakes alot, is aware of what is going on, feels them coming on, has not seen a neurologist  . Tuberculosis    before age 56    Patient Active Problem List   Diagnosis Date Noted  . HNP (herniated nucleus pulposus), lumbar 12/28/2020  . Rectal bleeding 08/13/2020  . Pelvic pain 08/13/2020  . Nausea and vomiting 08/13/2020  . Itchy eyes 02/16/2016  . MDD (major depressive disorder), recurrent  severe, 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  . Disruptive mood dysregulation disorder (HCC) 06/10/2014  . 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 ODD (oppositional defiant disorder) 06/10/2014    Past Surgical History:  Procedure Laterality Date  . COLONOSCOPY WITH PROPOFOL N/A 09/17/2020   Procedure: COLONOSCOPY WITH PROPOFOL;  Surgeon: Lanelle Bal, DO;  Location: [facility];  Service: Endoscopy;  Laterality: N/A;  2:00pm  . LUMBAR LAMINECTOMY/DECOMPRESSION MICRODISCECTOMY Right 12/28/2020   Procedure: Right Lumbar Five Sacral One Microdiscectomy;  Surgeon: Coletta Memos, MD;  Location: [facility] OR;  Service: Neurosurgery;  Laterality: Right;  . WISDOM TOOTH EXTRACTION      Allergies Penicillins  Family History  Adopted: Yes  Problem Relation Age of Onset  . Alcoholism Mother   . Bone cancer Mother   . Diabetes Maternal Grandmother     Social History Social History   Tobacco Use  . Smoking status: Never Smoker  . Smokeless tobacco: Never Used  Vaping Use  . Vaping Use: Former  Substance Use Topics  . Alcohol use: Not Currently  . Drug use: Yes    Types: Marijuana    Comment: trying to quit     Review of Systems  Constitutional: No fever/chills Eyes: No visual changes. ENT: No sore throat. Cardiovascular: Positive chest pain. Respiratory: Positive shortness of breath. Gastrointestinal: No abdominal pain. Positive  nausea and vomiting.  No diarrhea.  No constipation. Genitourinary: Negative for dysuria. Musculoskeletal: Positive for post-operative back pain. Skin: Negative for rash. Neurological: Negative for headaches, focal weakness or numbness.  10-point ROS otherwise negative.  ____________________________________________   PHYSICAL EXAM:  VITAL SIGNS: ED Triage Vitals [01/05/21 1708]  Enc Vitals Group     BP (!) 147/97     Pulse Rate (!) 135     Resp 18     Temp 98.4 F (36.9 C)     Temp Source Oral     SpO2 95 %      Weight 102 lb 8 oz (46.5 kg)     Height 5\' 2"  (1.575 m)   Constitutional: Alert and oriented. Well appearing and in no acute distress. Eyes: Conjunctivae are normal.  Head: Atraumatic. Nose: No congestion/rhinnorhea. Mouth/Throat: Mucous membranes are moist. Neck: No stridor.   Cardiovascular: Tachycardia. Good peripheral circulation. Grossly normal heart sounds.   Respiratory: Normal respiratory effort.  No retractions. Lungs CTAB. Gastrointestinal: Soft and nontender. No distention.  Musculoskeletal: No lower extremity tenderness nor edema. No gross deformities of extremities. Neurologic:  Normal speech and language. No gross focal neurologic deficits are appreciated. Normal strength and sensation in the bilateral lower extremities. 2+ patellar reflexes. Normal sensation bilaterally.  Skin:  Skin is warm, dry and intact. Lumbar spine incision is well appearing with tissue adhesive. No surrounding erythema, drainage, or bleeding.   ____________________________________________   LABS (all labs ordered are listed, but only abnormal results are displayed)  Labs Reviewed  COMPREHENSIVE METABOLIC PANEL - Abnormal; Notable for the following components:      Result Value   Sodium 134 (*)    Glucose, Bld 102 (*)    BUN 22 (*)    Total Protein 8.4 (*)    Total Bilirubin 1.4 (*)    All other components within normal limits  CBC WITH DIFFERENTIAL/PLATELET - Abnormal; Notable for the following components:   Platelets 403 (*)    All other components within normal limits  RESP PANEL BY RT-PCR (FLU A&B, COVID) ARPGX2  LIPASE, BLOOD  HCG, QUANTITATIVE, PREGNANCY  TROPONIN I (HIGH SENSITIVITY)  TROPONIN I (HIGH SENSITIVITY)   ____________________________________________  EKG   EKG Interpretation  Date/Time:  Saturday January 05 2021 17:36:41 EDT Ventricular Rate:  150 PR Interval:  118 QRS Duration: 62 QT Interval:  289 QTC Calculation: 457 R Axis:   77 Text Interpretation: Sinus  tachycardia Consider right atrial enlargement Borderline repolarization abnormality Confirmed by Alona BeneLong, Malorie Bigford [phone]([id number]) on 01/05/2021 5:40:35 PM       ____________________________________________  RADIOLOGY  CT Angio Chest PE W and/or Wo Contrast  Result Date: 01/05/2021 CLINICAL DATA:  Recent back surgery. Persistent nausea and vomiting. Chest pain. EXAM: CT ANGIOGRAPHY CHEST WITH CONTRAST TECHNIQUE: Multidetector CT imaging of the chest was performed using the standard protocol during bolus administration of intravenous contrast. Multiplanar CT image reconstructions and MIPs were obtained to evaluate the vascular anatomy. CONTRAST:  75mL OMNIPAQUE IOHEXOL 350 MG/ML SOLN COMPARISON:  None. FINDINGS: Cardiovascular: No pulmonary embolism within the main, lobar or segmental pulmonary arteries bilaterally. No thoracic aortic aneurysm or evidence of aortic dissection. Heart size appears normal. No pericardial effusion. Mediastinum/Nodes: No mass or enlarged lymph nodes are seen within the mediastinum. Esophagus is unremarkable. Trachea and central bronchi are unremarkable. Lungs/Pleura: Lungs are clear.  No pleural effusion or pneumothorax. Upper Abdomen: Limited images of the upper abdomen are unremarkable. Musculoskeletal: Osseous structures of the chest and upper abdomen are normal.  Superficial soft tissues of the chest wall are unremarkable Review of the MIP images confirms the above findings. IMPRESSION: Normal exam. No pulmonary embolism. Lungs are clear. Electronically Signed   By: Bary Richard M.D.   On: 01/05/2021 19:28   DG Chest Portable 1 View  Result Date: 01/05/2021 CLINICAL DATA:  Acute chest pain for 3 days. EXAM: PORTABLE CHEST 1 VIEW COMPARISON:  None. FINDINGS: The cardiomediastinal silhouette is unremarkable. There is no evidence of focal airspace disease, pulmonary edema, suspicious pulmonary nodule/mass, pleural effusion, or pneumothorax. No acute bony abnormalities are identified.  IMPRESSION: No active disease. Electronically Signed   By: Harmon Pier M.D.   On: 01/05/2021 18:19    ____________________________________________   PROCEDURES  Procedure(s) performed:   Procedures  None  ____________________________________________   INITIAL IMPRESSION / ASSESSMENT AND PLAN / ED COURSE  Pertinent labs & imaging results that were available during my care of the patient were reviewed by me and considered in my medical decision making (see chart for details).   Patient presents to the emergency department 5 days status post microdiscectomy at L5-S1 with Dr. Franky Macho.  She is having nausea vomiting but no focal tenderness on exam.  Her incision is well-appearing and her neurologic exam is normal.  She is having some atypical pain in the chest and feeling short of breath.  She arrives very tachycardic which worsens with standing and she feels lightheaded.  Plan for IV fluids and Zofran.  PE is a consideration.  She is afebrile and having bowel movements regularly.  We will reassess after labs but patient will likely require at least CT of the chest to look for PE in the post-op setting.   08:45 PM  On assessment of the patient's blood work her COVID and flu tests are negative.  Troponin is negative with chest pain.  She is not having acute kidney injury.  Lipase is normal.  No leukocytosis.  Hemoglobin is within normal limits.  She is not pregnant.  Patient is high risk for PE and CTA of the chest obtained in that setting showing no PE or other acute abnormality in the chest.  Upper abdomen is partially visualized on the scan and also within normal limits.  Her heart rate has improved significantly with IV fluids.  Patient has been eating here without vomiting and is overall feeling better.  She will call her surgery and PCP teams on Monday.  I discharged her home with prescriptions for Zofran as well as Phenergan.  She will decide which one works best for her and take that one  primarily.  She was advised to not take both medications simultaneously.  Discussed ED return precautions and provided this in writing as well.  Patient has a sober driver home after receiving phenergan.  ____________________________________________  FINAL CLINICAL IMPRESSION(S) / ED DIAGNOSES  Final diagnoses:  Non-intractable vomiting with nausea, unspecified vomiting type  Precordial pain  SOB (shortness of breath)     MEDICATIONS GIVEN DURING THIS VISIT:  Medications  sodium chloride 0.9 % bolus 1,000 mL (0 mLs Intravenous Stopped 01/05/21 1824)  ondansetron (ZOFRAN) injection 4 mg (4 mg Intravenous Given 01/05/21 1751)  morphine 4 MG/ML injection 4 mg (4 mg Intravenous Given 01/05/21 1824)  iohexol (OMNIPAQUE) 350 MG/ML injection 75 mL (75 mLs Intravenous Contrast Given 01/05/21 1905)  sodium chloride 0.9 % bolus 1,000 mL (0 mLs Intravenous Stopped 01/05/21 2105)  promethazine (PHENERGAN) tablet 12.5 mg (12.5 mg Oral Given 01/05/21 2104)  NEW OUTPATIENT MEDICATIONS STARTED DURING THIS VISIT:  New Prescriptions   ONDANSETRON (ZOFRAN ODT) 4 MG DISINTEGRATING TABLET    Take 1 tablet (4 mg total) by mouth every 8 (eight) hours as needed.   PROMETHAZINE (PHENERGAN) 25 MG TABLET    Take 1 tablet (25 mg total) by mouth every 6 (six) hours as needed for nausea or vomiting.    Note:  This document was prepared using Dragon voice recognition software and may include unintentional dictation errors.  Alona Bene, MD, [facility] Emergency Medicine   Travor Royce, Arlyss Repress, MD 01/05/21 2112

## 2021-01-05 NOTE — ED Triage Notes (Signed)
Pt to er, pt states that she had surgery on her back last Friday, states that since then she has been having a lot of nausea and vomiting.  Pt states that she has also been having some chest pain, states that the chest pain started about 3 days ago.  States that her incision is also an odd color.

## 2021-01-05 NOTE — Discharge Instructions (Signed)
You were seen in the emergency room today with nausea and vomiting along with some chest discomfort and shortness of breath.  Your lab work here is reassuring and your heart rate is improved with IV fluids.  I suspect that you were fairly dehydrated.  I have called into nausea medications to your pharmacy.  I have called in both Zofran and Phenergan.  He cannot take both of these but if you find that one works better than the other you can primarily take either medication as directed.   Please make your surgery team that you were in the emergency department tonight.  If you develop any severe back pain not responding to your pain medicines, numbness/weakness in the legs, or fevers you should return to the emergency department.   Please contact your primary care doctor by phone to schedule a follow-up appointment coming week.

## 2021-03-13 ENCOUNTER — Other Ambulatory Visit: Payer: Self-pay

## 2021-03-13 ENCOUNTER — Ambulatory Visit (INDEPENDENT_AMBULATORY_CARE_PROVIDER_SITE_OTHER): Payer: Medicaid Other | Admitting: Advanced Practice Midwife

## 2021-03-13 ENCOUNTER — Encounter: Payer: Self-pay | Admitting: Advanced Practice Midwife

## 2021-03-13 VITALS — BP 138/88 | HR 108 | Ht 62.0 in | Wt 102.0 lb

## 2021-03-13 DIAGNOSIS — Z3201 Encounter for pregnancy test, result positive: Secondary | ICD-10-CM | POA: Diagnosis not present

## 2021-03-13 DIAGNOSIS — Z3490 Encounter for supervision of normal pregnancy, unspecified, unspecified trimester: Secondary | ICD-10-CM | POA: Diagnosis not present

## 2021-03-13 DIAGNOSIS — Z3200 Encounter for pregnancy test, result unknown: Secondary | ICD-10-CM

## 2021-03-13 LAB — POCT URINE PREGNANCY: Preg Test, Ur: POSITIVE — AB

## 2021-03-13 NOTE — Patient Instructions (Signed)

## 2021-03-13 NOTE — Progress Notes (Signed)
   GYN VISIT Patient name: Sandy Carter MRN [id number]  Date of birth: 29-Aug-2001 Chief Complaint:   New Patient (Initial Visit) and Possible Pregnancy  History of Present Illness:   Sandy Carter is a 20 y.o. G1P0 Caucasian female being seen today for +UPT.    Patient's last menstrual period was 01/14/2021 (exact date). The current method of family planning is  Pregnant .  Last pap never.   Depression screen PHQ 2/9 03/13/2021  Decreased Interest 1  Down, Depressed, Hopeless 2  PHQ - 2 Score 3  Altered sleeping 3  Tired, decreased energy 3  Change in appetite 0  Feeling bad or failure about yourself  0  Trouble concentrating 0  Moving slowly or fidgety/restless 0  Suicidal thoughts 0  PHQ-9 Score 9     GAD 7 : Generalized Anxiety Score 03/13/2021  Nervous, Anxious, on Edge 2  Control/stop worrying 3  Worry too much - different things 2  Trouble relaxing 0  Restless 0  Easily annoyed or irritable 2  Afraid - awful might happen 0  Total GAD 7 Score 9     Review of Systems:   Pertinent items are noted in HPI Denies fever/chills, dizziness, headaches, visual disturbances, fatigue, shortness of breath, chest pain, abdominal pain, vomiting, abnormal vaginal discharge/itching/odor/irritation, problems with periods, bowel movements, urination, or intercourse unless otherwise stated above.  Pertinent History Reviewed:  Reviewed past medical,surgical, social, obstetrical and family history.  Reviewed problem list, medications and allergies. Physical Assessment:   Vitals:   03/13/21 1355  BP: 138/88  Pulse: (!) 108  Weight: 102 lb (46.3 kg)  Height: 5\' 2"  (1.575 m)  Body mass index is 18.66 kg/m.       Physical Examination:   General appearance: alert, well appearing, and in no distress  Mental status: alert, oriented to person, place, and time  Skin: warm & dry   Cardiovascular: normal heart rate noted  Respiratory: normal respiratory effort, no distress  Abdomen:  soft, non-tender   Pelvic: examination not indicated  Extremities: no edema    Results for orders placed or performed in visit on 03/13/21 (from the past 24 hour(s))  POCT urine pregnancy   Collection Time: 03/13/21  1:56 PM  Result Value Ref Range   Preg Test, Ur Positive (A) Negative    Assessment & Plan:  1) Early preg> +UPT; LMP 01/14/21; will schedule for first avail IUP confirmation  2) Due for first Pap> will get with new ob visit  Meds: No orders of the defined types were placed in this encounter.   Orders Placed This Encounter  Procedures   03/16/21 OB Comp Less 14 Wks   US OB Transvaginal   POCT urine pregnancy    Return for Preg confirmation u/s first avail.  [place] CNM 03/13/2021 2:11 PM

## 2021-03-18 ENCOUNTER — Encounter (HOSPITAL_COMMUNITY): Payer: Self-pay | Admitting: *Deleted

## 2021-03-18 ENCOUNTER — Other Ambulatory Visit: Payer: Self-pay

## 2021-03-18 ENCOUNTER — Emergency Department (HOSPITAL_COMMUNITY)
Admission: EM | Admit: 2021-03-18 | Discharge: 2021-03-18 | Disposition: A | Discharge status: Home / Self Care | Payer: Medicaid Other | Attending: Emergency Medicine | Admitting: Emergency Medicine

## 2021-03-18 ENCOUNTER — Emergency Department (HOSPITAL_COMMUNITY): Payer: Medicaid Other

## 2021-03-18 DIAGNOSIS — M5441 Lumbago with sciatica, right side: Secondary | ICD-10-CM

## 2021-03-18 DIAGNOSIS — M5459 Other low back pain: Secondary | ICD-10-CM | POA: Diagnosis not present

## 2021-03-18 DIAGNOSIS — U071 COVID-19: Secondary | ICD-10-CM | POA: Insufficient documentation

## 2021-03-18 DIAGNOSIS — M5442 Lumbago with sciatica, left side: Secondary | ICD-10-CM

## 2021-03-18 DIAGNOSIS — R000 Tachycardia, unspecified: Secondary | ICD-10-CM | POA: Diagnosis not present

## 2021-03-18 DIAGNOSIS — Z3A12 12 weeks gestation of pregnancy: Secondary | ICD-10-CM | POA: Diagnosis not present

## 2021-03-18 DIAGNOSIS — O98511 Other viral diseases complicating pregnancy, first trimester: Secondary | ICD-10-CM | POA: Insufficient documentation

## 2021-03-18 DIAGNOSIS — R109 Unspecified abdominal pain: Secondary | ICD-10-CM

## 2021-03-18 DIAGNOSIS — Z87891 Personal history of nicotine dependence: Secondary | ICD-10-CM | POA: Diagnosis not present

## 2021-03-18 DIAGNOSIS — O26891 Other specified pregnancy related conditions, first trimester: Secondary | ICD-10-CM | POA: Diagnosis present

## 2021-03-18 LAB — CBC WITH DIFFERENTIAL/PLATELET
Abs Immature Granulocytes: 0.01 10*3/uL (ref 0.00–0.07)
Basophils Absolute: 0 10*3/uL (ref 0.0–0.1)
Basophils Relative: 0 %
Eosinophils Absolute: 0 10*3/uL (ref 0.0–0.5)
Eosinophils Relative: 0 %
HCT: 31.8 % — ABNORMAL LOW (ref 36.0–46.0)
Hemoglobin: 11.3 g/dL — ABNORMAL LOW (ref 12.0–15.0)
Immature Granulocytes: 0 %
Lymphocytes Relative: 16 %
Lymphs Abs: 0.6 10*3/uL — ABNORMAL LOW (ref 0.7–4.0)
MCH: 31.5 pg (ref 26.0–34.0)
MCHC: 35.5 g/dL (ref 30.0–36.0)
MCV: 88.6 fL (ref 80.0–100.0)
Monocytes Absolute: 0.6 10*3/uL (ref 0.1–1.0)
Monocytes Relative: 15 %
Neutro Abs: 2.9 10*3/uL (ref 1.7–7.7)
Neutrophils Relative %: 69 %
Platelets: 219 10*3/uL (ref 150–400)
RBC: 3.59 MIL/uL — ABNORMAL LOW (ref 3.87–5.11)
RDW: 11.2 % — ABNORMAL LOW (ref 11.5–15.5)
WBC: 4.1 10*3/uL (ref 4.0–10.5)
nRBC: 0 % (ref 0.0–0.2)

## 2021-03-18 LAB — URINALYSIS, ROUTINE W REFLEX MICROSCOPIC
Bilirubin Urine: NEGATIVE
Glucose, UA: NEGATIVE mg/dL
Hgb urine dipstick: NEGATIVE
Ketones, ur: 80 mg/dL — AB
Leukocytes,Ua: NEGATIVE
Nitrite: NEGATIVE
Protein, ur: NEGATIVE mg/dL
Specific Gravity, Urine: 1.025 (ref 1.005–1.030)
pH: 5 (ref 5.0–8.0)

## 2021-03-18 LAB — RESP PANEL BY RT-PCR (FLU A&B, COVID) ARPGX2
Influenza A by PCR: NEGATIVE
Influenza B by PCR: NEGATIVE
SARS Coronavirus 2 by RT PCR: POSITIVE — AB

## 2021-03-18 LAB — COMPREHENSIVE METABOLIC PANEL
ALT: 11 U/L (ref 0–44)
AST: 17 U/L (ref 15–41)
Albumin: 4.3 g/dL (ref 3.5–5.0)
Alkaline Phosphatase: 51 U/L (ref 38–126)
Anion gap: 8 (ref 5–15)
BUN: 13 mg/dL (ref 6–20)
CO2: 23 mmol/L (ref 22–32)
Calcium: 9.4 mg/dL (ref 8.9–10.3)
Chloride: 104 mmol/L (ref 98–111)
Creatinine, Ser: 0.61 mg/dL (ref 0.44–1.00)
GFR, Estimated: >60 mL/min (ref >60)
Glucose, Bld: 89 mg/dL (ref 70–99)
Potassium: 3.5 mmol/L (ref 3.5–5.1)
Sodium: 135 mmol/L (ref 135–145)
Total Bilirubin: 0.8 mg/dL (ref 0.3–1.2)
Total Protein: 7.4 g/dL (ref 6.5–8.1)

## 2021-03-18 LAB — I-STAT BETA HCG BLOOD, ED (MC, WL, AP ONLY): I-stat hCG, quantitative: >2000 m[IU]/mL — ABNORMAL HIGH (ref <5)

## 2021-03-18 LAB — LACTIC ACID, PLASMA: Lactic Acid, Venous: 1 mmol/L (ref 0.5–1.9)

## 2021-03-18 MED ORDER — SODIUM CHLORIDE 0.9 % IV BOLUS
1000.0000 mL | Freq: Once | INTRAVENOUS | Status: AC
  Administered 2021-03-18: 1000 mL via INTRAVENOUS

## 2021-03-18 NOTE — ED Provider Notes (Signed)
[facility] EMERGENCY DEPARTMENT Provider Note   CSN: [id number] Arrival date & time: 03/18/21  1159     History No chief complaint on file.   Sandy Carter is a 20 y.o. female.  HPI      Sandy Carter is a 20 y.o. female G1, P0 at [redacted] weeks gestation by LMP.  Who presents to the Emergency Department complaining of bilateral leg pain and low back pain.  She had L5-S1 microdiscectomy by Dr. Franky Macho in April 2022.  She describes having sharp aching pains radiating from her buttocks into her hips and legs.  States pain is similar to the symptoms she was having prior to her surgery.  States this pain is worse and constant.  She is unable to obtain relief from position change or Tylenol.  She also reports some abdominal discomfort and nausea vomiting yesterday.  None today. No pelvic pain or vaginal bleeding. She had confirmation of her pregnancy last week with family tree and is scheduled for OB ultrasound on 03/22/2021.  She also reports some generalized malaise, low-grade fever.  No known COVID exposures, but states her boyfriend has recently had similar symptoms.  She denies diarrhea, headache, neck pain or stiffness, chest pain, shortness of breath and cough. No numbness or weakness of the lower extremities, urine or bowel changes   Past Medical History:  Diagnosis Date   ADD (attention deficit disorder)     in her teens   Anxiety    Depression    Dysrhythmia    palpitations - from Hyperthyroidism   Headache    Hyperthyroidism    not treated at this time   Lumbar herniated disc    ODD (oppositional defiant disorder)    in her teens   Seizures (HCC)    shakes alot, is aware of what is going on, feels them coming on, has not seen a neurologist   Tuberculosis    before age 89    Patient Active Problem List   Diagnosis Date Noted   HNP (herniated nucleus pulposus), lumbar 12/28/2020   Rectal bleeding 08/13/2020   MDD (major depressive disorder), recurrent severe, without  psychosis (HCC) 02/11/2016   Disruptive mood dysregulation disorder (HCC) 06/10/2014   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   ODD (oppositional defiant disorder) 06/10/2014    Past Surgical History:  Procedure Laterality Date   COLONOSCOPY WITH PROPOFOL N/A 09/17/2020   Procedure: COLONOSCOPY WITH PROPOFOL;  Surgeon: Lanelle Bal, DO;  Location: [facility]ITE;  Service: Endoscopy;  Laterality: N/A;  2:00pm   LUMBAR LAMINECTOMY/DECOMPRESSION MICRODISCECTOMY Right 12/28/2020   Procedure: Right Lumbar Five Sacral One Microdiscectomy;  Surgeon: Coletta Memos, MD;  Location: [facility] OR;  Service: Neurosurgery;  Laterality: Right;   WISDOM TOOTH EXTRACTION       OB History     Gravida  1   Para      Term      Preterm      AB      Living         SAB      IAB      Ectopic      Multiple      Live Births              Family History  Adopted: Yes  Problem Relation Age of Onset   Alcoholism Mother    Bone cancer Mother    Diabetes Maternal Grandmother     Social History   Tobacco Use  Smoking status: Never   Smokeless tobacco: Never  Vaping Use   Vaping Use: Former  Substance Use Topics   Alcohol use: Not Currently   Drug use: Not Currently    Types: Marijuana    Comment: trying to quit     Home Medications Prior to Admission medications   Medication Sig Start Date End Date Taking? Authorizing Provider  diclofenac Sodium (VOLTAREN) 1 % GEL Apply 1 application topically 2 (two) times daily as needed (swelling/ joint an muscle pain). 09/26/20   [provider]  hydrOXYzine (ATARAX/VISTARIL) 50 MG tablet Take 50 mg by mouth every 6 (six) hours as needed for anxiety or itching (Seizures). 09/26/20   [provider]  ondansetron (ZOFRAN ODT) 4 MG disintegrating tablet Take 1 tablet (4 mg total) by mouth every 8 (eight) hours as needed. Patient not taking: Reported on 03/13/2021 01/05/21   Long, Arlyss RepressJoshua G, MD  ondansetron  (ZOFRAN) 4 MG tablet Take 1 tablet (4 mg total) by mouth every 6 (six) hours. Patient not taking: Reported on 03/13/2021 04/14/20   Rennis HardingWurst, Brittany, PA-C  PROAIR HFA 108 [phone](90 Base) MCG/ACT inhaler Inhale 1 puff into the lungs every 6 (six) hours as needed for wheezing or shortness of breath. 06/19/20   [provider]  promethazine (PHENERGAN) 25 MG tablet Take 1 tablet (25 mg total) by mouth every 6 (six) hours as needed for nausea or vomiting. Patient not taking: Reported on 03/13/2021 01/05/21   Long, Arlyss RepressJoshua G, MD  triamcinolone ointment (KENALOG) 0.1 % Apply 1 application topically 2 (two) times daily as needed (Skin bumps). 09/26/20   [provider]  ARIPiprazole (ABILIFY) 5 MG tablet Take 1 tablet (5 mg total) by mouth 2 (two) times daily. 02/20/16 04/14/20  Denzil Magnusonhomas, Lashunda, NP  etonogestrel (NEXPLANON) 68 MG IMPL implant 1 each by Subdermal route once. Implanted 02/05/16  04/14/20  [provider]  FLUoxetine (PROZAC) 40 MG capsule Take 1 capsule (40 mg total) by mouth daily. 02/20/16 04/14/20  Denzil Magnusonhomas, Lashunda, NP  lamoTRIgine (LAMICTAL) 200 MG tablet Take 1 tablet (200 mg total) by mouth every evening. 02/20/16 04/14/20  Denzil Magnusonhomas, Lashunda, NP  prazosin (MINIPRESS) 1 MG capsule Take 1 capsule (1 mg total) by mouth at bedtime. 02/20/16 04/14/20  Denzil Magnusonhomas, Lashunda, NP    Allergies    Penicillins  Review of Systems   Review of Systems  Constitutional:  Positive for fatigue and fever.  HENT:  Negative for congestion.   Respiratory:  Negative for cough and shortness of breath.   Cardiovascular:  Negative for chest pain.  Gastrointestinal:  Positive for abdominal pain, nausea and vomiting. Negative for constipation and diarrhea.  Genitourinary:  Negative for decreased urine volume, difficulty urinating, dysuria, flank pain and hematuria.  Musculoskeletal:  Positive for back pain and myalgias. Negative for joint swelling.  Skin:  Negative for rash.  Neurological:  Negative for  dizziness, syncope, weakness and numbness.   Physical Exam Updated Vital Signs BP 110/78   Pulse (!) 116   Temp 99.5 F (37.5 C) (Oral)   Resp 18   Ht 5\' 2"  (1.575 m)   Wt 45.5 kg   LMP 01/14/2021 (Exact Date)   SpO2 96%   BMI 18.34 kg/m   Physical Exam Vitals and nursing note reviewed.  Constitutional:      Appearance: Normal appearance. She is not ill-appearing or toxic-appearing.  HENT:     Head: Normocephalic.     Mouth/Throat:     Mouth: Mucous membranes are dry.  Eyes:     Conjunctiva/sclera: Conjunctivae normal.     Pupils: Pupils are equal, round, and reactive to light.  Neck:     Meningeal: Kernig's sign absent.  Cardiovascular:     Rate and Rhythm: Regular rhythm. Tachycardia present.     Pulses: Normal pulses.  Pulmonary:     Effort: Pulmonary effort is normal.     Breath sounds: Normal breath sounds. No wheezing.  Abdominal:     Palpations: Abdomen is soft.     Tenderness: There is no abdominal tenderness. There is no guarding or rebound.  Musculoskeletal:        General: Normal range of motion.     Cervical back: Normal range of motion.     Right lower leg: No edema.     Left lower leg: No edema.     Comments: Tenderness of bilateral lumbar paraspinal muscles.  5/5 strength of BLE's.  Surgical scar appears well healed, no erythema  Skin:    General: Skin is warm.     Capillary Refill: Capillary refill takes less than 2 seconds.     Findings: No erythema or rash.  Neurological:     General: No focal deficit present.     Mental Status: She is alert.     Sensory: No sensory deficit.     Motor: No weakness.    ED Results / Procedures / Treatments   Labs (all labs ordered are listed, but only abnormal results are displayed) Labs Reviewed  RESP PANEL BY RT-PCR (FLU A&B, COVID) ARPGX2 - Abnormal; Notable for the following components:      Result Value   SARS Coronavirus 2 by RT PCR POSITIVE (*)    All other components within normal limits  CBC WITH  DIFFERENTIAL/PLATELET - Abnormal; Notable for the following components:   RBC 3.59 (*)    Hemoglobin 11.3 (*)    HCT 31.8 (*)    RDW 11.2 (*)    Lymphs Abs 0.6 (*)    All other components within normal limits  URINALYSIS, ROUTINE W REFLEX MICROSCOPIC - Abnormal; Notable for the following components:   APPearance HAZY (*)    Ketones, ur 80 (*)    All other components within normal limits  I-STAT BETA HCG BLOOD, ED ([facility], [facility], [facility] ONLY) - Abnormal; Notable for the following components:   I-stat hCG, quantitative >2,000.0 (*)    All other components within normal limits  COMPREHENSIVE METABOLIC PANEL  LACTIC ACID, PLASMA    EKG None  Radiology US OB LESS THAN 14 WEEKS WITH OB TRANSVAGINAL  Result Date: 03/18/2021 CLINICAL DATA:  Three weeks of abdominal and back pain, serum beta HCG not available at time of dictation however there is a positive point of care urine pregnancy test on 03/13/2021. EXAM: OBSTETRIC <14 WK [place] AND TRANSVAGINAL OB US TECHNIQUE: Both transabdominal and transvaginal ultrasound examinations were performed for complete evaluation of the gestation as well as the maternal uterus, adnexal regions, and pelvic cul-de-sac. Transvaginal technique was performed to assess early pregnancy. COMPARISON:  None. FINDINGS: Intrauterine gestational sac: Single Yolk sac:  Visualized. Embryo:  Visualized. Cardiac Activity: Visualized. Heart Rate: 137 bpm CRL: 5.2 mm   6 w   2 d                  [place] EDC: November 09, 2021 Subchorionic hemorrhage:  None visualized. Maternal uterus/adnexae: Corpus luteum in the left ovary. Small volume free fluid. IMPRESSION: Single viable intrauterine pregnancy further described above. Electronically  Signed   By: Maudry Mayhew MD   On: 03/18/2021 15:19    Procedures Procedures   Medications Ordered in ED Medications  sodium chloride 0.9 % bolus 1,000 mL (1,000 mLs Intravenous New Bag/Given 03/18/21 1347)    ED Course  I have reviewed the triage vital signs  and the nursing notes.  Pertinent labs & imaging results that were available during my care of the patient were reviewed by me and considered in my medical decision making (see chart for details).    MDM Rules/Calculators/[name]                          On arrival, patient is tachycardic with heart rate at 130s.  Mucous membranes are mildly dry.  She denies any chest pain or shortness of breath.  She was seen here in April and on review of medical records, she was found to be tachycardic with heart rate in the 130s at that time as well.  She underwent cardiac work-up and CT angio of the chest that was negative for pulmonary embolism   On exam today, she does have some radicular symptoms of her lower back.  Surgical scar of her low back appears well-healed.  There is no focal neurodeficits on her exam.  She is nontoxic-appearing.  No red flags on exam to suggest cauda equina or spinal abscess  Will obtain OB ultrasound to r/o ectopic and administer IV fluids, orthostatics and check labs and urinalysis.   Orthostatic Lying   BP- Lying: 107/73  Pulse- Lying: 120       Orthostatic Sitting  BP- Sitting: 111/73  Pulse- Sitting: 128       Orthostatic Standing at 0 minutes  BP- Standing at 0 minutes: 97/78  Pulse- Standing at 0 minutes: 131   Pt mildly orthostatic, receiving IVF's  Quant >2000, urine w/o evidence of infection,  no leukocytosis, chemistries unremarkable and lactic acid reassuring.  Covid POSITIVE  OB US shows single IUP at 6w gestation.    On recheck, pt sitting cross legged on the stretcher upset about wait time. She appears appropriate for d/c home.  Will continue tylenol if needed for pain.  Discussed isolation recommendations and advised her to notify OB of her Covid+ status.  She has OB appt later this week.  Return precautions discussed.   CHAVONNE SFORZA was evaluated in Emergency Department on 03/20/2021 for the symptoms described in the history of present illness. She  was evaluated in the context of the global COVID-19 pandemic, which necessitated consideration that the patient might be at risk for infection with the SARS-CoV-2 virus that causes COVID-19. Institutional protocols and algorithms that pertain to the evaluation of patients at risk for COVID-19 are in a state of rapid change based on information released by regulatory bodies including the CDC and federal and state organizations. These policies and algorithms were followed during the patient's care in the ED.    Final Clinical Impression(s) / ED Diagnoses Final diagnoses:  COVID  Acute bilateral low back pain with bilateral sciatica    Rx / DC Orders ED Discharge Orders     None        Pauline Aus, PA-C 03/20/21 1505    LongArlyss Repress, MD 03/25/21 2211

## 2021-03-18 NOTE — ED Triage Notes (Signed)
States she is 3 months pregnant, has pain in back radiating into legs and feet, had back surgery 3 months ago prior to becoming pregnant

## 2021-03-18 NOTE — Discharge Instructions (Addendum)
Your work-up today shows that you have COVID.  It is important that you isolate at home until you are at least 2 days without symptoms or fever without need for taking Tylenol.  Do not take ibuprofen while pregnant.  You may continue to take Tylenol if needed for your back pain.  Call your OB/GYN office tomorrow to notify them of your ultrasound today and your positive COVID.  Return to the emergency department for any new or worsening symptoms.

## 2021-03-18 NOTE — ED Notes (Addendum)
  CRITICAL VALUE: COVID POSITIVE  DATE & TIME NOTIFIED: 4:53 PM  MD NOTIFIED: TAMMY TRIPPLETT  RESPONSE: none

## 2021-03-22 ENCOUNTER — Other Ambulatory Visit: Payer: Medicaid Other

## 2021-03-25 ENCOUNTER — Other Ambulatory Visit: Payer: Self-pay

## 2021-03-25 ENCOUNTER — Ambulatory Visit: Payer: Medicaid Other

## 2021-04-30 ENCOUNTER — Other Ambulatory Visit: Payer: Self-pay | Admitting: Obstetrics & Gynecology

## 2021-04-30 DIAGNOSIS — Z3682 Encounter for antenatal screening for nuchal translucency: Secondary | ICD-10-CM

## 2021-05-01 ENCOUNTER — Ambulatory Visit: Payer: Medicaid Other | Admitting: *Deleted

## 2021-05-01 ENCOUNTER — Other Ambulatory Visit (HOSPITAL_COMMUNITY)
Admission: RE | Admit: 2021-05-01 | Discharge: 2021-05-01 | Disposition: A | Discharge status: Home / Self Care | Payer: Medicaid Other | Source: Ambulatory Visit | Attending: Advanced Practice Midwife | Admitting: Advanced Practice Midwife

## 2021-05-01 ENCOUNTER — Other Ambulatory Visit: Payer: Self-pay

## 2021-05-01 ENCOUNTER — Encounter: Payer: Self-pay | Admitting: Advanced Practice Midwife

## 2021-05-01 ENCOUNTER — Ambulatory Visit (INDEPENDENT_AMBULATORY_CARE_PROVIDER_SITE_OTHER): Payer: Medicaid Other

## 2021-05-01 ENCOUNTER — Ambulatory Visit (INDEPENDENT_AMBULATORY_CARE_PROVIDER_SITE_OTHER): Payer: Medicaid Other | Admitting: Advanced Practice Midwife

## 2021-05-01 DIAGNOSIS — Z3A12 12 weeks gestation of pregnancy: Secondary | ICD-10-CM

## 2021-05-01 DIAGNOSIS — Z113 Encounter for screening for infections with a predominantly sexual mode of transmission: Secondary | ICD-10-CM | POA: Insufficient documentation

## 2021-05-01 DIAGNOSIS — Z3401 Encounter for supervision of normal first pregnancy, first trimester: Secondary | ICD-10-CM | POA: Diagnosis present

## 2021-05-01 DIAGNOSIS — Z3400 Encounter for supervision of normal first pregnancy, unspecified trimester: Secondary | ICD-10-CM | POA: Insufficient documentation

## 2021-05-01 DIAGNOSIS — Z8639 Personal history of other endocrine, nutritional and metabolic disease: Secondary | ICD-10-CM | POA: Insufficient documentation

## 2021-05-01 DIAGNOSIS — O0990 Supervision of high risk pregnancy, unspecified, unspecified trimester: Secondary | ICD-10-CM | POA: Insufficient documentation

## 2021-05-01 DIAGNOSIS — Z3682 Encounter for antenatal screening for nuchal translucency: Secondary | ICD-10-CM

## 2021-05-01 DIAGNOSIS — E0590 Thyrotoxicosis, unspecified without thyrotoxic crisis or storm: Secondary | ICD-10-CM | POA: Diagnosis not present

## 2021-05-01 DIAGNOSIS — Z124 Encounter for screening for malignant neoplasm of cervix: Secondary | ICD-10-CM

## 2021-05-01 DIAGNOSIS — Z3402 Encounter for supervision of normal first pregnancy, second trimester: Secondary | ICD-10-CM

## 2021-05-01 LAB — POCT URINALYSIS DIPSTICK OB
Blood, UA: NEGATIVE
Glucose, UA: NEGATIVE
Ketones, UA: NEGATIVE
Leukocytes, UA: NEGATIVE
Nitrite, UA: POSITIVE
POC,PROTEIN,UA: NEGATIVE

## 2021-05-01 MED ORDER — BLOOD PRESSURE MONITOR MISC: 0 refills | Status: AC

## 2021-05-01 NOTE — Progress Notes (Signed)
[place] 12+4 wks,measurements c/w dates,anterior placenta gr 0,normal ovaries,fhr 167 bpm,CRL 58.90 mm,NB present,NT 1.3 mm

## 2021-05-01 NOTE — Progress Notes (Signed)
INITIAL OBSTETRICAL VISIT Patient name: Sandy Carter MRN [id number]  Date of birth: 2000-11-21 Chief Complaint:   Initial Prenatal Visit  History of Present Illness:   Sandy Carter is a 20 y.o. G1P0 Caucasian female at [redacted]w[redacted]d by [place] at 6.2 weeks with an Estimated Date of Delivery: 11/09/21 being seen today for her initial obstetrical visit.   Patient's last menstrual period was 01/14/2021 (exact date). Her obstetrical history is significant for primigravida.   Today she reports no complaints. States her anx/depression is stable and may be improved- declines meds or amb BH referral currently; dx with hyperthyroid at age 40 and is on no meds currently; last seizure approx 2 months ago- not epileptic- takes hydroxyzine when she 'can feel it coming on'. Last pap never.   Depression screen [facility] 2/9 05/01/2021 03/13/2021  Decreased Interest 0 1  Down, Depressed, Hopeless 0 2  PHQ - 2 Score 0 3  Altered sleeping 3 3  Tired, decreased energy 3 3  Change in appetite 0 0  Feeling bad or failure about yourself  0 0  Trouble concentrating 3 0  Moving slowly or fidgety/restless 0 0  Suicidal thoughts 0 0  PHQ-9 Score 9 9     GAD 7 : Generalized Anxiety Score 05/01/2021 03/13/2021  Nervous, Anxious, on Edge 3 2  Control/stop worrying 3 3  Worry too much - different things 2 2  Trouble relaxing 3 0  Restless 0 0  Easily annoyed or irritable 1 2  Afraid - awful might happen 1 0  Total GAD 7 Score 13 9     Review of Systems:   Pertinent items are noted in HPI Denies cramping/contractions, leakage of fluid, vaginal bleeding, abnormal vaginal discharge w/ itching/odor/irritation, headaches, visual changes, shortness of breath, chest pain, abdominal pain, severe nausea/vomiting, or problems with urination or bowel movements unless otherwise stated above.  Pertinent History Reviewed:  Reviewed past medical,surgical, social, obstetrical and family history.  Reviewed problem list, medications and  allergies. OB History  Gravida Para Term Preterm AB Living  1            SAB IAB Ectopic Multiple Live Births               # Outcome Date GA Lbr Len/2nd Weight Sex Delivery Anes PTL Lv  1 Current            Physical Assessment:  There were no vitals filed for this visit.There is no height or weight on file to calculate BMI.       Physical Examination:  General appearance - well appearing, and in no distress  Mental status - alert, oriented to person, place, and time  Psych:  She has a normal mood and affect  Skin - warm and dry, normal color, no suspicious lesions noted  Chest - effort normal, all lung fields clear to auscultation bilaterally  Heart - normal rate and regular rhythm  Abdomen - soft, nontender  Extremities:  No swelling or varicosities noted  Pelvic - VULVA: normal appearing vulva with no masses, tenderness or lesions  VAGINA: normal appearing vagina with normal color and discharge, no lesions  CERVIX: normal appearing cervix without discharge or lesions, no CMT  Thin prep pap is done without HR HPV cotesting  Chaperone: Malachy Mood    TODAY'S NT [place] 12+4 wks,measurements c/w dates,anterior placenta gr 0,normal ovaries,fhr 167 bpm,CRL 58.90 mm,NB present,NT 1.3 mm  Results for orders placed or performed in visit on 05/01/21 (  from the past 24 hour(s))  POC Urinalysis Dipstick OB   Collection Time: 05/01/21  3:44 PM  Result Value Ref Range   Color, UA     Clarity, UA     Glucose, UA Negative Negative   Bilirubin, UA     Ketones, UA neg    Spec Grav, UA     Blood, UA neg    pH, UA     POC,PROTEIN,UA Negative Negative, Trace, Small (1+), Moderate (2+), Large (3+), 4+   Urobilinogen, UA     Nitrite, UA positive    Leukocytes, UA Negative Negative   Appearance     Odor      Assessment & Plan:  1) Low-Risk Pregnancy G1P0 at [redacted]w[redacted]d with an Estimated Date of Delivery: 11/09/21   2) Initial OB visit  3) Hyperthyroid, dx at age 24, took meds in the past but  hasn't needed any recently, will get TSH today  4) Anx/dep, no meds currently; feels stable; will notify us if she wants amb [facility] referral  5) Non-epileptic seizures, 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, no neuro notes in Epic other than from a microdiscectomy in April  Meds:  Meds ordered this encounter  Medications   Blood Pressure Monitor MISC    Sig: For regular home bp monitoring during pregnancy    Dispense:  1 each    Refill:  0    Z34.81 Please mail to patient    Initial labs obtained Continue prenatal vitamins Reviewed n/v relief measures and warning s/s to report Reviewed recommended weight gain based on pre-gravid BMI Encouraged well-balanced diet Genetic & carrier screening discussed: requests Panorama and NT/IT, requests Horizon  Ultrasound discussed; fetal survey: requested CCNC completed> form faxed if has or is planning to apply for medicaid The nature of  - [facility] [employer] with multiple MDs and other Advanced Practice Providers was explained to patient; also emphasized that fellows, residents, and students are part of our team. Does not have home bp cuff. Office bp cuff given: no. Rx sent: yes. Check bp weekly, let us know if consistently >140/90.   No indications for ASA therapy or early Hgb A1c (per uptodate)   Follow-up: Return in about 4 weeks (around 05/29/2021) for LROB.   Orders Placed This Encounter  Procedures   Urine Culture   Integrated 1   Genetic Screening   CBC/D/Plt+RPR+Rh+ABO+RubIgG...   TSH   Pain Management Screening Profile (10S)   POC Urinalysis Dipstick OB    [name] [facility] 05/01/2021 4:59 PM

## 2021-05-01 NOTE — Patient Instructions (Signed)
Sandy Carter, thank you for choosing our office today! We appreciate the opportunity to meet your healthcare needs. You may receive a short survey by mail, e-mail, or through [employer]. If you are happy with your care we would appreciate if you could take just a few minutes to complete the survey questions. We read all of your comments and take your feedback very seriously. Thank you again for choosing our office.  [facility] for [employer] [facility] Team at [facility]  [facility] ([address] [place], [place] [address]) Entrance C, located off of [address] Free 24/7 valet parking   Nausea & Vomiting Have saltine crackers or pretzels by your bed and eat a few bites before you raise your head out of bed in the morning Eat small frequent meals throughout the day instead of large meals Drink plenty of fluids throughout the day to stay hydrated, just don't drink a lot of fluids with your meals.  This can make your stomach fill up faster making you feel sick Do not brush your teeth right after you eat Products with real ginger are good for nausea, like ginger ale and ginger hard candy Make sure it says made with real ginger! Sucking on sour candy like lemon heads is also good for nausea If your prenatal vitamins make you nauseated, take them at night so you will sleep through the nausea Sea Bands If you feel like you need medicine for the nausea & vomiting please let us know If you are unable to keep any fluids or food down please let us know   Constipation Drink plenty of fluid, preferably water, throughout the day Eat foods high in fiber such as fruits, vegetables, and grains Exercise, such as walking, is a good way to keep your bowels regular Drink warm fluids, especially warm prune juice, or decaf coffee Eat a 1/2 cup of real oatmeal (not instant), 1/2 cup applesauce, and 1/2-1 cup warm prune juice every day If needed, you may take Colace (docusate sodium) stool softener  once or twice a day to help keep the stool soft.  If you still are having problems with constipation, you may take Miralax once daily as needed to help keep your bowels regular.   Home Blood Pressure Monitoring for Patients   Your provider has recommended that you check your blood pressure (BP) at least once a week at home. If you do not have a blood pressure cuff at home, one will be provided for you. Contact your provider if you have not received your monitor within 1 week.   Helpful Tips for Accurate Home Blood Pressure Checks  Don't smoke, exercise, or drink caffeine 30 minutes before checking your BP Use the restroom before checking your BP (a full bladder can raise your pressure) Relax in a comfortable upright chair Feet on the ground Left arm resting comfortably on a flat surface at the level of your heart Legs uncrossed Back supported Sit quietly and don't talk Place the cuff on your bare arm Adjust snuggly, so that only two fingertips can fit between your skin and the top of the cuff Check 2 readings separated by at least one minute Keep a log of your BP readings For a visual, please reference this diagram: http://ccnc.care/bpdiagram  Provider Name: [facility]     Phone: [phone]  Zone 1: ALL CLEAR  Continue to monitor your symptoms:  BP reading is less than 140 (top number) or less than 90 (bottom  number)  No right upper stomach pain No headaches or seeing spots No feeling nauseated or throwing up No swelling in face and hands  Zone 2: CAUTION Call your doctor's office for any of the following:  BP reading is greater than 140 (top number) or greater than 90 (bottom number)  Stomach pain under your ribs in the middle or right side Headaches or seeing spots Feeling nauseated or throwing up Swelling in face and hands  Zone 3: EMERGENCY  Seek immediate medical care if you have any of the following:  BP reading is greater than160 (top number) or greater than  110 (bottom number) Severe headaches not improving with Tylenol Serious difficulty catching your breath Any worsening symptoms from Zone 2    First Trimester of Pregnancy The first trimester of pregnancy is from week 1 until the end of week 12 (months 1 through 3). A week after a sperm fertilizes an egg, the egg will implant on the wall of the uterus. This embryo will begin to develop into a baby. Genes from you and your partner are forming the baby. The female genes determine whether the baby is a boy or a girl. At 6-8 weeks, the eyes and face are formed, and the heartbeat can be seen on ultrasound. At the end of 12 weeks, all the baby's organs are formed.  Now that you are pregnant, you will want to do everything you can to have a healthy baby. Two of the most important things are to get good prenatal care and to follow your health care provider's instructions. Prenatal care is all the medical care you receive before the baby's birth. This care will help prevent, find, and treat any problems during the pregnancy and childbirth. BODY CHANGES Your body goes through many changes during pregnancy. The changes vary from woman to woman.  You may gain or lose a couple of pounds at first. You may feel sick to your stomach (nauseous) and throw up (vomit). If the vomiting is uncontrollable, call your health care provider. You may tire easily. You may develop headaches that can be relieved by medicines approved by your health care provider. You may urinate more often. Painful urination may mean you have a bladder infection. You may develop heartburn as a result of your pregnancy. You may develop constipation because certain hormones are causing the muscles that push waste through your intestines to slow down. You may develop hemorrhoids or swollen, bulging veins (varicose veins). Your breasts may begin to grow larger and become tender. Your nipples may stick out more, and the tissue that surrounds them  (areola) may become darker. Your gums may bleed and may be sensitive to brushing and flossing. Dark spots or blotches (chloasma, mask of pregnancy) may develop on your face. This will likely fade after the baby is born. Your menstrual periods will stop. You may have a loss of appetite. You may develop cravings for certain kinds of food. You may have changes in your emotions from day to day, such as being excited to be pregnant or being concerned that something may go wrong with the pregnancy and baby. You may have more vivid and strange dreams. You may have changes in your hair. These can include thickening of your hair, rapid growth, and changes in texture. Some women also have hair loss during or after pregnancy, or hair that feels dry or thin. Your hair will most likely return to normal after your baby is born. WHAT TO EXPECT AT YOUR PRENATAL  VISITS During a routine prenatal visit: You will be weighed to make sure you and the baby are growing normally. Your blood pressure will be taken. Your abdomen will be measured to track your baby's growth. The fetal heartbeat will be listened to starting around week 10 or 12 of your pregnancy. Test results from any previous visits will be discussed. Your health care provider may ask you: How you are feeling. If you are feeling the baby move. If you have had any abnormal symptoms, such as leaking fluid, bleeding, severe headaches, or abdominal cramping. If you have any questions. Other tests that may be performed during your first trimester include: Blood tests to find your blood type and to check for the presence of any previous infections. They will also be used to check for low iron levels (anemia) and Rh antibodies. Later in the pregnancy, blood tests for diabetes will be done along with other tests if problems develop. Urine tests to check for infections, diabetes, or protein in the urine. An ultrasound to confirm the proper growth and development  of the baby. An amniocentesis to check for possible genetic problems. Fetal screens for spina bifida and Down syndrome. You may need other tests to make sure you and the baby are doing well. HOME CARE INSTRUCTIONS  Medicines Follow your health care provider's instructions regarding medicine use. Specific medicines may be either safe or unsafe to take during pregnancy. Take your prenatal vitamins as directed. If you develop constipation, try taking a stool softener if your health care provider approves. Diet Eat regular, well-balanced meals. Choose a variety of foods, such as meat or vegetable-based protein, fish, milk and low-fat dairy products, vegetables, fruits, and whole grain breads and cereals. Your health care provider will help you determine the amount of weight gain that is right for you. Avoid raw meat and uncooked cheese. These carry germs that can cause birth defects in the baby. Eating four or five small meals rather than three large meals a day may help relieve nausea and vomiting. If you start to feel nauseous, eating a few soda crackers can be helpful. Drinking liquids between meals instead of during meals also seems to help nausea and vomiting. If you develop constipation, eat more high-fiber foods, such as fresh vegetables or fruit and whole grains. Drink enough fluids to keep your urine clear or pale yellow. Activity and Exercise Exercise only as directed by your health care provider. Exercising will help you: Control your weight. Stay in shape. Be prepared for labor and delivery. Experiencing pain or cramping in the lower abdomen or low back is a good sign that you should stop exercising. Check with your health care provider before continuing normal exercises. Try to avoid standing for long periods of time. Move your legs often if you must stand in one place for a long time. Avoid heavy lifting. Wear low-heeled shoes, and practice good posture. You may continue to have sex  unless your health care provider directs you otherwise. Relief of Pain or Discomfort Wear a good support bra for breast tenderness.   Take warm sitz baths to soothe any pain or discomfort caused by hemorrhoids. Use hemorrhoid cream if your health care provider approves.   Rest with your legs elevated if you have leg cramps or low back pain. If you develop varicose veins in your legs, wear support hose. Elevate your feet for 15 minutes, 3-4 times a day. Limit salt in your diet. Prenatal Care Schedule your prenatal visits by the  twelfth week of pregnancy. They are usually scheduled monthly at first, then more often in the last 2 months before delivery. Write down your questions. Take them to your prenatal visits. Keep all your prenatal visits as directed by your health care provider. Safety Wear your seat belt at all times when driving. Make a list of emergency phone numbers, including numbers for family, friends, the hospital, and police and fire departments. General Tips Ask your health care provider for a referral to a local prenatal education class. Begin classes no later than at the beginning of month 6 of your pregnancy. Ask for help if you have counseling or nutritional needs during pregnancy. Your health care provider can offer advice or refer you to specialists for help with various needs. Do not use hot tubs, steam rooms, or saunas. Do not douche or use tampons or scented sanitary pads. Do not cross your legs for long periods of time. Avoid cat litter boxes and soil used by cats. These carry germs that can cause birth defects in the baby and possibly loss of the fetus by miscarriage or stillbirth. Avoid all smoking, herbs, alcohol, and medicines not prescribed by your health care provider. Chemicals in these affect the formation and growth of the baby. Schedule a dentist appointment. At home, brush your teeth with a soft toothbrush and be gentle when you floss. SEEK MEDICAL CARE IF:   You have dizziness. You have mild pelvic cramps, pelvic pressure, or nagging pain in the abdominal area. You have persistent nausea, vomiting, or diarrhea. You have a bad smelling vaginal discharge. You have pain with urination. You notice increased swelling in your face, hands, legs, or ankles. SEEK IMMEDIATE MEDICAL CARE IF:  You have a fever. You are leaking fluid from your vagina. You have spotting or bleeding from your vagina. You have severe abdominal cramping or pain. You have rapid weight gain or loss. You vomit blood or material that looks like coffee grounds. You are exposed to [place] measles and have never had them. You are exposed to fifth disease or chickenpox. You develop a severe headache. You have shortness of breath. You have any kind of trauma, such as from a fall or a car accident. Document Released: 08/19/2001 Document Revised: 01/09/2014 Document Reviewed: 07/05/2013 [facility] Patient Information 2015 [place], [place]. This information is not intended to replace advice given to you by your health care provider. Make sure you discuss any questions you have with your health care provider.

## 2021-05-02 LAB — PMP SCREEN PROFILE (10S), URINE

## 2021-05-03 LAB — CYTOLOGY - PAP
Chlamydia: NEGATIVE
Comment: NEGATIVE
Comment: NORMAL
Diagnosis: NEGATIVE
Neisseria Gonorrhea: NEGATIVE

## 2021-05-04 LAB — URINE CULTURE

## 2021-05-05 LAB — CBC/D/PLT+RPR+RH+ABO+RUBIGG...
Antibody Screen: NEGATIVE
Basophils Absolute: 0 10*3/uL (ref 0.0–0.2)
Basos: 0 %
EOS (ABSOLUTE): 0.1 10*3/uL (ref 0.0–0.4)
Eos: 1 %
HCV Ab: <0.1 s/co ratio (ref 0.0–0.9)
HIV Screen 4th Generation wRfx: NONREACTIVE
Hematocrit: 32.7 % — ABNORMAL LOW (ref 34.0–46.6)
Hemoglobin: 11.1 g/dL (ref 11.1–15.9)
Hepatitis B Surface Ag: NEGATIVE
Immature Grans (Abs): 0 10*3/uL (ref 0.0–0.1)
Immature Granulocytes: 0 %
Lymphocytes Absolute: 2.4 10*3/uL (ref 0.7–3.1)
Lymphs: 28 %
MCH: 30.9 pg (ref 26.6–33.0)
MCHC: 33.9 g/dL (ref 31.5–35.7)
MCV: 91 fL (ref 79–97)
Monocytes Absolute: 0.5 10*3/uL (ref 0.1–0.9)
Monocytes: 6 %
Neutrophils Absolute: 5.5 10*3/uL (ref 1.4–7.0)
Neutrophils: 65 %
Platelets: 300 10*3/uL (ref 150–450)
RBC: 3.59 x10E6/uL — ABNORMAL LOW (ref 3.77–5.28)
RDW: 13.2 % (ref 11.7–15.4)
RPR Ser Ql: NONREACTIVE
Rh Factor: POSITIVE
Rubella Antibodies, IGG: 8.07 index (ref >0.99)
WBC: 8.4 10*3/uL (ref 3.4–10.8)

## 2021-05-05 LAB — PMP SCREEN PROFILE (10S), URINE
Amphetamine Scrn, Ur: NEGATIVE ng/mL
BARBITURATE SCREEN URINE: NEGATIVE ng/mL
BENZODIAZEPINE SCREEN, URINE: NEGATIVE ng/mL
CANNABINOIDS UR QL SCN: POSITIVE ng/mL — AB
Cocaine (Metab) Scrn, Ur: NEGATIVE ng/mL
Creatinine(Crt), U: 93.9 mg/dL (ref 20.0–300.0)
Methadone Screen, Urine: NEGATIVE ng/mL
OXYCODONE+OXYMORPHONE UR QL SCN: NEGATIVE ng/mL
Opiate Scrn, Ur: NEGATIVE ng/mL
Ph of Urine: 7.5 (ref 4.5–8.9)
Phencyclidine Qn, Ur: NEGATIVE ng/mL
Propoxyphene Scrn, Ur: NEGATIVE ng/mL

## 2021-05-05 LAB — INTEGRATED 1
Crown Rump Length: 58.9 mm
Gest. Age on Collection Date: 12.3 weeks
Maternal Age at EDD: 21.8 yr
Nuchal Translucency (NT): 1.3 mm
Number of Fetuses: 1
PAPP-A Value: 1117.3 ng/mL
Weight: 102 [lb_av]

## 2021-05-05 LAB — SPECIMEN STATUS REPORT

## 2021-05-05 LAB — HCV INTERPRETATION

## 2021-05-05 LAB — TSH: TSH: 1.17 u[IU]/mL (ref 0.450–4.500)

## 2021-05-06 ENCOUNTER — Other Ambulatory Visit: Payer: Self-pay | Admitting: Advanced Practice Midwife

## 2021-05-06 DIAGNOSIS — F1290 Cannabis use, unspecified, uncomplicated: Secondary | ICD-10-CM | POA: Insufficient documentation

## 2021-05-06 DIAGNOSIS — R8271 Bacteriuria: Secondary | ICD-10-CM | POA: Insufficient documentation

## 2021-05-06 DIAGNOSIS — O99891 Other specified diseases and conditions complicating pregnancy: Secondary | ICD-10-CM | POA: Insufficient documentation

## 2021-05-06 DIAGNOSIS — E0590 Thyrotoxicosis, unspecified without thyrotoxic crisis or storm: Secondary | ICD-10-CM

## 2021-05-06 DIAGNOSIS — Z3401 Encounter for supervision of normal first pregnancy, first trimester: Secondary | ICD-10-CM

## 2021-05-06 MED ORDER — NITROFURANTOIN MONOHYD MACRO 100 MG PO CAPS: 100.0000 mg | ORAL_CAPSULE | Freq: Two times a day (BID) | ORAL | 0 refills | Status: DC

## 2021-05-14 ENCOUNTER — Telehealth: Payer: Self-pay | Admitting: Advanced Practice Midwife

## 2021-05-14 NOTE — Telephone Encounter (Signed)
Patient called about lab results.Also said code isn't working for the panorama.

## 2021-05-15 NOTE — Telephone Encounter (Signed)
Advised patient to contact Sandy Carter customer service for assistance with changing her name to the correct spelling in the [employer] portal.

## 2021-05-16 ENCOUNTER — Encounter: Payer: Self-pay | Admitting: Advanced Practice Midwife

## 2021-05-28 ENCOUNTER — Other Ambulatory Visit: Payer: Self-pay

## 2021-05-28 ENCOUNTER — Encounter: Payer: Self-pay | Admitting: Women's Health

## 2021-05-28 ENCOUNTER — Ambulatory Visit (INDEPENDENT_AMBULATORY_CARE_PROVIDER_SITE_OTHER): Payer: Medicaid Other | Admitting: Women's Health

## 2021-05-28 VITALS — BP 106/67 | HR 63 | Wt 107.0 lb

## 2021-05-28 DIAGNOSIS — Z23 Encounter for immunization: Secondary | ICD-10-CM | POA: Diagnosis not present

## 2021-05-28 DIAGNOSIS — Z1379 Encounter for other screening for genetic and chromosomal anomalies: Secondary | ICD-10-CM

## 2021-05-28 DIAGNOSIS — Z3A16 16 weeks gestation of pregnancy: Secondary | ICD-10-CM

## 2021-05-28 DIAGNOSIS — Z3402 Encounter for supervision of normal first pregnancy, second trimester: Secondary | ICD-10-CM

## 2021-05-28 DIAGNOSIS — Z363 Encounter for antenatal screening for malformations: Secondary | ICD-10-CM

## 2021-05-28 MED ORDER — PROAIR HFA 108 (90 BASE) MCG/ACT IN AERS: 1.0000 | INHALATION_SPRAY | Freq: Four times a day (QID) | RESPIRATORY_TRACT | 1 refills | Status: DC | PRN

## 2021-05-28 NOTE — Addendum Note (Signed)
Addended by: Federico Flake A on: 05/28/2021 02:54 PM   Modules accepted: Orders

## 2021-05-28 NOTE — Patient Instructions (Signed)
Sandy Carter, thank you for choosing our office today! We appreciate the opportunity to meet your healthcare needs. You may receive a short survey by mail, e-mail, or through MyChart. If you are happy with your care we would appreciate if you could take just a few minutes to complete the survey questions. We read all of your comments and take your feedback very seriously. Thank you again for choosing our office.  [facility] Team at [facility] ([address] [place], [place] [address]) Entrance C, located off of [address] Free 24/7 valet parking  Go to [email or web address] to register for FREE online childbirth classes  Call the office ([phone]) or go to [facility] if: You begin to severe cramping Your water breaks.  Sometimes it is a big gush of fluid, sometimes it is just a trickle that keeps getting your panties wet or running down your legs You have vaginal bleeding.  It is normal to have a small amount of spotting if your cervix was checked.   [place] [facility] Doctors [facility] ([facility]): [address], [phone]           [facility]: [address], [phone]                [facility] ([facility]): [address], [phone] (call to ask if accepting patients) [facility]: [address], [place], [phone]    [place] [facility]/Family Doctors [facility] ([facility]): [address], [phone] [facility]: [address], [phone] [facility]: [address], [phone]  [place] [facility]  [facility] ([facility]): [phone] [facility]: [address], [phone]   [place] [facility]: [address], [phone]  [place] [facility]  [facility]: [address], [phone]  Home Blood Pressure Monitoring for Patients   Your provider has recommended that you check your blood pressure (BP) at least once a week at home. If you do not have a blood pressure cuff at home, one will be provided for you. Contact your provider if you have not received your monitor within 1 week.   Helpful Tips for Accurate Home Blood Pressure Checks  Don't smoke, exercise, or drink caffeine 30 minutes before checking your BP Use the restroom before checking your BP (a full bladder can raise your pressure) Relax in a comfortable upright chair Feet on the ground Left arm resting comfortably on a flat surface at the level of your heart Legs uncrossed Back supported Sit quietly and don't talk Place the cuff on your bare arm Adjust snuggly, so that only two fingertips can fit between your skin and the top of the cuff Check 2 readings separated by at least one minute Keep a log of your BP readings For a visual, please reference this diagram: http://ccnc.care/bpdiagram  Provider Name: [facility]     Phone: [phone]  Zone 1: ALL CLEAR  Continue to monitor your symptoms:  BP reading is less than 140 (top number) or less than 90 (bottom number)  No right upper stomach pain No headaches or seeing spots No feeling nauseated or throwing up No swelling in face and hands  Zone 2: CAUTION Call your doctor's office for any of the following:  BP reading is greater than 140 (top number) or greater than   90 (bottom number)  Stomach pain under your ribs in the middle or right side Headaches or seeing spots Feeling nauseated or throwing up Swelling in face and hands  Zone 3: EMERGENCY  Seek immediate medical care if you have any of the following:  BP reading is greater than160 (top number) or greater than 110 (bottom number) Severe headaches not improving with Tylenol Serious difficulty catching your breath Any worsening symptoms from  Zone 2     Second Trimester of Pregnancy The second trimester is from week 14 through week 27 (months 4 through 6). The second trimester is often a time when you feel your best. Your body has adjusted to being pregnant, and you begin to feel better physically. Usually, morning sickness has lessened or quit completely, you may have more energy, and you may have an increase in appetite. The second trimester is also a time when the fetus is growing rapidly. At the end of the sixth month, the fetus is about 9 inches long and weighs about 1 pounds. You will likely begin to feel the baby move (quickening) between 16 and 20 weeks of pregnancy. Body changes during your second trimester Your body continues to go through many changes during your second trimester. The changes vary from woman to woman. Your weight will continue to increase. You will notice your lower abdomen bulging out. You may begin to get stretch marks on your hips, abdomen, and breasts. You may develop headaches that can be relieved by medicines. The medicines should be approved by your health care provider. You may urinate more often because the fetus is pressing on your bladder. You may develop or continue to have heartburn as a result of your pregnancy. You may develop constipation because certain hormones are causing the muscles that push waste through your intestines to slow down. You may develop hemorrhoids or swollen, bulging veins (varicose veins). You may have back pain. This is caused by: Weight gain. Pregnancy hormones that are relaxing the joints in your pelvis. A shift in weight and the muscles that support your balance. Your breasts will continue to grow and they will continue to become tender. Your gums may bleed and may be sensitive to brushing and flossing. Dark spots or blotches (chloasma, mask of pregnancy) may develop on your face. This will likely fade after the baby is born. A dark line from your belly button to  the pubic area (linea nigra) may appear. This will likely fade after the baby is born. You may have changes in your hair. These can include thickening of your hair, rapid growth, and changes in texture. Some women also have hair loss during or after pregnancy, or hair that feels dry or thin. Your hair will most likely return to normal after your baby is born.  What to expect at prenatal visits During a routine prenatal visit: You will be weighed to make sure you and the fetus are growing normally. Your blood pressure will be taken. Your abdomen will be measured to track your baby's growth. The fetal heartbeat will be listened to. Any test results from the previous visit will be discussed.  Your health care provider may ask you: How you are feeling. If you are feeling the baby move. If you have had any abnormal symptoms, such as leaking fluid, bleeding, severe headaches, or abdominal cramping. If you are using any tobacco products, including cigarettes, chewing tobacco, and electronic cigarettes. If you have any questions.  Other tests that may be performed during   your second trimester include: Blood tests that check for: Low iron levels (anemia). High blood sugar that affects pregnant women (gestational diabetes) between 24 and 28 weeks. Rh antibodies. This is to check for a protein on red blood cells (Rh factor). Urine tests to check for infections, diabetes, or protein in the urine. An ultrasound to confirm the proper growth and development of the baby. An amniocentesis to check for possible genetic problems. Fetal screens for spina bifida and Down syndrome. HIV (human immunodeficiency virus) testing. Routine prenatal testing includes screening for HIV, unless you choose not to have this test.  Follow these instructions at home: Medicines Follow your health care provider's instructions regarding medicine use. Specific medicines may be either safe or unsafe to take during  pregnancy. Take a prenatal vitamin that contains at least 600 micrograms (mcg) of folic acid. If you develop constipation, try taking a stool softener if your health care provider approves. Eating and drinking Eat a balanced diet that includes fresh fruits and vegetables, whole grains, good sources of protein such as meat, eggs, or tofu, and low-fat dairy. Your health care provider will help you determine the amount of weight gain that is right for you. Avoid raw meat and uncooked cheese. These carry germs that can cause birth defects in the baby. If you have low calcium intake from food, talk to your health care provider about whether you should take a daily calcium supplement. Limit foods that are high in fat and processed sugars, such as fried and sweet foods. To prevent constipation: Drink enough fluid to keep your urine clear or pale yellow. Eat foods that are high in fiber, such as fresh fruits and vegetables, whole grains, and beans. Activity Exercise only as directed by your health care provider. Most women can continue their usual exercise routine during pregnancy. Try to exercise for 30 minutes at least 5 days a week. Stop exercising if you experience uterine contractions. Avoid heavy lifting, wear low heel shoes, and practice good posture. A sexual relationship may be continued unless your health care provider directs you otherwise. Relieving pain and discomfort Wear a good support bra to prevent discomfort from breast tenderness. Take warm sitz baths to soothe any pain or discomfort caused by hemorrhoids. Use hemorrhoid cream if your health care provider approves. Rest with your legs elevated if you have leg cramps or low back pain. If you develop varicose veins, wear support hose. Elevate your feet for 15 minutes, 3-4 times a day. Limit salt in your diet. Prenatal Care Write down your questions. Take them to your prenatal visits. Keep all your prenatal visits as told by your health  care provider. This is important. Safety Wear your seat belt at all times when driving. Make a list of emergency phone numbers, including numbers for family, friends, the hospital, and police and fire departments. General instructions Ask your health care provider for a referral to a local prenatal education class. Begin classes no later than the beginning of month 6 of your pregnancy. Ask for help if you have counseling or nutritional needs during pregnancy. Your health care provider can offer advice or refer you to specialists for help with various needs. Do not use hot tubs, steam rooms, or saunas. Do not douche or use tampons or scented sanitary pads. Do not cross your legs for long periods of time. Avoid cat litter boxes and soil used by cats. These carry germs that can cause birth defects in the baby and possibly loss of the   fetus by miscarriage or stillbirth. Avoid all smoking, herbs, alcohol, and unprescribed drugs. Chemicals in these products can affect the formation and growth of the baby. Do not use any products that contain nicotine or tobacco, such as cigarettes and e-cigarettes. If you need help quitting, ask your health care provider. Visit your dentist if you have not gone yet during your pregnancy. Use a soft toothbrush to brush your teeth and be gentle when you floss. Contact a health care provider if: You have dizziness. You have mild pelvic cramps, pelvic pressure, or nagging pain in the abdominal area. You have persistent nausea, vomiting, or diarrhea. You have a bad smelling vaginal discharge. You have pain when you urinate. Get help right away if: You have a fever. You are leaking fluid from your vagina. You have spotting or bleeding from your vagina. You have severe abdominal cramping or pain. You have rapid weight gain or weight loss. You have shortness of breath with chest pain. You notice sudden or extreme swelling of your face, hands, ankles, feet, or legs. You  have not felt your baby move in over an hour. You have severe headaches that do not go away when you take medicine. You have vision changes. Summary The second trimester is from week 14 through week 27 (months 4 through 6). It is also a time when the fetus is growing rapidly. Your body goes through many changes during pregnancy. The changes vary from woman to woman. Avoid all smoking, herbs, alcohol, and unprescribed drugs. These chemicals affect the formation and growth your baby. Do not use any tobacco products, such as cigarettes, chewing tobacco, and e-cigarettes. If you need help quitting, ask your health care provider. Contact your health care provider if you have any questions. Keep all prenatal visits as told by your health care provider. This is important. This information is not intended to replace advice given to you by your health care provider. Make sure you discuss any questions you have with your health care provider. Document Released: 08/19/2001 Document Revised: 01/31/2016 Document Reviewed: 10/26/2012 Elsevier Interactive Patient Education  2017 [employer]  

## 2021-05-28 NOTE — Progress Notes (Signed)
LOW-RISK PREGNANCY VISIT Patient name: Sandy Carter MRN [id number]  Date of birth: 2001-08-25 Chief Complaint:   Routine Prenatal Visit (2nd IT/ urine culture)  History of Present Illness:   Sandy Carter is a 20 y.o. G1P0 female at [redacted]w[redacted]d with an Estimated Date of Delivery: 11/09/21 being seen today for ongoing management of a low-risk pregnancy.   Today she reports  occ sharp pelvic pain . Still taking macrobid for ASB. Needs refill on inhaler.  Contractions: Not present.  .  Movement: Present. denies leaking of fluid.  Depression screen [facility] 2/9 05/01/2021 03/13/2021  Decreased Interest 0 1  Down, Depressed, Hopeless 0 2  PHQ - 2 Score 0 3  Altered sleeping 3 3  Tired, decreased energy 3 3  Change in appetite 0 0  Feeling bad or failure about yourself  0 0  Trouble concentrating 3 0  Moving slowly or fidgety/restless 0 0  Suicidal thoughts 0 0  PHQ-9 Score 9 9     GAD 7 : Generalized Anxiety Score 05/01/2021 03/13/2021  Nervous, Anxious, on Edge 3 2  Control/stop worrying 3 3  Worry too much - different things 2 2  Trouble relaxing 3 0  Restless 0 0  Easily annoyed or irritable 1 2  Afraid - awful might happen 1 0  Total GAD 7 Score 13 9      Review of Systems:   Pertinent items are noted in HPI Denies abnormal vaginal discharge w/ itching/odor/irritation, headaches, visual changes, shortness of breath, chest pain, abdominal pain, severe nausea/vomiting, or problems with urination or bowel movements unless otherwise stated above. Pertinent History Reviewed:  Reviewed past medical,surgical, social, obstetrical and family history.  Reviewed problem list, medications and allergies. Physical Assessment:   Vitals:   05/28/21 1347  BP: 106/67  Pulse: 63  Weight: 107 lb (48.5 kg)  Body mass index is 19.57 kg/m.        Physical Examination:   General appearance: Well appearing, and in no distress  Mental status: Alert, oriented to person, place, and time  Skin: Warm  & dry  Cardiovascular: Normal heart rate noted  Respiratory: Normal respiratory effort, no distress  Abdomen: Soft, gravid, nontender  Pelvic: Cervical exam deferred         Extremities: Edema: None  Fetal Status: Fetal Heart Rate (bpm): 156   Movement: Present    Chaperone: N/A   No results found for this or any previous visit (from the past 24 hour(s)).  Assessment & Plan:  1) Low-risk pregnancy G1P0 at [redacted]w[redacted]d with an Estimated Date of Delivery: 11/09/21   2) Hyperthyroidism, no meds, TSH last visit normal  3) ASB> still taking macrobid, plan urine cx next visit   Meds:  Meds ordered this encounter  Medications   PROAIR HFA 108 (90 Base) MCG/ACT inhaler    Sig: Inhale 1 puff into the lungs every 6 (six) hours as needed for wheezing or shortness of breath.    Dispense:  1 each    Refill:  1    Order Specific Question:   Supervising Provider    Answer:   Lazaro Arms [[id number]]    Labs/procedures today: flu shot and 2nd IT  Plan:  Continue routine obstetrical care  Next visit: prefers will be in person for u/s     Reviewed: Preterm labor symptoms and general obstetric precautions including but not limited to vaginal bleeding, contractions, leaking of fluid and fetal movement were reviewed in detail with the  patient.  All questions were answered.   Follow-up: Return in about 3 weeks (around 06/18/2021) for LROB, AL:[id number], CNM, in person.  No future appointments.  Orders Placed This Encounter  Procedures   US OB Comp + 14 Wk   INTEGRATED 2   Cheral Marker CNM, [facility] 05/28/2021 2:29 PM

## 2021-05-30 ENCOUNTER — Encounter: Payer: Self-pay | Admitting: Advanced Practice Midwife

## 2021-06-03 LAB — INTEGRATED 2
AFP MoM: 1.27
Alpha-Fetoprotein: 53.2 ng/mL
Crown Rump Length: 58.9 mm
DIA MoM: 1
DIA Value: 208.2 pg/mL
Estriol, Unconjugated: 1.23 ng/mL
Gest. Age on Collection Date: 12.3 weeks
Gestational Age: 16.1 weeks
Maternal Age at EDD: 21.8 yr
Nuchal Translucency (NT): 1.3 mm
Nuchal Translucency MoM: 0.99
Number of Fetuses: 1
PAPP-A MoM: 0.81
PAPP-A Value: 1117.3 ng/mL
Test Results:: NEGATIVE
Weight: 100 [lb_av]
Weight: 102 [lb_av]
hCG MoM: 1.26
hCG Value: 57.8 IU/mL
uE3 MoM: 1.15

## 2021-06-12 ENCOUNTER — Encounter: Payer: Self-pay | Admitting: Advanced Practice Midwife

## 2021-06-24 ENCOUNTER — Emergency Department (HOSPITAL_COMMUNITY)
Admission: EM | Admit: 2021-06-24 | Discharge: 2021-06-24 | Disposition: A | Discharge status: Home / Self Care | Payer: Medicaid Other | Attending: Emergency Medicine | Admitting: Emergency Medicine

## 2021-06-24 ENCOUNTER — Ambulatory Visit
Admission: EM | Admit: 2021-06-24 | Discharge: 2021-06-24 | Disposition: A | Discharge status: Home / Self Care | Payer: Medicaid Other

## 2021-06-24 ENCOUNTER — Other Ambulatory Visit: Payer: Self-pay

## 2021-06-24 ENCOUNTER — Encounter (HOSPITAL_COMMUNITY): Payer: Self-pay | Admitting: *Deleted

## 2021-06-24 DIAGNOSIS — K529 Noninfective gastroenteritis and colitis, unspecified: Secondary | ICD-10-CM | POA: Diagnosis not present

## 2021-06-24 DIAGNOSIS — R112 Nausea with vomiting, unspecified: Secondary | ICD-10-CM

## 2021-06-24 LAB — COMPREHENSIVE METABOLIC PANEL
ALT: 12 U/L (ref 0–44)
AST: 17 U/L (ref 15–41)
Albumin: 3.7 g/dL (ref 3.5–5.0)
Alkaline Phosphatase: 47 U/L (ref 38–126)
Anion gap: 5 (ref 5–15)
BUN: 11 mg/dL (ref 6–20)
CO2: 24 mmol/L (ref 22–32)
Calcium: 9 mg/dL (ref 8.9–10.3)
Chloride: 104 mmol/L (ref 98–111)
Creatinine, Ser: 0.47 mg/dL (ref 0.44–1.00)
GFR, Estimated: >60 mL/min (ref >60)
Glucose, Bld: 102 mg/dL — ABNORMAL HIGH (ref 70–99)
Potassium: 3.5 mmol/L (ref 3.5–5.1)
Sodium: 133 mmol/L — ABNORMAL LOW (ref 135–145)
Total Bilirubin: 0.6 mg/dL (ref 0.3–1.2)
Total Protein: 7.2 g/dL (ref 6.5–8.1)

## 2021-06-24 LAB — CBC WITH DIFFERENTIAL/PLATELET
Abs Immature Granulocytes: 0.05 10*3/uL (ref 0.00–0.07)
Basophils Absolute: 0 10*3/uL (ref 0.0–0.1)
Basophils Relative: 0 %
Eosinophils Absolute: 0.1 10*3/uL (ref 0.0–0.5)
Eosinophils Relative: 1 %
HCT: 33 % — ABNORMAL LOW (ref 36.0–46.0)
Hemoglobin: 11.5 g/dL — ABNORMAL LOW (ref 12.0–15.0)
Immature Granulocytes: 1 %
Lymphocytes Relative: 28 %
Lymphs Abs: 2.9 10*3/uL (ref 0.7–4.0)
MCH: 32.8 pg (ref 26.0–34.0)
MCHC: 34.8 g/dL (ref 30.0–36.0)
MCV: 94 fL (ref 80.0–100.0)
Monocytes Absolute: 0.6 10*3/uL (ref 0.1–1.0)
Monocytes Relative: 6 %
Neutro Abs: 6.8 10*3/uL (ref 1.7–7.7)
Neutrophils Relative %: 64 %
Platelets: 272 10*3/uL (ref 150–400)
RBC: 3.51 MIL/uL — ABNORMAL LOW (ref 3.87–5.11)
RDW: 12.5 % (ref 11.5–15.5)
WBC: 10.3 10*3/uL (ref 4.0–10.5)
nRBC: 0 % (ref 0.0–0.2)

## 2021-06-24 LAB — LIPASE, BLOOD: Lipase: 29 U/L (ref 11–51)

## 2021-06-24 LAB — TSH: TSH: 2.255 u[IU]/mL (ref 0.350–4.500)

## 2021-06-24 NOTE — ED Triage Notes (Signed)
States she ate some meat yesterday and suspect it made her sick, has multiple complaints today

## 2021-06-24 NOTE — Discharge Instructions (Addendum)
You are seen in the ER for nausea, vomiting.  Your blood work-up is reassuring. TSH is also within normal limits.  At this time, we recommend that you take the promethazine that you have as needed for severe nausea.  Ensure that you hydrate well.

## 2021-06-24 NOTE — ED Notes (Signed)
Went to call pt back to room, found her outside eating [employer] with boyfriend in circle. Nurse notified.

## 2021-06-24 NOTE — ED Provider Notes (Signed)
[facility] EMERGENCY DEPARTMENT Provider Note   CSN: [id number] Arrival date & time: 06/24/21  1703     History No chief complaint on file.   Sandy DERDEN is a 20 y.o. female.  HPI    20 year old female with history of ADD, seizures who is G1 P0 and about [redacted] weeks pregnant comes in with chief complaint of nausea, vomiting.  Patient reports that she had gone to her bosses barbecue yesterday.  Had there, they had served steak several 31-year-old.  When she was eating them, they did taste a little funny.  Today she woke up with abdominal cramping, nausea and vomiting.  She had emesis x3 prior to ED arrival.  She was dry heaving as well.  Last emesis was 2:30 PM.  No blood, but there was some yellow-green stuff that came out.  She denies any diarrhea.  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  She is currently not on any thyroid medications.  She denies any pelvic pain, vaginal bleeding or discharge that is new.  Patient reports that her heart rate is always fast and she always has some shortness of breath, but here today she feels that her shortness of breath is little bit worse than usual.  She is taking breathing treatment.  She had COVID-19 3 months back.    Past Medical History:  Diagnosis Date   ADD (attention deficit disorder)     in her teens   Anxiety    Depression    Dysrhythmia    palpitations - from Hyperthyroidism   Headache    Hyperthyroidism    not treated at this time   Lumbar herniated disc    ODD (oppositional defiant disorder)    in her teens   Seizures (HCC)    shakes alot, is aware of what is going on, feels them coming on, has not seen a neurologist   Tuberculosis    before age 27    Patient Active Problem List   Diagnosis Date Noted   Asymptomatic bacteriuria during pregnancy 05/06/2021   Marijuana use 05/06/2021   Supervision of normal first pregnancy  05/01/2021   Hyperthyroidism 05/01/2021   HNP (herniated nucleus pulposus), lumbar 12/28/2020   Rectal bleeding 08/13/2020   MDD (major depressive disorder), recurrent severe, 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   Disruptive mood dysregulation disorder (HCC) 06/10/2014   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   ODD (oppositional defiant disorder) 06/10/2014    Past Surgical History:  Procedure Laterality Date   COLONOSCOPY WITH PROPOFOL N/A 09/17/2020   Procedure: COLONOSCOPY WITH PROPOFOL;  Surgeon: Lanelle Bal, DO;  Location: [facility] ENDO SUITE;  Service: Endoscopy;  Laterality: N/A;  2:00pm   LUMBAR LAMINECTOMY/DECOMPRESSION MICRODISCECTOMY Right 12/28/2020   Procedure: Right Lumbar Five Sacral One Microdiscectomy;  Surgeon: Coletta Memos, MD;  Location: [facility] OR;  Service: Neurosurgery;  Laterality: Right;   WISDOM TOOTH EXTRACTION       OB History     Gravida  1   Para      Term      Preterm      AB      Living         SAB      IAB      Ectopic      Multiple      Live Births  Family History  Adopted: Yes  Problem Relation Age of Onset   Alcoholism Mother    Bone cancer Mother    Diabetes Maternal Grandmother     Social History   Tobacco Use   Smoking status: Never   Smokeless tobacco: Never  Vaping Use   Vaping Use: Former  Substance Use Topics   Alcohol use: Not Currently   Drug use: Not Currently    Types: Marijuana    Comment: trying to quit     Home Medications Prior to Admission medications   Medication Sig Start Date End Date Taking? Authorizing Provider  Prenatal Vit-Fe Fumarate-FA (PRENATAL VITAMIN PO) Take by mouth.   Yes [provider]  PROAIR HFA 108 (90 Base) MCG/ACT inhaler Inhale 1 puff into the lungs every 6 (six) hours as needed for wheezing or shortness of breath. 05/28/21  Yes Cheral Marker, CNM  Blood Pressure Monitor MISC For regular home bp monitoring during pregnancy  05/01/21   Cresenzo-Dishmon, Scarlette Calico, CNM  diclofenac Sodium (VOLTAREN) 1 % GEL Apply 1 application topically 2 (two) times daily as needed (swelling/ joint an muscle pain). Patient not taking: No sig reported 09/26/20   [provider]  hydrOXYzine (ATARAX/VISTARIL) 50 MG tablet Take 50 mg by mouth every 6 (six) hours as needed for anxiety or itching (Seizures). Patient not taking: Reported on 06/24/2021 09/26/20   [provider]  nitrofurantoin, macrocrystal-monohydrate, (MACROBID) 100 MG capsule Take 1 capsule (100 mg total) by mouth 2 (two) times daily. Patient not taking: No sig reported 05/06/21   [name], CNM  triamcinolone ointment (KENALOG) 0.1 % Apply 1 application topically 2 (two) times daily as needed (Skin bumps). Patient not taking: No sig reported 09/26/20   [provider]  ARIPiprazole (ABILIFY) 5 MG tablet Take 1 tablet (5 mg total) by mouth 2 (two) times daily. 02/20/16 04/14/20  Denzil Magnuson, NP  etonogestrel (NEXPLANON) 68 MG IMPL implant 1 each by Subdermal route once. Implanted 02/05/16  04/14/20  [provider]  FLUoxetine (PROZAC) 40 MG capsule Take 1 capsule (40 mg total) by mouth daily. 02/20/16 04/14/20  Denzil Magnuson, NP  lamoTRIgine (LAMICTAL) 200 MG tablet Take 1 tablet (200 mg total) by mouth every evening. 02/20/16 04/14/20  Denzil Magnuson, NP  prazosin (MINIPRESS) 1 MG capsule Take 1 capsule (1 mg total) by mouth at bedtime. 02/20/16 04/14/20  Denzil Magnuson, NP    Allergies    Penicillins  Review of Systems   Review of Systems  Constitutional:  Positive for activity change.  Respiratory:  Positive for shortness of breath.   Cardiovascular:  Negative for chest pain.  Gastrointestinal:  Positive for nausea and vomiting.  Genitourinary:  Negative for pelvic pain.  All other systems reviewed and are negative.  Physical Exam Updated Vital Signs BP 101/74   Pulse 92   Temp 98.8 F (37.1 C) (Oral)   Resp 16   Ht 5'  2" (1.575 m)   LMP 01/14/2021 (Exact Date)   SpO2 100%   BMI 19.57 kg/m   Physical Exam Vitals and nursing note reviewed.  Constitutional:      Appearance: She is well-developed.  HENT:     Head: Atraumatic.  Cardiovascular:     Rate and Rhythm: Normal rate.  Pulmonary:     Effort: Pulmonary effort is normal.  Musculoskeletal:     Cervical back: Normal range of motion and neck supple.  Skin:    General: Skin is warm and dry.  Neurological:  Mental Status: She is alert and oriented to person, place, and time.    ED Results / Procedures / Treatments   Labs (all labs ordered are listed, but only abnormal results are displayed) Labs Reviewed  COMPREHENSIVE METABOLIC PANEL - Abnormal; Notable for the following components:      Result Value   Sodium 133 (*)    Glucose, Bld 102 (*)    All other components within normal limits  CBC WITH DIFFERENTIAL/PLATELET - Abnormal; Notable for the following components:   RBC 3.51 (*)    Hemoglobin 11.5 (*)    HCT 33.0 (*)    All other components within normal limits  LIPASE, BLOOD  TSH    EKG None  Date: 06/25/2021  Rate: 96  Rhythm: normal sinus rhythm  QRS Axis: normal  Intervals: normal  ST/T Wave abnormalities: normal  Conduction Disutrbances: none  Narrative Interpretation: unremarkable    Radiology No results found.  Procedures Procedures   Medications Ordered in ED Medications - No data to display  ED Course  I have reviewed the triage vital signs and the nursing notes.  Pertinent labs & imaging results that were available during my care of the patient were reviewed by me and considered in my medical decision making (see chart for details).    MDM Rules/Calculators/[name]                           20 year old female comes in with chief complaint of nausea and vomiting after eating steakS that were cooked, but were at least 21 years old and had a different taste and texture to them.  At this time she is  not appearing toxic and has no fevers, elevated white count and is hemodynamically stable.  No vomiting since 2:30 PM.  Oral challenge initiated in the ER and patient has passed.  Basic labs were ordered and they are reassuring.  No need for any antibiotics or further work-up.  She wanted TSH as a screening test which has been ordered and is also negative.  Stable for discharge.  She has no pelvic complaints, vaginal discharge or bleeding.  Do not think any OB work-up is indicated at this time  Final Clinical Impression(s) / ED Diagnoses Final diagnoses:  Nausea and vomiting in adult  Gastroenteritis    Rx / DC Orders ED Discharge Orders     None        Derwood Kaplan, MD 06/25/21 0002

## 2021-06-26 ENCOUNTER — Ambulatory Visit (INDEPENDENT_AMBULATORY_CARE_PROVIDER_SITE_OTHER): Payer: Medicaid Other

## 2021-06-26 ENCOUNTER — Ambulatory Visit (INDEPENDENT_AMBULATORY_CARE_PROVIDER_SITE_OTHER): Payer: Medicaid Other | Admitting: Advanced Practice Midwife

## 2021-06-26 ENCOUNTER — Encounter: Payer: Self-pay | Admitting: Advanced Practice Midwife

## 2021-06-26 ENCOUNTER — Other Ambulatory Visit: Payer: Self-pay

## 2021-06-26 VITALS — BP 107/63 | HR 84 | Wt 110.0 lb

## 2021-06-26 DIAGNOSIS — Z363 Encounter for antenatal screening for malformations: Secondary | ICD-10-CM

## 2021-06-26 DIAGNOSIS — Z3A20 20 weeks gestation of pregnancy: Secondary | ICD-10-CM | POA: Diagnosis not present

## 2021-06-26 DIAGNOSIS — F1290 Cannabis use, unspecified, uncomplicated: Secondary | ICD-10-CM

## 2021-06-26 DIAGNOSIS — Z3402 Encounter for supervision of normal first pregnancy, second trimester: Secondary | ICD-10-CM

## 2021-06-26 DIAGNOSIS — O99891 Other specified diseases and conditions complicating pregnancy: Secondary | ICD-10-CM

## 2021-06-26 DIAGNOSIS — R8271 Bacteriuria: Secondary | ICD-10-CM

## 2021-06-26 DIAGNOSIS — E0590 Thyrotoxicosis, unspecified without thyrotoxic crisis or storm: Secondary | ICD-10-CM

## 2021-06-26 NOTE — Progress Notes (Signed)
[place] 20+4 wks,cephalic,anterior placenta gr 0,normal ovaries,SVP of fluid 6.2 cm,CX 2.9 cm,EFW 359 g 41%,anatomy complete,no obvious abnormalities

## 2021-06-26 NOTE — Progress Notes (Signed)
   LOW-RISK PREGNANCY VISIT Patient name: Sandy Carter MRN [id number]  Date of birth: 02-29-2001 Chief Complaint:   Routine Prenatal Visit (Anatomy scan)  History of Present Illness:   Sandy Carter is a 20 y.o. G1P0 female at [redacted]w[redacted]d with an Estimated Date of Delivery: 11/09/21 being seen today for ongoing management of a low-risk pregnancy.  Today she reports  noticing a 'bump' at top of abd when standing; didn't finish Macrobid rx due to her work schedule and forgetting . Contractions: Not present. Vag. Bleeding: None.  Movement: Present. denies leaking of fluid. Review of Systems:   Pertinent items are noted in HPI Denies abnormal vaginal discharge w/ itching/odor/irritation, headaches, visual changes, shortness of breath, chest pain, abdominal pain, severe nausea/vomiting, or problems with urination or bowel movements unless otherwise stated above. Pertinent History Reviewed:  Reviewed past medical,surgical, social, obstetrical and family history.  Reviewed problem list, medications and allergies. Physical Assessment:   Vitals:   06/26/21 1144  BP: 107/63  Pulse: 84  Weight: 110 lb (49.9 kg)  Body mass index is 20.12 kg/m.        Physical Examination:   General appearance: Well appearing, and in no distress  Mental status: Alert, oriented to person, place, and time  Skin: Warm & dry  Cardiovascular: Normal heart rate noted  Respiratory: Normal respiratory effort, no distress  Abdomen: Soft, gravid, nontender; no hernia palpated; does have mod diastasis which she may be noticing   Pelvic: Cervical exam deferred         Extremities: Edema: None  Fetal Status: Fetal Heart Rate (bpm): 142 u/s   Movement: Present    Anatomy u/s: [place] 20+4 wks,cephalic,anterior placenta gr 0,normal ovaries,SVP of fluid 6.2 cm,CX 2.9 cm,EFW 359 g 41%,anatomy complete,no obvious abnormalities   No results found for this or any previous visit (from the past 24 hour(s)).  Assessment & Plan:  1)  Low-risk pregnancy G1P0 at [redacted]w[redacted]d with an Estimated Date of Delivery: 11/09/21   2) Hx ASB, didn't finish Macrobid, but will check urine today anyway  3) Hyperthyroidism, nl TSH 10/17 at [facility]  4) Intermittent SOB, reviewed hormonal changes in preg that make breathing difficult   Meds: No orders of the defined types were placed in this encounter.  Labs/procedures today: anatomy u/s; urine culture  Plan:  Continue routine obstetrical care   Reviewed: Preterm labor symptoms and general obstetric precautions including but not limited to vaginal bleeding, contractions, leaking of fluid and fetal movement were reviewed in detail with the patient.  All questions were answered. Didn't ask about home bp cuff.  Check bp weekly, let us know if >140/90.   Follow-up: Return in about 4 weeks (around 07/24/2021) for LROB, in person.  No orders of the defined types were placed in this encounter.  [name] CNM 06/26/2021 11:57 AM

## 2021-06-26 NOTE — Patient Instructions (Signed)
Sandy Carter, thank you for choosing our office today! We appreciate the opportunity to meet your healthcare needs. You may receive a short survey by mail, e-mail, or through [employer]. If you are happy with your care we would appreciate if you could take just a few minutes to complete the survey questions. We read all of your comments and take your feedback very seriously. Thank you again for choosing our office.  Center for [employer] Team at [facility] at [facility] ([address] [place], [place] [address]) Entrance C, located off of [address] Free 24/7 valet parking  Go to [email or web address] to register for FREE online childbirth classes  Call the office [phone]) or go to [facility] if: You begin to severe cramping Your water breaks.  Sometimes it is a big gush of fluid, sometimes it is just a trickle that keeps getting your panties wet or running down your legs You have vaginal bleeding.  It is normal to have a small amount of spotting if your cervix was checked.   [facility] Pediatricians/Family Doctors [facility]): [address] Colette Ribas, [phone]           [facility]: [address], [phone]                [facility]): [address], [phone] (call to ask if accepting patients) [facility] Department: [address], [place], [phone]    [facility] Pediatricians/Family Doctors [facility]): [phone] [address], [phone] [facility]: [address] [place], [phone] [facility]: [address], [phone]  [facility] Doctors  [facility]): [phone] [facility]: [address], [phone]   [facility] Doctors [facility]: [address] [phone]  [facility] Doctors  [facility]: [phone], [phone]  Home Blood Pressure Monitoring for Patients   Your provider has recommended that you check your blood pressure (BP) at least once a week at home. If you do not have a blood pressure cuff at home, one will be provided for you. Contact your provider if you have not received your monitor within 1 week.   Helpful Tips for Accurate Home Blood Pressure Checks  Don't smoke, exercise, or drink caffeine 30 minutes before checking your BP Use the restroom before checking your BP (a full bladder can raise your pressure) Relax in a comfortable upright chair Feet on the ground Left arm resting comfortably on a flat surface at the level of your heart Legs uncrossed Back supported Sit quietly and don't talk Place the cuff on your bare arm Adjust snuggly, so that only two fingertips can fit between your skin and the top of the cuff Check 2 readings separated by at least one minute Keep a log of your BP readings For a visual, please reference this diagram: http://ccnc.care/bpdiagram  Provider Name: [facility]     Phone: [phone]  Zone 1: ALL CLEAR  Continue to monitor your symptoms:  BP reading is less than 140 (top number) or less than 90 (bottom number)  No right upper stomach pain No headaches or seeing spots No feeling nauseated or throwing up No swelling in face and hands  Zone 2: CAUTION Call your doctor's office for any of the following:  BP reading is greater than 140 (top number) or greater than  90 (bottom number)  Stomach pain under your ribs in the middle or right side Headaches or seeing spots Feeling nauseated or throwing up Swelling in face and hands  Zone 3: EMERGENCY  Seek immediate medical care if you have any of the following:  BP reading is greater than160 (top number) or greater than 110 (bottom number) Severe headaches not improving with Tylenol Serious difficulty catching your breath Any worsening symptoms from  Zone 2     Second Trimester of Pregnancy The second trimester is from week 14 through week 27 (months 4 through 6). The second trimester is often a time when you feel your best. Your body has adjusted to being pregnant, and you begin to feel better physically. Usually, morning sickness has lessened or quit completely, you may have more energy, and you may have an increase in appetite. The second trimester is also a time when the fetus is growing rapidly. At the end of the sixth month, the fetus is about 9 inches long and weighs about 1 pounds. You will likely begin to feel the baby move (quickening) between 16 and 20 weeks of pregnancy. Body changes during your second trimester Your body continues to go through many changes during your second trimester. The changes vary from woman to woman. Your weight will continue to increase. You will notice your lower abdomen bulging out. You may begin to get stretch marks on your hips, abdomen, and breasts. You may develop headaches that can be relieved by medicines. The medicines should be approved by your health care provider. You may urinate more often because the fetus is pressing on your bladder. You may develop or continue to have heartburn as a result of your pregnancy. You may develop constipation because certain hormones are causing the muscles that push waste through your intestines to slow down. You may develop hemorrhoids or swollen, bulging veins (varicose veins). You may have back pain. This is caused by: Weight gain. Pregnancy hormones that are relaxing the joints in your pelvis. A shift in weight and the muscles that support your balance. Your breasts will continue to grow and they will continue to become tender. Your gums may bleed and may be sensitive to brushing and flossing. Dark spots or blotches (chloasma, mask of pregnancy) may develop on your face. This will likely fade after the baby is born. A dark line from your belly button to  the pubic area (linea nigra) may appear. This will likely fade after the baby is born. You may have changes in your hair. These can include thickening of your hair, rapid growth, and changes in texture. Some women also have hair loss during or after pregnancy, or hair that feels dry or thin. Your hair will most likely return to normal after your baby is born.  What to expect at prenatal visits During a routine prenatal visit: You will be weighed to make sure you and the fetus are growing normally. Your blood pressure will be taken. Your abdomen will be measured to track your baby's growth. The fetal heartbeat will be listened to. Any test results from the previous visit will be discussed.  Your health care provider may ask you: How you are feeling. If you are feeling the baby move. If you have had any abnormal symptoms, such as leaking fluid, bleeding, severe headaches, or abdominal cramping. If you are using any tobacco products, including cigarettes, chewing tobacco, and electronic cigarettes. If you have any questions.  Other tests that may be performed during   your second trimester include: Blood tests that check for: Low iron levels (anemia). High blood sugar that affects pregnant women (gestational diabetes) between 24 and 28 weeks. Rh antibodies. This is to check for a protein on red blood cells (Rh factor). Urine tests to check for infections, diabetes, or protein in the urine. An ultrasound to confirm the proper growth and development of the baby. An amniocentesis to check for possible genetic problems. Fetal screens for spina bifida and Down syndrome. HIV (human immunodeficiency virus) testing. Routine prenatal testing includes screening for HIV, unless you choose not to have this test.  Follow these instructions at home: Medicines Follow your health care provider's instructions regarding medicine use. Specific medicines may be either safe or unsafe to take during  pregnancy. Take a prenatal vitamin that contains at least 600 micrograms (mcg) of folic acid. If you develop constipation, try taking a stool softener if your health care provider approves. Eating and drinking Eat a balanced diet that includes fresh fruits and vegetables, whole grains, good sources of protein such as meat, eggs, or tofu, and low-fat dairy. Your health care provider will help you determine the amount of weight gain that is right for you. Avoid raw meat and uncooked cheese. These carry germs that can cause birth defects in the baby. If you have low calcium intake from food, talk to your health care provider about whether you should take a daily calcium supplement. Limit foods that are high in fat and processed sugars, such as fried and sweet foods. To prevent constipation: Drink enough fluid to keep your urine clear or pale yellow. Eat foods that are high in fiber, such as fresh fruits and vegetables, whole grains, and beans. Activity Exercise only as directed by your health care provider. Most women can continue their usual exercise routine during pregnancy. Try to exercise for 30 minutes at least 5 days a week. Stop exercising if you experience uterine contractions. Avoid heavy lifting, wear low heel shoes, and practice good posture. A sexual relationship may be continued unless your health care provider directs you otherwise. Relieving pain and discomfort Wear a good support bra to prevent discomfort from breast tenderness. Take warm sitz baths to soothe any pain or discomfort caused by hemorrhoids. Use hemorrhoid cream if your health care provider approves. Rest with your legs elevated if you have leg cramps or low back pain. If you develop varicose veins, wear support hose. Elevate your feet for 15 minutes, 3-4 times a day. Limit salt in your diet. Prenatal Care Write down your questions. Take them to your prenatal visits. Keep all your prenatal visits as told by your health  care provider. This is important. Safety Wear your seat belt at all times when driving. Make a list of emergency phone numbers, including numbers for family, friends, the hospital, and police and fire departments. General instructions Ask your health care provider for a referral to a local prenatal education class. Begin classes no later than the beginning of month 6 of your pregnancy. Ask for help if you have counseling or nutritional needs during pregnancy. Your health care provider can offer advice or refer you to specialists for help with various needs. Do not use hot tubs, steam rooms, or saunas. Do not douche or use tampons or scented sanitary pads. Do not cross your legs for long periods of time. Avoid cat litter boxes and soil used by cats. These carry germs that can cause birth defects in the baby and possibly loss of the   fetus by miscarriage or stillbirth. Avoid all smoking, herbs, alcohol, and unprescribed drugs. Chemicals in these products can affect the formation and growth of the baby. Do not use any products that contain nicotine or tobacco, such as cigarettes and e-cigarettes. If you need help quitting, ask your health care provider. Visit your dentist if you have not gone yet during your pregnancy. Use a soft toothbrush to brush your teeth and be gentle when you floss. Contact a health care provider if: You have dizziness. You have mild pelvic cramps, pelvic pressure, or nagging pain in the abdominal area. You have persistent nausea, vomiting, or diarrhea. You have a bad smelling vaginal discharge. You have pain when you urinate. Get help right away if: You have a fever. You are leaking fluid from your vagina. You have spotting or bleeding from your vagina. You have severe abdominal cramping or pain. You have rapid weight gain or weight loss. You have shortness of breath with chest pain. You notice sudden or extreme swelling of your face, hands, ankles, feet, or legs. You  have not felt your baby move in over an hour. You have severe headaches that do not go away when you take medicine. You have vision changes. Summary The second trimester is from week 14 through week 27 (months 4 through 6). It is also a time when the fetus is growing rapidly. Your body goes through many changes during pregnancy. The changes vary from woman to woman. Avoid all smoking, herbs, alcohol, and unprescribed drugs. These chemicals affect the formation and growth your baby. Do not use any tobacco products, such as cigarettes, chewing tobacco, and e-cigarettes. If you need help quitting, ask your health care provider. Contact your health care provider if you have any questions. Keep all prenatal visits as told by your health care provider. This is important. This information is not intended to replace advice given to you by your health care provider. Make sure you discuss any questions you have with your health care provider. Document Released: 08/19/2001 Document Revised: 01/31/2016 Document Reviewed: 10/26/2012 Elsevier Interactive Patient Education  2017 Elsevier Inc.  

## 2021-06-29 LAB — URINE CULTURE

## 2021-07-08 ENCOUNTER — Encounter: Payer: Self-pay | Admitting: *Deleted

## 2021-07-25 ENCOUNTER — Ambulatory Visit (INDEPENDENT_AMBULATORY_CARE_PROVIDER_SITE_OTHER): Payer: Medicaid Other | Admitting: Advanced Practice Midwife

## 2021-07-25 ENCOUNTER — Encounter: Payer: Self-pay | Admitting: Advanced Practice Midwife

## 2021-07-25 ENCOUNTER — Other Ambulatory Visit: Payer: Self-pay

## 2021-07-25 VITALS — BP 118/76 | HR 96 | Wt 116.5 lb

## 2021-07-25 DIAGNOSIS — Z3A24 24 weeks gestation of pregnancy: Secondary | ICD-10-CM

## 2021-07-25 DIAGNOSIS — Z3402 Encounter for supervision of normal first pregnancy, second trimester: Secondary | ICD-10-CM

## 2021-07-25 NOTE — Patient Instructions (Addendum)
1. Before your test, do not eat or drink anything for 8-10 hours prior to your  appointment (a small amount of water is allowed and you may take any medicines you normally take). Be sure to drink lots of water the day before. 2. When you arrive, your blood will be drawn for a 'fasting' blood sugar level.  Then you will be given a sweetened carbonated beverage to drink. You should  complete drinking this beverage within five minutes. After finishing the  beverage, you will have your blood drawn exactly 1 and 2 hours later. Having  your blood drawn on time is an important part of this test. A total of three blood  samples will be done. 3. The test takes approximately 2  hours. During the test, do not have anything to  eat or drink. Do not smoke, chew gum (not even sugarless gum) or use breath mints.  4. During the test you should remain close by and seated as much as possible and  avoid walking around. You may want to bring a book or something else to  occupy your time.  5. After your test, you may eat and drink as normal. You may want to bring a snack  to eat after the test is finished. Your provider will advise you as to the results of  this test and any follow-up if necessary  If your sugar test is positive for gestational diabetes, you will be given an phone call and further instructions discussed. If you wish to know all of your test results before your next appointment, feel free to call the office, or look up your test results on Mychart.  (The range that the lab uses for normal values of the sugar test are not necessarily the range that is used for pregnant women; if your results are within the normal range, they are definitely normal.  However, if a value is deemed "high" by the lab, it may not be too high for a pregnant woman.  We will need to discuss the results if your value(s) fall in the "high" category).     Tdap Vaccine It is recommended that you get the Tdap vaccine during the  third trimester of EACH pregnancy to help protect your baby from getting pertussis (whooping cough) 27-36 weeks is the BEST time to do this so that you can pass the protection on to your baby. During pregnancy is better than after pregnancy, but if you are unable to get it during pregnancy it will be offered at the hospital. You will be offered this vaccine in the office after 27 weeks.  If you do not have health insurance, you can get the vaccine from the [facility] Department (no appointment needed).  Everyone who will be around your baby should also be up-to-date on their vaccines. Adults (who are not pregnant) only need 1 dose of Tdap during adulthood.   [facility] Pediatricians/Family Doctors [facility]): [address] Colette Ribas, [phone]           [facility]: [address], [phone]                [facility]): [address], [phone] (call to ask if accepting patients) [facility] Department: [address], [place], [phone]    [facility] Pediatricians/Family Doctors [facility]): [phone] [address], [phone] [facility]: [address] [place], [phone] [facility]: [address], [phone]  [facility] Doctors  [facility]): [phone] [facility]: [address] [phone]   [facility] Doctors [facility]: [address], [phone]  [facility] Doctors  [facility]: [phone], [phone]   Why am I having leg cramps during pregnancy?  No one really knows why pregnant women get more leg cramps. It's possible that your leg muscles are tired from carrying around all of your extra weight. Or they may be aggravated by the pressure your expanding uterus puts on the blood vessels that return  blood from your legs to your heart and the nerves that lead from your trunk to your legs.  Leg cramps may start to plague you during your second trimester and may get worse as your pregnancy progresses and your belly gets bigger. While these cramps can occur during the day, you'll probably notice them most at night, when they can interfere with your ability to get a good night's sleep.  How can I prevent leg cramps?  Try these tips for keeping leg cramps at bay:  Avoid standing or sitting with your legs crossed for long periods of time. Stretch your calf muscles regularly during the day and several times before you go to bed. Rotate your ankles and wiggle your toes when you sit, eat dinner, or watch TV. Take a walk every day, unless your midwife or doctor has advised you not to exercise. Avoid getting too tired. Lie down on your left side to improve circulation to and from your legs. Stay hydrated during the day by drinking water regularly. Try a warm bath before bed to relax your muscles. Some research suggests that taking a magnesium supplement in addition to a prenatal vitamin may help some women avoid leg cramps. However, other research showed that magnesium supplements had no significant effect on the frequency or intensity of leg cramps during pregnancy.You may have heard that having leg cramps is a sign that you need more calcium, and that calcium supplements will relieve the problem. Though it's certainly important to get enough calcium, there's no good evidence that taking extra calcium will help prevent leg cramps during pregnancy. In fact, in one well-designed study, pregnant women taking calcium got no more relief from leg cramps than those taking a placebo.  You may try Chelated Magnesium at a dosage of 240-300mg /day.  What's the best way to relieve a cramp when I get one?  If you do get a cramp, immediately stretch your calf muscles: Straighten your leg, heel first, and gently flex  your toes back toward your shins. It might hurt at first, but it will ease the spasm and the pain will gradually go away.  You can try to relax the cramp by massaging the muscle or warming it with a hot water bottle. Walking around for a few minutes may help too.  What if the pain persists?  Call your practitioner if your muscle pain is constant and not just an occasional cramp or if you notice swelling, redness, or tenderness in your leg, or the area feels warm to your touch. These may be signs of a blood clot, which requires immediate medical attention. Blood clots are relatively rare, but they're more common during pregnancy.      Tips to Help Leg Cramps Increase dietary sources of calcium (milk, yogurt, cheese, leafy greens, seafood, legumes, and fruit) and magnesium (dark leafy greens, nuts, seeds,  fish, beans, whole grains, avocados, yogurt, bananas, dried fruit, dark chocolate) Spoonful of regular yellow mustard every night Pickle juice Magnesium supplement: in the morning, at night (can find in the vitamin aisle) Dorsiflexion of foot: pointing your toes back towards your knee during the cramp

## 2021-07-25 NOTE — Progress Notes (Signed)
   LOW-RISK PREGNANCY VISIT Patient name: Sandy Carter MRN [id number]  Date of birth: Sep 25, 2000 Chief Complaint:   Routine Prenatal Visit  History of Present Illness:   Sandy Carter is a 20 y.o. G1P0 female at [redacted]w[redacted]d with an Estimated Date of Delivery: 11/09/21 being seen today for ongoing management of a low-risk pregnancy.  Today she reports no complaints. Contractions: Irritability. Vag. Bleeding: None.  Movement: Present. denies leaking of fluid. Review of Systems:   Pertinent items are noted in HPI Denies abnormal vaginal discharge w/ itching/odor/irritation, headaches, visual changes, shortness of breath, chest pain, abdominal pain, severe nausea/vomiting, or problems with urination or bowel movements unless otherwise stated above. Pertinent History Reviewed:  Reviewed past medical,surgical, social, obstetrical and family history.  Reviewed problem list, medications and allergies. Physical Assessment:   Vitals:   07/25/21 1453  BP: 118/76  Pulse: 96  Weight: 116 lb 8 oz (52.8 kg)  Body mass index is 21.31 kg/m.        Physical Examination:   General appearance: Well appearing, and in no distress  Mental status: Alert, oriented to person, place, and time  Skin: Warm & dry  Cardiovascular: Normal heart rate noted  Respiratory: Normal respiratory effort, no distress  Abdomen: Soft, gravid, nontender  Pelvic: Cervical exam deferred         Extremities: Edema: Trace  Fetal Status:     Movement: Present    Chaperone: n/a    No results found for this or any previous visit (from the past 24 hour(s)).  Assessment & Plan:  1) Low-risk pregnancy G1P0 at [redacted]w[redacted]d with an Estimated Date of Delivery: 11/09/21   2) Hyperthyroid:  check TSH w/ PN2   Meds: No orders of the defined types were placed in this encounter.  Labs/procedures today: none  Plan:  Continue routine obstetrical care  Next visit: prefers in person    Reviewed: Preterm labor symptoms and general obstetric  precautions including but not limited to vaginal bleeding, contractions, leaking of fluid and fetal movement were reviewed in detail with the patient.  All questions were answered. has home bp cuff.. Check bp weekly, let us know if >140/90.   Follow-up: Return in about 3 weeks (around 08/15/2021) for PN2/LROB.  No orders of the defined types were placed in this encounter.  Jacklyn Shell DNP, CNM 07/25/2021 3:14 PM

## 2021-08-16 ENCOUNTER — Other Ambulatory Visit: Payer: Self-pay

## 2021-08-16 ENCOUNTER — Encounter: Payer: Self-pay | Admitting: Women's Health

## 2021-08-16 ENCOUNTER — Ambulatory Visit (INDEPENDENT_AMBULATORY_CARE_PROVIDER_SITE_OTHER): Payer: Medicaid Other | Admitting: Women's Health

## 2021-08-16 ENCOUNTER — Other Ambulatory Visit: Payer: Medicaid Other

## 2021-08-16 VITALS — BP 112/63 | HR 90 | Wt 127.2 lb

## 2021-08-16 DIAGNOSIS — Z3A27 27 weeks gestation of pregnancy: Secondary | ICD-10-CM

## 2021-08-16 DIAGNOSIS — Z3402 Encounter for supervision of normal first pregnancy, second trimester: Secondary | ICD-10-CM | POA: Diagnosis not present

## 2021-08-16 DIAGNOSIS — Z23 Encounter for immunization: Secondary | ICD-10-CM

## 2021-08-16 NOTE — Patient Instructions (Signed)
Sandy Carter, thank you for choosing our office today! We appreciate the opportunity to meet your healthcare needs. You may receive a short survey by mail, e-mail, or through [employer]. If you are happy with your care we would appreciate if you could take just a few minutes to complete the survey questions. We read all of your comments and take your feedback very seriously. Thank you again for choosing our office.  Center for [employer] Team at [facility]  [facility] at [facility] ([address] [place], [place] [address]) Entrance C, located off of [address] Free 24/7 valet parking   CLASSES: Go to [email or web address] to register for classes (childbirth, breastfeeding, waterbirth, infant CPR, daddy bootcamp, etc.)  Call the office [phone]) or go to [facility] if: You begin to have strong, frequent contractions Your water breaks.  Sometimes it is a big gush of fluid, sometimes it is just a trickle that keeps getting your panties wet or running down your legs You have vaginal bleeding.  It is normal to have a small amount of spotting if your cervix was checked.  You don't feel your baby moving like normal.  If you don't, get you something to eat and drink and lay down and focus on feeling your baby move.   If your baby is still not moving like normal, you should call the office or go to [facility].  Call the office [phone]) or go to [facility] hospital for these signs of pre-eclampsia: Severe headache that does not go away with Tylenol Visual changes- seeing spots, double, blurred vision Pain under your right breast or upper abdomen that does not go away with Tums or heartburn medicine Nausea and/or vomiting Severe swelling in your hands, feet, and face   Tdap Vaccine It is recommended that you get the Tdap vaccine during the third trimester of EACH pregnancy to help protect your baby from getting pertussis (whooping cough) 27-36 weeks is the BEST time to do  this so that you can pass the protection on to your baby. During pregnancy is better than after pregnancy, but if you are unable to get it during pregnancy it will be offered at the hospital.  You can get this vaccine with [place], at the health department, your family doctor, or some local pharmacies Everyone who will be around your baby should also be up-to-date on their vaccines before the baby comes. Adults (who are not pregnant) only need 1 dose of Tdap during adulthood.   [facility] Pediatricians/Family Doctors [facility]): [address] Colette Ribas, [phone]           [facility]: [address], [phone]                [facility]): [address], [phone] (call to ask if accepting patients) [facility] Department: [address], [place], [phone]    [facility] Pediatricians/Family Doctors [facility]): [phone] [address], [phone] [facility]: [address] [place], [phone] [facility]: [address], [phone]  [facility] Doctors  [facility]): [phone] [facility]: [address], [phone]   [facility] Doctors [facility]: [address], [phone]  [facility] Family Doctors  [facility]: [phone], [phone]  Home Blood Pressure Monitoring for Patients   Your provider has recommended that you check your  blood pressure (BP) at least once a week at home. If you do not have a blood pressure cuff at home, one will be provided for you. Contact your provider if you have not received your monitor within 1 week.   Helpful Tips for Accurate Home Blood Pressure Checks  Don't smoke, exercise, or drink caffeine 30 minutes before checking your BP Use the restroom before checking your BP (a full bladder can raise your  pressure) Relax in a comfortable upright chair Feet on the ground Left arm resting comfortably on a flat surface at the level of your heart Legs uncrossed Back supported Sit quietly and don't talk Place the cuff on your bare arm Adjust snuggly, so that only two fingertips can fit between your skin and the top of the cuff Check 2 readings separated by at least one minute Keep a log of your BP readings For a visual, please reference this diagram: http://ccnc.care/bpdiagram  Provider Name: [facility]     Phone: [phone]  Zone 1: ALL CLEAR  Continue to monitor your symptoms:  BP reading is less than 140 (top number) or less than 90 (bottom number)  No right upper stomach pain No headaches or seeing spots No feeling nauseated or throwing up No swelling in face and hands  Zone 2: CAUTION Call your doctor's office for any of the following:  BP reading is greater than 140 (top number) or greater than 90 (bottom number)  Stomach pain under your ribs in the middle or right side Headaches or seeing spots Feeling nauseated or throwing up Swelling in face and hands  Zone 3: EMERGENCY  Seek immediate medical care if you have any of the following:  BP reading is greater than160 (top number) or greater than 110 (bottom number) Severe headaches not improving with Tylenol Serious difficulty catching your breath Any worsening symptoms from Zone 2   Third Trimester of Pregnancy The third trimester is from week 29 through week 42, months 7 through 9. The third trimester is a time when the fetus is growing rapidly. At the end of the ninth month, the fetus is about 20 inches in length and weighs 6-10 pounds.  BODY CHANGES Your body goes through many changes during pregnancy. The changes vary from woman to woman.  Your weight will continue to increase. You can expect to gain 25-35 pounds (11-16 kg) by the end of the pregnancy. You may begin to get stretch marks on your hips, abdomen,  and breasts. You may urinate more often because the fetus is moving lower into your pelvis and pressing on your bladder. You may develop or continue to have heartburn as a result of your pregnancy. You may develop constipation because certain hormones are causing the muscles that push waste through your intestines to slow down. You may develop hemorrhoids or swollen, bulging veins (varicose veins). You may have pelvic pain because of the weight gain and pregnancy hormones relaxing your joints between the bones in your pelvis. Backaches may result from overexertion of the muscles supporting your posture. You may have changes in your hair. These can include thickening of your hair, rapid growth, and changes in texture. Some women also have hair loss during or after pregnancy, or hair that feels dry or thin. Your hair will most likely return to normal after your baby is born. Your breasts will continue to grow and be tender. A yellow discharge may leak from your breasts called colostrum. Your belly button may stick out. You may   feel short of breath because of your expanding uterus. You may notice the fetus "dropping," or moving lower in your abdomen. You may have a bloody mucus discharge. This usually occurs a few days to a week before labor begins. Your cervix becomes thin and soft (effaced) near your due date. WHAT TO EXPECT AT YOUR PRENATAL EXAMS  You will have prenatal exams every 2 weeks until week 36. Then, you will have weekly prenatal exams. During a routine prenatal visit: You will be weighed to make sure you and the fetus are growing normally. Your blood pressure is taken. Your abdomen will be measured to track your baby's growth. The fetal heartbeat will be listened to. Any test results from the previous visit will be discussed. You may have a cervical check near your due date to see if you have effaced. At around 36 weeks, your caregiver will check your cervix. At the same time, your  caregiver will also perform a test on the secretions of the vaginal tissue. This test is to determine if a type of bacteria, Group B streptococcus, is present. Your caregiver will explain this further. Your caregiver may ask you: What your birth plan is. How you are feeling. If you are feeling the baby move. If you have had any abnormal symptoms, such as leaking fluid, bleeding, severe headaches, or abdominal cramping. If you have any questions. Other tests or screenings that may be performed during your third trimester include: Blood tests that check for low iron levels (anemia). Fetal testing to check the health, activity level, and growth of the fetus. Testing is done if you have certain medical conditions or if there are problems during the pregnancy. FALSE LABOR You may feel small, irregular contractions that eventually go away. These are called Braxton Hicks contractions, or false labor. Contractions may last for hours, days, or even weeks before true labor sets in. If contractions come at regular intervals, intensify, or become painful, it is best to be seen by your caregiver.  SIGNS OF LABOR  Menstrual-like cramps. Contractions that are 5 minutes apart or less. Contractions that start on the top of the uterus and spread down to the lower abdomen and back. A sense of increased pelvic pressure or back pain. A watery or bloody mucus discharge that comes from the vagina. If you have any of these signs before the 37th week of pregnancy, call your caregiver right away. You need to go to the hospital to get checked immediately. HOME CARE INSTRUCTIONS  Avoid all smoking, herbs, alcohol, and unprescribed drugs. These chemicals affect the formation and growth of the baby. Follow your caregiver's instructions regarding medicine use. There are medicines that are either safe or unsafe to take during pregnancy. Exercise only as directed by your caregiver. Experiencing uterine cramps is a good sign to  stop exercising. Continue to eat regular, healthy meals. Wear a good support bra for breast tenderness. Do not use hot tubs, steam rooms, or saunas. Wear your seat belt at all times when driving. Avoid raw meat, uncooked cheese, cat litter boxes, and soil used by cats. These carry germs that can cause birth defects in the baby. Take your prenatal vitamins. Try taking a stool softener (if your caregiver approves) if you develop constipation. Eat more high-fiber foods, such as fresh vegetables or fruit and whole grains. Drink plenty of fluids to keep your urine clear or pale yellow. Take warm sitz baths to soothe any pain or discomfort caused by hemorrhoids. Use hemorrhoid cream if   your caregiver approves. If you develop varicose veins, wear support hose. Elevate your feet for 15 minutes, 3-4 times a day. Limit salt in your diet. Avoid heavy lifting, wear low heal shoes, and practice good posture. Rest a lot with your legs elevated if you have leg cramps or low back pain. Visit your dentist if you have not gone during your pregnancy. Use a soft toothbrush to brush your teeth and be gentle when you floss. A sexual relationship may be continued unless your caregiver directs you otherwise. Do not travel far distances unless it is absolutely necessary and only with the approval of your caregiver. Take prenatal classes to understand, practice, and ask questions about the labor and delivery. Make a trial run to the hospital. Pack your hospital bag. Prepare the baby's nursery. Continue to go to all your prenatal visits as directed by your caregiver. SEEK MEDICAL CARE IF: You are unsure if you are in labor or if your water has broken. You have dizziness. You have mild pelvic cramps, pelvic pressure, or nagging pain in your abdominal area. You have persistent nausea, vomiting, or diarrhea. You have a bad smelling vaginal discharge. You have pain with urination. SEEK IMMEDIATE MEDICAL CARE IF:  You  have a fever. You are leaking fluid from your vagina. You have spotting or bleeding from your vagina. You have severe abdominal cramping or pain. You have rapid weight loss or gain. You have shortness of breath with chest pain. You notice sudden or extreme swelling of your face, hands, ankles, feet, or legs. You have not felt your baby move in over an hour. You have severe headaches that do not go away with medicine. You have vision changes. Document Released: 08/19/2001 Document Revised: 08/30/2013 Document Reviewed: 10/26/2012 ExitCare Patient Information 2015 [employer]. This information is not intended to replace advice given to you by your health care provider. Make sure you discuss any questions you have with your health care provider.       

## 2021-08-16 NOTE — Progress Notes (Signed)
LOW-RISK PREGNANCY VISIT Patient name: Sandy Carter MRN [id number]  Date of birth: 2001/05/11 Chief Complaint:   Routine Prenatal Visit (PN2 today)  History of Present Illness:   Sandy Carter is a 20 y.o. G1P0 female at [redacted]w[redacted]d with an Estimated Date of Delivery: 11/09/21 being seen today for ongoing management of a low-risk pregnancy.   Today she reports  bled longer giving blood today and when nicked herself with razor . No nosebleeds or other bleeding.  Contractions: Irritability.  .  Movement: Present. denies leaking of fluid.  Depression screen [facility] 2/9 08/16/2021 05/01/2021 03/13/2021  Decreased Interest 0 0 1  Down, Depressed, Hopeless 1 0 2  PHQ - 2 Score 1 0 3  Altered sleeping 1 3 3   Tired, decreased energy 2 3 3   Change in appetite 2 0 0  Feeling bad or failure about yourself  0 0 0  Trouble concentrating 2 3 0  Moving slowly or fidgety/restless 0 0 0  Suicidal thoughts 0 0 0  PHQ-9 Score 8 9 9      GAD 7 : Generalized Anxiety Score 08/16/2021 05/01/2021 03/13/2021  Nervous, Anxious, on Edge 2 3 2   Control/stop worrying 1 3 3   Worry too much - different things 3 2 2   Trouble relaxing 1 3 0  Restless 0 0 0  Easily annoyed or irritable 1 1 2   Afraid - awful might happen 0 1 0  Total GAD 7 Score 8 13 9       Review of Systems:   Pertinent items are noted in HPI Denies abnormal vaginal discharge w/ itching/odor/irritation, headaches, visual changes, shortness of breath, chest pain, abdominal pain, severe nausea/vomiting, or problems with urination or bowel movements unless otherwise stated above. Pertinent History Reviewed:  Reviewed past medical,surgical, social, obstetrical and family history.  Reviewed problem list, medications and allergies. Physical Assessment:   Vitals:   08/16/21 0928  BP: 112/63  Pulse: 90  Weight: 127 lb 3.2 oz (57.7 kg)  Body mass index is 23.27 kg/m.        Physical Examination:   General appearance: Well appearing, and in no  distress  Mental status: Alert, oriented to person, place, and time  Skin: Warm & dry  Cardiovascular: Normal heart rate noted  Respiratory: Normal respiratory effort, no distress  Abdomen: Soft, gravid, nontender  Pelvic: Cervical exam deferred         Extremities: Edema: None  Fetal Status: Fetal Heart Rate (bpm): 150 Fundal Height: 25 cm Movement: Present    Chaperone: N/A   No results found for this or any previous visit (from the past 24 hour(s)).  Assessment & Plan:  1) Low-risk pregnancy G1P0 at [redacted]w[redacted]d with an Estimated Date of Delivery: 11/09/21   2) Bleeding longer, w/ blood draw, nicking leg while shaving, will see what platelets are today   Meds: No orders of the defined types were placed in this encounter.  Labs/procedures today: tdap and PN2  Plan:  Continue routine obstetrical care  Next visit: prefers in person    Reviewed: Preterm labor symptoms and general obstetric precautions including but not limited to vaginal bleeding, contractions, leaking of fluid and fetal movement were reviewed in detail with the patient.  All questions were answered. Does have home bp cuff. Office bp cuff given: not applicable. Check bp weekly, let know if consistently >140 and/or >90.  Follow-up: Return in about 4 weeks (around 09/13/2021) for LROB, CNM, in person.  Future Appointments  Date  Time Provider Department Center  09/13/2021 11:30 AM [name], CNM [facility]    Orders Placed This Encounter  Procedures   Tdap vaccine greater than or equal to 7yo IM    Cheral Marker CNM, [facility] 08/16/2021 10:23 AM

## 2021-08-17 LAB — GLUCOSE TOLERANCE, 2 HOURS W/ 1HR
Glucose, 1 hour: 72 mg/dL (ref 70–179)
Glucose, 2 hour: 78 mg/dL (ref 70–152)
Glucose, Fasting: 81 mg/dL (ref 70–91)

## 2021-08-17 LAB — HIV ANTIBODY (ROUTINE TESTING W REFLEX): HIV Screen 4th Generation wRfx: NONREACTIVE

## 2021-08-17 LAB — RPR: RPR Ser Ql: NONREACTIVE

## 2021-08-17 LAB — CBC
Hematocrit: 32.5 % — ABNORMAL LOW (ref 34.0–46.6)
Hemoglobin: 11 g/dL — ABNORMAL LOW (ref 11.1–15.9)
MCH: 31 pg (ref 26.6–33.0)
MCHC: 33.8 g/dL (ref 31.5–35.7)
MCV: 92 fL (ref 79–97)
Platelets: 261 10*3/uL (ref 150–450)
RBC: 3.55 x10E6/uL — ABNORMAL LOW (ref 3.77–5.28)
RDW: 11.9 % (ref 11.7–15.4)
WBC: 8.7 10*3/uL (ref 3.4–10.8)

## 2021-08-17 LAB — ANTIBODY SCREEN: Antibody Screen: NEGATIVE

## 2021-08-20 ENCOUNTER — Encounter: Payer: Self-pay | Admitting: Women's Health

## 2021-08-28 ENCOUNTER — Encounter: Payer: Self-pay | Admitting: Women's Health

## 2021-08-29 ENCOUNTER — Other Ambulatory Visit (HOSPITAL_COMMUNITY)
Admission: RE | Admit: 2021-08-29 | Discharge: 2021-08-29 | Disposition: A | Discharge status: Home / Self Care | Payer: Medicaid Other | Source: Ambulatory Visit | Attending: Obstetrics & Gynecology | Admitting: Obstetrics & Gynecology

## 2021-08-29 ENCOUNTER — Encounter: Payer: Self-pay | Admitting: Obstetrics & Gynecology

## 2021-08-29 ENCOUNTER — Ambulatory Visit (INDEPENDENT_AMBULATORY_CARE_PROVIDER_SITE_OTHER): Payer: Medicaid Other | Admitting: Women's Health

## 2021-08-29 ENCOUNTER — Other Ambulatory Visit: Payer: Self-pay

## 2021-08-29 VITALS — BP 112/78 | HR 98 | Wt 127.0 lb

## 2021-08-29 DIAGNOSIS — N898 Other specified noninflammatory disorders of vagina: Secondary | ICD-10-CM

## 2021-08-29 DIAGNOSIS — O26893 Other specified pregnancy related conditions, third trimester: Secondary | ICD-10-CM

## 2021-08-29 DIAGNOSIS — Z3403 Encounter for supervision of normal first pregnancy, third trimester: Secondary | ICD-10-CM | POA: Insufficient documentation

## 2021-08-29 NOTE — Patient Instructions (Signed)
Sandy Carter, thank you for choosing our office today! We appreciate the opportunity to meet your healthcare needs. You may receive a short survey by mail, e-mail, or through [employer]. If you are happy with your care we would appreciate if you could take just a few minutes to complete the survey questions. We read all of your comments and take your feedback very seriously. Thank you again for choosing our office.  Center for [employer] Team at [facility]  [facility] at [facility] ([address] [place], [place] [address]) Entrance C, located off of [address] Free 24/7 valet parking   CLASSES: Go to [email or web address] to register for classes (childbirth, breastfeeding, waterbirth, infant CPR, daddy bootcamp, etc.)  For your lower back pain you may: Purchase a pregnancy/maternity support belt from [employer], [employer], [employer], [employer], etc and wear it while you are up and about Take warm baths Use a heating pad to your lower back for no longer than 20 minutes at a time, and do not place near abdomen Take tylenol as needed. Please follow directions on the bottle Kinesiology tape (can get from sporting goods store), google how to tape belly for pregnancy   Call the office [phone]) or go to [facility] if: You begin to have strong, frequent contractions Your water breaks.  Sometimes it is a big gush of fluid, sometimes it is just a trickle that keeps getting your panties wet or running down your legs You have vaginal bleeding.  It is normal to have a small amount of spotting if your cervix was checked.  You don't feel your baby moving like normal.  If you don't, get you something to eat and drink and lay down and focus on feeling your baby move.   If your baby is still not moving like normal, you should call the office or go to [facility].  Call the office [phone]) or go to [facility] hospital for these signs of pre-eclampsia: Severe headache that does not  go away with Tylenol Visual changes- seeing spots, double, blurred vision Pain under your right breast or upper abdomen that does not go away with Tums or heartburn medicine Nausea and/or vomiting Severe swelling in your hands, feet, and face   Tdap Vaccine It is recommended that you get the Tdap vaccine during the third trimester of EACH pregnancy to help protect your baby from getting pertussis (whooping cough) 27-36 weeks is the BEST time to do this so that you can pass the protection on to your baby. During pregnancy is better than after pregnancy, but if you are unable to get it during pregnancy it will be offered at the hospital.  You can get this vaccine with [place], at the health department, your family doctor, or some local pharmacies Everyone who will be around your baby should also be up-to-date on their vaccines before the baby comes. Adults (who are not pregnant) only need 1 dose of Tdap during adulthood.   [facility] Pediatricians/Family Doctors  [facility]): [address] Dr. Colette Ribas, [phone]           [facility]: [address], [phone]                [facility]): [address], [phone] (call to ask if accepting patients) [facility] Department: [address], [place], [phone]    [facility] Pediatricians/Family Doctors [facility]): [phone] [address], [phone]  [facility]: [address] [place], [phone] [facility]: [address], [phone]  [facility] Doctors  [facility]): [phone] [facility]: [address], [phone]   [facility] Doctors [facility]: [address] [phone]  [facility] Doctors  [facility]: [phone], [phone]  Home Blood Pressure Monitoring for Patients   Your  provider has recommended that you check your blood pressure (BP) at least once a week at home. If you do not have a blood pressure cuff at home, one will be provided for you. Contact your provider if you have not received your monitor within 1 week.   Helpful Tips for Accurate Home Blood Pressure Checks  Don't smoke, exercise, or drink caffeine 30 minutes before checking your BP Use the restroom before checking your BP (a full bladder can raise your pressure) Relax in a comfortable upright chair Feet on the ground Left arm resting comfortably on a flat surface at the level of your heart Legs uncrossed Back supported Sit quietly and don't talk Place the cuff on your bare arm Adjust snuggly, so that only two fingertips can fit between your skin and the top of the cuff Check 2 readings separated by at least one minute Keep a log of your BP readings For a visual, please reference this diagram: http://ccnc.care/bpdiagram  Provider Name: [facility]     Phone: [phone]  Zone 1: ALL CLEAR  Continue to monitor your symptoms:  BP reading is less than 140 (top number) or less than 90 (bottom number)  No right upper stomach pain No headaches or seeing spots No feeling nauseated or throwing up No swelling in face and hands  Zone 2: CAUTION Call your doctor's office for any of the following:  BP reading is greater than 140 (top number) or greater than 90 (bottom number)  Stomach pain under your ribs in the middle or right side Headaches or seeing spots Feeling nauseated or throwing up Swelling in face and hands  Zone 3: EMERGENCY  Seek immediate medical care if you have any of the following:  BP reading is greater than160 (top number) or greater than 110 (bottom number) Severe headaches not improving with Tylenol Serious difficulty catching your breath Any worsening symptoms from Zone 2  Preterm Labor and Birth Information  The normal length of a pregnancy is 39-41 weeks.  Preterm labor is when labor starts before 37 completed weeks of pregnancy. What are the risk factors for preterm labor? Preterm labor is more likely to occur in women who: Have certain infections during pregnancy such as a bladder infection, sexually transmitted infection, or infection inside the uterus (chorioamnionitis). Have a shorter-than-normal cervix. Have gone into preterm labor before. Have had surgery on their cervix. Are younger than age 39 or older than age 29. Are African American. Are pregnant with twins or multiple babies (multiple gestation). Take street drugs or smoke while pregnant. Do not gain enough weight while pregnant. Became pregnant shortly after having been pregnant. What are the symptoms of preterm labor? Symptoms of preterm labor include: Cramps similar to those that can happen during a menstrual period. The cramps may happen with diarrhea. Pain in the abdomen or lower back. Regular uterine contractions that may feel like tightening of the abdomen. A feeling of increased pressure in the pelvis. Increased watery or bloody mucus discharge from the vagina. Water breaking (ruptured amniotic sac). Why is it  important to recognize signs of preterm labor? It is important to recognize signs of preterm labor because babies who are born prematurely may not be fully developed. This can put them at an increased risk for: Long-term (chronic) heart and lung problems. Difficulty immediately after birth with regulating body systems, including blood sugar, body temperature, heart rate, and breathing rate. Bleeding in the brain. Cerebral palsy. Learning difficulties. Death. These risks are highest for babies who are born before 34 weeks of pregnancy. How is preterm labor treated? Treatment depends on the length of your pregnancy, your condition, and the health of your baby. It may involve: Having a stitch (suture) placed in your cervix to prevent your cervix from opening too  early (cerclage). Taking or being given medicines, such as: Hormone medicines. These may be given early in pregnancy to help support the pregnancy. Medicine to stop contractions. Medicines to help mature the babys lungs. These may be prescribed if the risk of delivery is high. Medicines to prevent your baby from developing cerebral palsy. If the labor happens before 34 weeks of pregnancy, you may need to stay in the hospital. What should I do if I think I am in preterm labor? If you think that you are going into preterm labor, call your health care provider right away. How can I prevent preterm labor in future pregnancies? To increase your chance of having a full-term pregnancy: Do not use any tobacco products, such as cigarettes, chewing tobacco, and e-cigarettes. If you need help quitting, ask your health care provider. Do not use street drugs or medicines that have not been prescribed to you during your pregnancy. Talk with your health care provider before taking any herbal supplements, even if you have been taking them regularly. Make sure you gain a healthy amount of weight during your pregnancy. Watch for infection. If you think that you might have an infection, get it checked right away. Make sure to tell your health care provider if you have gone into preterm labor before. This information is not intended to replace advice given to you by your health care provider. Make sure you discuss any questions you have with your health care provider. Document Revised: 12/17/2018 Document Reviewed: 01/16/2016 Elsevier Patient Education  2020 [employer].

## 2021-08-29 NOTE — Progress Notes (Signed)
Work-in LOW-RISK PREGNANCY VISIT Patient name: Sandy Carter MRN [id number]  Date of birth: May 01, 2001 Chief Complaint:   w/i watery discharge x2 days (Cramping, lower back pain)  History of Present Illness:   Sandy Carter is a 20 y.o. G1P0 female at [redacted]w[redacted]d with an Estimated Date of Delivery: 11/09/21 being seen today for ongoing management of a low-risk pregnancy.   Today she she is being seen as a work-in for  back pain, leaking fluid x at least 2wks . Contractions: Irritability. Vag. Bleeding: None.  Movement: Present. reports leaking of fluid.  Depression screen [facility] 2/9 08/16/2021 05/01/2021 03/13/2021  Decreased Interest 0 0 1  Down, Depressed, Hopeless 1 0 2  PHQ - 2 Score 1 0 3  Altered sleeping 1 3 3   Tired, decreased energy 2 3 3   Change in appetite 2 0 0  Feeling bad or failure about yourself  0 0 0  Trouble concentrating 2 3 0  Moving slowly or fidgety/restless 0 0 0  Suicidal thoughts 0 0 0  PHQ-9 Score 8 9 9      GAD 7 : Generalized Anxiety Score 08/16/2021 05/01/2021 03/13/2021  Nervous, Anxious, on Edge 2 3 2   Control/stop worrying 1 3 3   Worry too much - different things 3 2 2   Trouble relaxing 1 3 0  Restless 0 0 0  Easily annoyed or irritable 1 1 2   Afraid - awful might happen 0 1 0  Total GAD 7 Score 8 13 9       Review of Systems:   Pertinent items are noted in HPI Denies abnormal vaginal discharge w/ itching/odor/irritation, headaches, visual changes, shortness of breath, chest pain, abdominal pain, severe nausea/vomiting, or problems with urination or bowel movements unless otherwise stated above. Pertinent History Reviewed:  Reviewed past medical,surgical, social, obstetrical and family history.  Reviewed problem list, medications and allergies. Physical Assessment:   Vitals:   08/29/21 1041  BP: 112/78  Pulse: 98  Weight: 127 lb (57.6 kg)  Body mass index is 23.23 kg/m.        Physical Examination:   General appearance: Well appearing, and in  no distress  Mental status: Alert, oriented to person, place, and time  Skin: Warm & dry  Cardiovascular: Normal heart rate noted  Respiratory: Normal respiratory effort, no distress  Abdomen: Soft, gravid, nontender  Pelvic:  SSE: cx visually closed, no pooling, no change w/ valsalva, fern & nitrazine neg, CV swab collected   Dilation: Closed Effacement (%): Thick Station: Ballotable  Extremities: Edema: None  Fetal Status: Fetal Heart Rate (bpm): 148 Fundal Height: 27 cm Movement: Present    Chaperone: Latisha Cresenzo   No results found for this or any previous visit (from the past 24 hour(s)).  Assessment & Plan:  1) Low-risk pregnancy G1P0 at [redacted]w[redacted]d with an Estimated Date of Delivery: 11/09/21   2) Vaginal d/c, CV swab sent, no evidence of ROM, reviewed ptl s/s, reasons to seek care   Meds: No orders of the defined types were placed in this encounter.  Labs/procedures today: spec exam, CV swab, and SVE  Plan:  Continue routine obstetrical care  Next visit: prefers in person    Reviewed: Preterm labor symptoms and general obstetric precautions including but not limited to vaginal bleeding, contractions, leaking of fluid and fetal movement were reviewed in detail with the patient.  All questions were answered. Does have home bp cuff. Office bp cuff given: not applicable. Check bp weekly, let know  if consistently >140 and/or >90.  Follow-up: Return for As scheduled.  Future Appointments  Date Time Provider Department Center  09/13/2021 11:30 AM [name], CNM [facility]    No orders of the defined types were placed in this encounter.  Cheral Marker CNM, [facility] 08/29/2021 12:31 PM

## 2021-08-30 LAB — CERVICOVAGINAL ANCILLARY ONLY
Bacterial Vaginitis (gardnerella): NEGATIVE
Candida Glabrata: NEGATIVE
Candida Vaginitis: NEGATIVE
Chlamydia: NEGATIVE
Comment: NEGATIVE
Comment: NEGATIVE
Comment: NEGATIVE
Comment: NEGATIVE
Comment: NEGATIVE
Comment: NORMAL
Neisseria Gonorrhea: NEGATIVE
Trichomonas: NEGATIVE

## 2021-09-13 ENCOUNTER — Ambulatory Visit (INDEPENDENT_AMBULATORY_CARE_PROVIDER_SITE_OTHER): Payer: Medicaid Other | Admitting: Advanced Practice Midwife

## 2021-09-13 ENCOUNTER — Other Ambulatory Visit: Payer: Self-pay

## 2021-09-13 VITALS — BP 115/74 | HR 87 | Wt 134.0 lb

## 2021-09-13 DIAGNOSIS — Z3A31 31 weeks gestation of pregnancy: Secondary | ICD-10-CM

## 2021-09-13 DIAGNOSIS — Z3403 Encounter for supervision of normal first pregnancy, third trimester: Secondary | ICD-10-CM

## 2021-09-13 NOTE — Patient Instructions (Signed)
Sandy Carter, thank you for choosing our office today! We appreciate the opportunity to meet your healthcare needs. You may receive a short survey by mail, e-mail, or through MyChart. If you are happy with your care we would appreciate if you could take just a few minutes to complete the survey questions. We read all of your comments and take your feedback very seriously. Thank you again for choosing our office.  [facility] [place] ([address] [place], [place] [address]) Entrance C, located off of [address] Free 24/7 valet parking   CLASSES: Go to [email or web address] to register for classes (childbirth, breastfeeding, waterbirth, infant CPR, daddy bootcamp, etc.)  Call the office ([phone]) or go to [facility] if: You begin to have strong, frequent contractions Your water breaks.  Sometimes it is a big gush of fluid, sometimes it is just a trickle that keeps getting your panties wet or running down your legs You have vaginal bleeding.  It is normal to have a small amount of spotting if your cervix was checked.  You don't feel your baby moving like normal.  If you don't, get you something to eat and drink and lay down and focus on feeling your baby move.   If your baby is still not moving like normal, you should call the office or go to [facility].  Call the office ([phone]) or [facility] for these signs of pre-eclampsia: Severe headache that does not go away with Tylenol Visual changes- seeing spots, double, blurred vision Pain under your right breast or upper abdomen that does not go away with Tums or heartburn medicine Nausea and/or vomiting Severe swelling in your hands, feet, and face   Tdap Vaccine It is recommended that you get the Tdap vaccine during the third trimester of EACH pregnancy to help protect your baby from getting pertussis (whooping cough) 27-36 weeks is the BEST time to do  this so that you can pass the protection on to your baby. During pregnancy is better than after pregnancy, but if you are unable to get it during pregnancy it will be offered at the hospital.  You can get this vaccine with us, at the health department, your family doctor, or some local pharmacies Everyone who will be around your baby should also be up-to-date on their vaccines before the baby comes. Adults (who are not pregnant) only need 1 dose of Tdap during adulthood.   [place] Pediatricians/Family Doctors [facility] ([facility]): [address], [phone]           [facility]: [address], [phone]                 [facility] ([facility]): [address], [phone] (call to ask if accepting patients) [facility]: [address], [place], [phone]    [place] Pediatricians/Family Doctors [facility] ([facility]): [address], [phone] [facility]: [address], [phone] [facility]: [address], [phone]  [place] [facility]  [facility] ([facility]): [phone] [facility]: [address], [phone]   [place] Family Doctors [facility]: [address], [phone]  [place] [facility]  [facility]: [address], [phone]  Home Blood Pressure Monitoring for Patients   Your provider has recommended that you check your   blood pressure (BP) at least once a week at home. If you do not have a blood pressure cuff at home, one will be provided for you. Contact your provider if you have not received your monitor within 1 week.   Helpful Tips for Accurate Home Blood Pressure Checks  Don't smoke, exercise, or drink caffeine 30 minutes before checking your BP Use the restroom before checking your BP (a full bladder can raise your  pressure) Relax in a comfortable upright chair Feet on the ground Left arm resting comfortably on a flat surface at the level of your heart Legs uncrossed Back supported Sit quietly and don't talk Place the cuff on your bare arm Adjust snuggly, so that only two fingertips can fit between your skin and the top of the cuff Check 2 readings separated by at least one minute Keep a log of your BP readings For a visual, please reference this diagram: http://ccnc.care/bpdiagram  Provider Name: [facility]     Phone: [phone]  Zone 1: ALL CLEAR  Continue to monitor your symptoms:  BP reading is less than 140 (top number) or less than 90 (bottom number)  No right upper stomach pain No headaches or seeing spots No feeling nauseated or throwing up No swelling in face and hands  Zone 2: CAUTION Call your doctor's office for any of the following:  BP reading is greater than 140 (top number) or greater than 90 (bottom number)  Stomach pain under your ribs in the middle or right side Headaches or seeing spots Feeling nauseated or throwing up Swelling in face and hands  Zone 3: EMERGENCY  Seek immediate medical care if you have any of the following:  BP reading is greater than160 (top number) or greater than 110 (bottom number) Severe headaches not improving with Tylenol Serious difficulty catching your breath Any worsening symptoms from Zone 2   Third Trimester of Pregnancy The third trimester is from week 29 through week 42, months 7 through 9. The third trimester is a time when the fetus is growing rapidly. At the end of the ninth month, the fetus is about 20 inches in length and weighs 6-10 pounds.  BODY CHANGES Your body goes through many changes during pregnancy. The changes vary from woman to woman.  Your weight will continue to increase. You can expect to gain 25-35 pounds (11-16 kg) by the end of the pregnancy. You may begin to get stretch marks on your hips, abdomen,  and breasts. You may urinate more often because the fetus is moving lower into your pelvis and pressing on your bladder. You may develop or continue to have heartburn as a result of your pregnancy. You may develop constipation because certain hormones are causing the muscles that push waste through your intestines to slow down. You may develop hemorrhoids or swollen, bulging veins (varicose veins). You may have pelvic pain because of the weight gain and pregnancy hormones relaxing your joints between the bones in your pelvis. Backaches may result from overexertion of the muscles supporting your posture. You may have changes in your hair. These can include thickening of your hair, rapid growth, and changes in texture. Some women also have hair loss during or after pregnancy, or hair that feels dry or thin. Your hair will most likely return to normal after your baby is born. Your breasts will continue to grow and be tender. A yellow discharge may leak from your breasts called colostrum. Your belly button may stick out. You may   feel short of breath because of your expanding uterus. You may notice the fetus "dropping," or moving lower in your abdomen. You may have a bloody mucus discharge. This usually occurs a few days to a week before labor begins. Your cervix becomes thin and soft (effaced) near your due date. WHAT TO EXPECT AT YOUR PRENATAL EXAMS  You will have prenatal exams every 2 weeks until week 36. Then, you will have weekly prenatal exams. During a routine prenatal visit: You will be weighed to make sure you and the fetus are growing normally. Your blood pressure is taken. Your abdomen will be measured to track your baby's growth. The fetal heartbeat will be listened to. Any test results from the previous visit will be discussed. You may have a cervical check near your due date to see if you have effaced. At around 36 weeks, your caregiver will check your cervix. At the same time, your  caregiver will also perform a test on the secretions of the vaginal tissue. This test is to determine if a type of bacteria, Group B streptococcus, is present. Your caregiver will explain this further. Your caregiver may ask you: What your birth plan is. How you are feeling. If you are feeling the baby move. If you have had any abnormal symptoms, such as leaking fluid, bleeding, severe headaches, or abdominal cramping. If you have any questions. Other tests or screenings that may be performed during your third trimester include: Blood tests that check for low iron levels (anemia). Fetal testing to check the health, activity level, and growth of the fetus. Testing is done if you have certain medical conditions or if there are problems during the pregnancy. FALSE LABOR You may feel small, irregular contractions that eventually go away. These are called Braxton Hicks contractions, or false labor. Contractions may last for hours, days, or even weeks before true labor sets in. If contractions come at regular intervals, intensify, or become painful, it is best to be seen by your caregiver.  SIGNS OF LABOR  Menstrual-like cramps. Contractions that are 5 minutes apart or less. Contractions that start on the top of the uterus and spread down to the lower abdomen and back. A sense of increased pelvic pressure or back pain. A watery or bloody mucus discharge that comes from the vagina. If you have any of these signs before the 37th week of pregnancy, call your caregiver right away. You need to go to the hospital to get checked immediately. HOME CARE INSTRUCTIONS  Avoid all smoking, herbs, alcohol, and unprescribed drugs. These chemicals affect the formation and growth of the baby. Follow your caregiver's instructions regarding medicine use. There are medicines that are either safe or unsafe to take during pregnancy. Exercise only as directed by your caregiver. Experiencing uterine cramps is a good sign to  stop exercising. Continue to eat regular, healthy meals. Wear a good support bra for breast tenderness. Do not use hot tubs, steam rooms, or saunas. Wear your seat belt at all times when driving. Avoid raw meat, uncooked cheese, cat litter boxes, and soil used by cats. These carry germs that can cause birth defects in the baby. Take your prenatal vitamins. Try taking a stool softener (if your caregiver approves) if you develop constipation. Eat more high-fiber foods, such as fresh vegetables or fruit and whole grains. Drink plenty of fluids to keep your urine clear or pale yellow. Take warm sitz baths to soothe any pain or discomfort caused by hemorrhoids. Use hemorrhoid cream if  your caregiver approves. If you develop varicose veins, wear support hose. Elevate your feet for 15 minutes, 3-4 times a day. Limit salt in your diet. Avoid heavy lifting, wear low heal shoes, and practice good posture. Rest a lot with your legs elevated if you have leg cramps or low back pain. Visit your dentist if you have not gone during your pregnancy. Use a soft toothbrush to brush your teeth and be gentle when you floss. A sexual relationship may be continued unless your caregiver directs you otherwise. Do not travel far distances unless it is absolutely necessary and only with the approval of your caregiver. Take prenatal classes to understand, practice, and ask questions about the labor and delivery. Make a trial run to the hospital. Pack your hospital bag. Prepare the baby's nursery. Continue to go to all your prenatal visits as directed by your caregiver. SEEK MEDICAL CARE IF: You are unsure if you are in labor or if your water has broken. You have dizziness. You have mild pelvic cramps, pelvic pressure, or nagging pain in your abdominal area. You have persistent nausea, vomiting, or diarrhea. You have a bad smelling vaginal discharge. You have pain with urination. SEEK IMMEDIATE MEDICAL CARE IF:  You  have a fever. You are leaking fluid from your vagina. You have spotting or bleeding from your vagina. You have severe abdominal cramping or pain. You have rapid weight loss or gain. You have shortness of breath with chest pain. You notice sudden or extreme swelling of your face, hands, ankles, feet, or legs. You have not felt your baby move in over an hour. You have severe headaches that do not go away with medicine. You have vision changes. Document Released: 08/19/2001 Document Revised: 08/30/2013 Document Reviewed: 10/26/2012 [facility] Patient Information 2015 [place], [place]. This information is not intended to replace advice given to you by your health care provider. Make sure you discuss any questions you have with your health care provider.

## 2021-09-13 NOTE — Progress Notes (Signed)
° °  LOW-RISK PREGNANCY VISIT Patient name: Sandy Carter MRN [id number]  Date of birth: July 20, 2001 Chief Complaint:   Routine Prenatal Visit  History of Present Illness:   Sandy Carter is a 21 y.o. G1P0 female at [redacted]w[redacted]d with an Estimated Date of Delivery: 11/09/21 being seen today for ongoing management of a low-risk pregnancy.  Today she reports  bottoms of her feet burning some at night . Contractions: Irritability. Vag. Bleeding: None.  Movement: Present. denies leaking of fluid. Review of Systems:   Pertinent items are noted in HPI Denies abnormal vaginal discharge w/ itching/odor/irritation, headaches, visual changes, shortness of breath, chest pain, abdominal pain, severe nausea/vomiting, or problems with urination or bowel movements unless otherwise stated above. Pertinent History Reviewed:  Reviewed past medical,surgical, social, obstetrical and family history.  Reviewed problem list, medications and allergies. Physical Assessment:   Vitals:   09/13/21 1148  BP: 115/74  Pulse: 87  Weight: 134 lb (60.8 kg)  Body mass index is 24.51 kg/m.        Physical Examination:   General appearance: Well appearing, and in no distress  Mental status: Alert, oriented to person, place, and time  Skin: Warm & dry  Cardiovascular: Normal heart rate noted  Respiratory: Normal respiratory effort, no distress  Abdomen: Soft, gravid, nontender  Pelvic: Cervical exam deferred         Extremities: Edema: None  Fetal Status: Fetal Heart Rate (bpm): 157 Fundal Height: 31 cm Movement: Present    No results found for this or any previous visit (from the past 24 hour(s)).  Assessment & Plan:  1) Low-risk pregnancy G1P0 at [redacted]w[redacted]d with an Estimated Date of Delivery: 11/09/21   2) Feet burning, rev'd may be from swelling/nerve pain   Meds: No orders of the defined types were placed in this encounter.  Labs/procedures today: none  Plan:  Continue routine obstetrical care   Reviewed: Preterm  labor symptoms and general obstetric precautions including but not limited to vaginal bleeding, contractions, leaking of fluid and fetal movement were reviewed in detail with the patient.  All questions were answered. Has home bp cuff.  Check bp weekly, let us know if >140/90.   Follow-up: Return in about 2 weeks (around 09/27/2021) for [place], in person.  No orders of the defined types were placed in this encounter.  Myrtis Ser CNM 09/13/2021 12:05 PM

## 2021-09-24 ENCOUNTER — Encounter: Payer: Self-pay | Admitting: Women's Health

## 2021-09-25 ENCOUNTER — Other Ambulatory Visit (HOSPITAL_COMMUNITY)
Admission: RE | Admit: 2021-09-25 | Discharge: 2021-09-25 | Disposition: A | Discharge status: Home / Self Care | Payer: Medicaid Other | Source: Ambulatory Visit | Attending: Women's Health | Admitting: Women's Health

## 2021-09-25 ENCOUNTER — Ambulatory Visit (INDEPENDENT_AMBULATORY_CARE_PROVIDER_SITE_OTHER): Payer: Medicaid Other | Admitting: Women's Health

## 2021-09-25 ENCOUNTER — Other Ambulatory Visit: Payer: Self-pay

## 2021-09-25 ENCOUNTER — Encounter: Payer: Self-pay | Admitting: Women's Health

## 2021-09-25 VITALS — BP 113/77 | HR 80 | Wt 137.0 lb

## 2021-09-25 DIAGNOSIS — O26893 Other specified pregnancy related conditions, third trimester: Secondary | ICD-10-CM | POA: Insufficient documentation

## 2021-09-25 DIAGNOSIS — N898 Other specified noninflammatory disorders of vagina: Secondary | ICD-10-CM | POA: Insufficient documentation

## 2021-09-25 DIAGNOSIS — Z3403 Encounter for supervision of normal first pregnancy, third trimester: Secondary | ICD-10-CM

## 2021-09-25 LAB — OB RESULTS CONSOLE GC/CHLAMYDIA: Gonorrhea: NEGATIVE

## 2021-09-25 NOTE — Patient Instructions (Signed)
Haja, thank you for choosing our office today! We appreciate the opportunity to meet your healthcare needs. You may receive a short survey by mail, e-mail, or through MyChart. If you are happy with your care we would appreciate if you could take just a few minutes to complete the survey questions. We read all of your comments and take your feedback very seriously. Thank you again for choosing our office.  [facility]  [facility] [place] ([address] [place], [place] [address]) Entrance C, located off of [address] Free 24/7 valet parking   CLASSES: Go to [email or web address] to register for classes (childbirth, breastfeeding, waterbirth, infant CPR, daddy bootcamp, etc.)  Call the office ([phone]) or go to [facility] if: You begin to have strong, frequent contractions Your water breaks.  Sometimes it is a big gush of fluid, sometimes it is just a trickle that keeps getting your panties wet or running down your legs You have vaginal bleeding.  It is normal to have a small amount of spotting if your cervix was checked.  You don't feel your baby moving like normal.  If you don't, get you something to eat and drink and lay down and focus on feeling your baby move.   If your baby is still not moving like normal, you should call the office or go to [facility].  Call the office ([phone]) or [facility] to [facility] for these signs of pre-eclampsia: Severe headache that does not go away with Tylenol Visual changes- seeing spots, double, blurred vision Pain under your right breast or upper abdomen that does not go away with Tums or heartburn medicine Nausea and/or vomiting Severe swelling in your hands, feet, and face   Tdap Vaccine It is recommended that you get the Tdap vaccine during the third trimester of EACH pregnancy to help protect your baby from getting pertussis (whooping cough) 27-36 weeks is the BEST time to do  this so that you can pass the protection on to your baby. During pregnancy is better than after pregnancy, but if you are unable to get it during pregnancy it will be offered at the hospital.  You can get this vaccine with us, at the health department, your family doctor, or some local pharmacies Everyone who will be around your baby should also be up-to-date on their vaccines before the baby comes. Adults (who are not pregnant) only need 1 dose of Tdap during adulthood.   [place] Pediatricians/Family Doctors [facility] ([facility]): [address], [phone]           [facility]: [address], [phone]                [facility] ([facility]): [address], [phone] (call to ask if accepting patients) [facility]: [address], [place], [phone]    [place] Pediatricians/Family Doctors [facility] ([facility]): [address], [phone] [facility]: [address], [phone] [facility]: [address], [phone]  [place] [facility]  [facility] ([facility]): [phone] [facility]: [address], [phone]   [place] Family Doctors [facility]: [address], [phone]  [place] [facility]  [facility]: [address], [phone]  Home Blood Pressure Monitoring for Patients   Your provider has recommended that you check your   blood pressure (BP) at least once a week at home. If you do not have a blood pressure cuff at home, one will be provided for you. Contact your provider if you have not received your monitor within 1 week.  ° °Helpful Tips for Accurate Home Blood Pressure Checks  °Don't smoke, exercise, or drink caffeine 30 minutes before checking your BP °Use the restroom before checking your BP (a full bladder can raise your  pressure) °Relax in a comfortable upright chair °Feet on the ground °Left arm resting comfortably on a flat surface at the level of your heart °Legs uncrossed °Back supported °Sit quietly and don't talk °Place the cuff on your bare arm °Adjust snuggly, so that only two fingertips can fit between your skin and the top of the cuff °Check 2 readings separated by at least one minute °Keep a log of your BP readings °For a visual, please reference this diagram: http://ccnc.care/bpdiagram ° °Provider Name: [facility]     Phone: [phone] ° °Zone 1: ALL CLEAR  °Continue to monitor your symptoms:  °BP reading is less than 140 (top number) or less than 90 (bottom number)  °No right upper stomach pain °No headaches or seeing spots °No feeling nauseated or throwing up °No swelling in face and hands ° °Zone 2: CAUTION °Call your doctor's office for any of the following:  °BP reading is greater than 140 (top number) or greater than 90 (bottom number)  °Stomach pain under your ribs in the middle or right side °Headaches or seeing spots °Feeling nauseated or throwing up °Swelling in face and hands ° °Zone 3: EMERGENCY  °Seek immediate medical care if you have any of the following:  °BP reading is greater than160 (top number) or greater than 110 (bottom number) °Severe headaches not improving with Tylenol °Serious difficulty catching your breath °Any worsening symptoms from Zone 2  °Preterm Labor and Birth Information ° °The normal length of a pregnancy is 39-41 weeks. Preterm labor is when labor starts before 37 completed weeks of pregnancy. °What are the risk factors for preterm labor? °Preterm labor is more likely to occur in women who: °Have certain infections during pregnancy such as a bladder infection, sexually transmitted infection, or infection inside the uterus (chorioamnionitis). °Have a shorter-than-normal cervix. °Have gone into preterm labor before. °Have had surgery on their cervix. °Are younger than age 17  or older than age 35. °Are African American. °Are pregnant with twins or multiple babies (multiple gestation). °Take street drugs or smoke while pregnant. °Do not gain enough weight while pregnant. °Became pregnant shortly after having been pregnant. °What are the symptoms of preterm labor? °Symptoms of preterm labor include: °Cramps similar to those that can happen during a menstrual period. The cramps may happen with diarrhea. °Pain in the abdomen or lower back. °Regular uterine contractions that may feel like tightening of the abdomen. °A feeling of increased pressure in the pelvis. °Increased watery or bloody mucus discharge from the vagina. °Water breaking (ruptured amniotic sac). °Why is it important to recognize signs of preterm labor? °It is important to recognize signs of preterm labor because babies who are born prematurely may not be fully developed. This can put them at an increased risk for: °Long-term (chronic) heart and lung problems. °Difficulty immediately after birth with regulating body systems, including blood sugar, body temperature, heart rate, and breathing rate. °Bleeding in the brain. °Cerebral palsy. °Learning difficulties. °Death. °These risks are highest for babies who are born before 34 weeks   of pregnancy. How is preterm labor treated? Treatment depends on the length of your pregnancy, your condition, and the health of your baby. It may involve: Having a stitch (suture) placed in your cervix to prevent your cervix from opening too early (cerclage). Taking or being given medicines, such as: Hormone medicines. These may be given early in pregnancy to help support the pregnancy. Medicine to stop contractions. Medicines to help mature the babys lungs. These may be prescribed if the risk of delivery is high. Medicines to prevent your baby from developing cerebral palsy. If the labor happens before 34 weeks of pregnancy, you may need to stay in the hospital. What should I do if I  think I am in preterm labor? If you think that you are going into preterm labor, call your health care provider right away. How can I prevent preterm labor in future pregnancies? To increase your chance of having a full-term pregnancy: Do not use any tobacco products, such as cigarettes, chewing tobacco, and e-cigarettes. If you need help quitting, ask your health care provider. Do not use street drugs or medicines that have not been prescribed to you during your pregnancy. Talk with your health care provider before taking any herbal supplements, even if you have been taking them regularly. Make sure you gain a healthy amount of weight during your pregnancy. Watch for infection. If you think that you might have an infection, get it checked right away. Make sure to tell your health care provider if you have gone into preterm labor before. This information is not intended to replace advice given to you by your health care provider. Make sure you discuss any questions you have with your health care provider. Document Revised: 12/17/2018 Document Reviewed: 01/16/2016 Elsevier Patient Education  [place].

## 2021-09-25 NOTE — Progress Notes (Signed)
Yellow vaginal discharge.

## 2021-09-25 NOTE — Progress Notes (Signed)
LOW-RISK PREGNANCY VISIT Patient name: Sandy Carter MRN [id number]  Date of birth: 02-16-2001 Chief Complaint:   Routine Prenatal Visit  History of Present Illness:   Sandy Carter is a 21 y.o. G1P0 female at [redacted]w[redacted]d with an Estimated Date of Delivery: 11/09/21 being seen today for ongoing management of a low-risk pregnancy.   Today she reports  yellow discharge she just noticed, no itching/irritation. Contractions yesterday while at work, felt like she was drinking plenty of water . Contractions: Irritability. Vag. Bleeding: None.  Movement: Present. denies leaking of fluid.  Depression screen [facility] 2/9 08/16/2021 05/01/2021 03/13/2021  Decreased Interest 0 0 1  Down, Depressed, Hopeless 1 0 2  PHQ - 2 Score 1 0 3  Altered sleeping 1 3 3   Tired, decreased energy 2 3 3   Change in appetite 2 0 0  Feeling bad or failure about yourself  0 0 0  Trouble concentrating 2 3 0  Moving slowly or fidgety/restless 0 0 0  Suicidal thoughts 0 0 0  PHQ-9 Score 8 9 9      GAD 7 : Generalized Anxiety Score 08/16/2021 05/01/2021 03/13/2021  Nervous, Anxious, on Edge 2 3 2   Control/stop worrying 1 3 3   Worry too much - different things 3 2 2   Trouble relaxing 1 3 0  Restless 0 0 0  Easily annoyed or irritable 1 1 2   Afraid - awful might happen 0 1 0  Total GAD 7 Score 8 13 9       Review of Systems:   Pertinent items are noted in HPI Denies abnormal vaginal discharge w/ itching/odor/irritation, headaches, visual changes, shortness of breath, chest pain, abdominal pain, severe nausea/vomiting, or problems with urination or bowel movements unless otherwise stated above. Pertinent History Reviewed:  Reviewed past medical,surgical, social, obstetrical and family history.  Reviewed problem list, medications and allergies. Physical Assessment:   Vitals:   09/25/21 1056  BP: 113/77  Pulse: 80  Weight: 137 lb (62.1 kg)  Body mass index is 25.06 kg/m.        Physical Examination:   General  appearance: Well appearing, and in no distress  Mental status: Alert, oriented to person, place, and time  Skin: Warm & dry  Cardiovascular: Normal heart rate noted  Respiratory: Normal respiratory effort, no distress  Abdomen: Soft, gravid, nontender  Pelvic:  spec exam cx visibly closed, mod amt yellow d/c          Extremities: Edema: Trace  Fetal Status: Fetal Heart Rate (bpm): 138 Fundal Height: 28 cm Movement: Present    Chaperone: 05/03/2021   No results found for this or any previous visit (from the past 24 hour(s)).  Assessment & Plan:  1) Low-risk pregnancy G1P0 at [redacted]w[redacted]d with an Estimated Date of Delivery: 11/09/21   2) Yellow vaginal d/c, CV swab  3) Contractions yesterday> reviewed ptl s/s, reasons to seek care  4) Uterine size <dates- will get efw/afi u/s   Meds: No orders of the defined types were placed in this encounter.  Labs/procedures today: spec exam and CV swab  Plan:  Continue routine obstetrical care  Next visit: prefers in person    Reviewed: Preterm labor symptoms and general obstetric precautions including but not limited to vaginal bleeding, contractions, leaking of fluid and fetal movement were reviewed in detail with the patient.  All questions were answered. Does have home bp cuff. Office bp cuff given: not applicable. Check bp weekly, let know if consistently >140  and/or >90.  Follow-up: Return for ASAP, US:EFW (can be in [place] if needed); then 2wks LROB w/ CNM.  No future appointments.  No orders of the defined types were placed in this encounter.  Cheral Marker CNM, [facility] 09/25/2021 11:29 AM

## 2021-09-26 ENCOUNTER — Encounter: Payer: Medicaid Other | Admitting: Women's Health

## 2021-09-26 LAB — CERVICOVAGINAL ANCILLARY ONLY
Bacterial Vaginitis (gardnerella): NEGATIVE
Candida Glabrata: NEGATIVE
Candida Vaginitis: NEGATIVE
Chlamydia: NEGATIVE
Comment: NEGATIVE
Comment: NEGATIVE
Comment: NEGATIVE
Comment: NEGATIVE
Comment: NEGATIVE
Comment: NORMAL
Neisseria Gonorrhea: NEGATIVE
Trichomonas: NEGATIVE

## 2021-09-30 ENCOUNTER — Other Ambulatory Visit: Payer: Self-pay | Admitting: Women's Health

## 2021-09-30 ENCOUNTER — Ambulatory Visit (INDEPENDENT_AMBULATORY_CARE_PROVIDER_SITE_OTHER): Payer: Medicaid Other

## 2021-09-30 ENCOUNTER — Other Ambulatory Visit: Payer: Self-pay

## 2021-09-30 DIAGNOSIS — Z3403 Encounter for supervision of normal first pregnancy, third trimester: Secondary | ICD-10-CM

## 2021-09-30 DIAGNOSIS — Z3A34 34 weeks gestation of pregnancy: Secondary | ICD-10-CM

## 2021-09-30 DIAGNOSIS — O26843 Uterine size-date discrepancy, third trimester: Secondary | ICD-10-CM

## 2021-09-30 DIAGNOSIS — O365931 Maternal care for other known or suspected poor fetal growth, third trimester, fetus 1: Secondary | ICD-10-CM

## 2021-09-30 DIAGNOSIS — F1290 Cannabis use, unspecified, uncomplicated: Secondary | ICD-10-CM

## 2021-09-30 DIAGNOSIS — O99891 Other specified diseases and conditions complicating pregnancy: Secondary | ICD-10-CM

## 2021-09-30 NOTE — Progress Notes (Signed)
[place] 0000000 wks,cephalic,BPP [id number] bpm,anterior placenta gr 2,AFI 10.1 cm,RI .64,.69,.73,.62=84%,EFW 1902 g 4.8%,Sandy Carter discussed results with patient

## 2021-10-01 ENCOUNTER — Encounter: Payer: Self-pay | Admitting: Obstetrics & Gynecology

## 2021-10-01 DIAGNOSIS — Z8759 Personal history of other complications of pregnancy, childbirth and the puerperium: Secondary | ICD-10-CM | POA: Insufficient documentation

## 2021-10-01 DIAGNOSIS — O365990 Maternal care for other known or suspected poor fetal growth, unspecified trimester, not applicable or unspecified: Secondary | ICD-10-CM | POA: Insufficient documentation

## 2021-10-03 ENCOUNTER — Other Ambulatory Visit: Payer: Medicaid Other

## 2021-10-04 ENCOUNTER — Other Ambulatory Visit: Payer: Self-pay | Admitting: Obstetrics & Gynecology

## 2021-10-04 ENCOUNTER — Ambulatory Visit (INDEPENDENT_AMBULATORY_CARE_PROVIDER_SITE_OTHER): Payer: Medicaid Other | Admitting: *Deleted

## 2021-10-04 ENCOUNTER — Other Ambulatory Visit: Payer: Self-pay

## 2021-10-04 DIAGNOSIS — O365990 Maternal care for other known or suspected poor fetal growth, unspecified trimester, not applicable or unspecified: Secondary | ICD-10-CM

## 2021-10-04 DIAGNOSIS — O0993 Supervision of high risk pregnancy, unspecified, third trimester: Secondary | ICD-10-CM | POA: Diagnosis not present

## 2021-10-04 DIAGNOSIS — O365930 Maternal care for other known or suspected poor fetal growth, third trimester, not applicable or unspecified: Secondary | ICD-10-CM

## 2021-10-04 NOTE — Progress Notes (Signed)
° °  NURSE VISIT- NST  SUBJECTIVE:  Sandy Carter is a 21 y.o. G1P0 female at [redacted]w[redacted]d, here for a NST for pregnancy complicated by FGR.  She reports active fetal movement, contractions: none, vaginal bleeding: none, membranes: intact.   OBJECTIVE:  BP 105/74    Pulse 93    Wt 142 lb (64.4 kg)    LMP 01/14/2021 (Exact Date)    BMI 25.97 kg/m   Appears well, no apparent distress  No results found for this or any previous visit (from the past 24 hour(s)).  NST: FHR baseline 145 bpm, Variability: moderate, Accelerations:present, Decelerations:  Absent= Cat 1/reactive Toco: none   ASSESSMENT: G1P0 at [redacted]w[redacted]d with FGR NST reactive  PLAN: EFM strip reviewed by Dr. Nelda Marseille   Recommendations: keep next appointment as scheduled    Alice Rieger  10/04/2021 11:49 AM

## 2021-10-05 ENCOUNTER — Encounter: Payer: Self-pay | Admitting: Women's Health

## 2021-10-07 ENCOUNTER — Other Ambulatory Visit: Payer: Self-pay

## 2021-10-07 ENCOUNTER — Ambulatory Visit (INDEPENDENT_AMBULATORY_CARE_PROVIDER_SITE_OTHER): Payer: Medicaid Other

## 2021-10-07 DIAGNOSIS — O365930 Maternal care for other known or suspected poor fetal growth, third trimester, not applicable or unspecified: Secondary | ICD-10-CM | POA: Diagnosis not present

## 2021-10-07 DIAGNOSIS — R8271 Bacteriuria: Secondary | ICD-10-CM

## 2021-10-07 DIAGNOSIS — Z3A35 35 weeks gestation of pregnancy: Secondary | ICD-10-CM

## 2021-10-07 DIAGNOSIS — F1290 Cannabis use, unspecified, uncomplicated: Secondary | ICD-10-CM

## 2021-10-07 DIAGNOSIS — O99891 Other specified diseases and conditions complicating pregnancy: Secondary | ICD-10-CM

## 2021-10-07 DIAGNOSIS — O0993 Supervision of high risk pregnancy, unspecified, third trimester: Secondary | ICD-10-CM

## 2021-10-07 NOTE — Progress Notes (Signed)
[place] 35+2 wks,cephalic,anterior placenta gr 2,BPP 8/8,FHR 150 bpm,RI .74,.66,.64,.63=77%,AFI 13 cm

## 2021-10-08 ENCOUNTER — Other Ambulatory Visit: Payer: Self-pay

## 2021-10-08 ENCOUNTER — Emergency Department (HOSPITAL_COMMUNITY)
Admission: EM | Admit: 2021-10-08 | Discharge: 2021-10-09 | Disposition: A | Discharge status: Home / Self Care | Payer: Medicaid Other | Attending: Emergency Medicine | Admitting: Emergency Medicine

## 2021-10-08 ENCOUNTER — Encounter (HOSPITAL_COMMUNITY): Payer: Self-pay | Admitting: Emergency Medicine

## 2021-10-08 DIAGNOSIS — B9689 Other specified bacterial agents as the cause of diseases classified elsewhere: Secondary | ICD-10-CM | POA: Insufficient documentation

## 2021-10-08 DIAGNOSIS — Z20822 Contact with and (suspected) exposure to covid-19: Secondary | ICD-10-CM | POA: Insufficient documentation

## 2021-10-08 DIAGNOSIS — O2341 Unspecified infection of urinary tract in pregnancy, first trimester: Secondary | ICD-10-CM | POA: Diagnosis not present

## 2021-10-08 DIAGNOSIS — O479 False labor, unspecified: Secondary | ICD-10-CM

## 2021-10-08 DIAGNOSIS — Z3A36 36 weeks gestation of pregnancy: Secondary | ICD-10-CM | POA: Insufficient documentation

## 2021-10-08 DIAGNOSIS — R8271 Bacteriuria: Secondary | ICD-10-CM

## 2021-10-08 DIAGNOSIS — O99891 Other specified diseases and conditions complicating pregnancy: Secondary | ICD-10-CM

## 2021-10-08 LAB — COMPREHENSIVE METABOLIC PANEL
ALT: 15 U/L (ref 0–44)
AST: 22 U/L (ref 15–41)
Albumin: 3.2 g/dL — ABNORMAL LOW (ref 3.5–5.0)
Alkaline Phosphatase: 110 U/L (ref 38–126)
Anion gap: 6 (ref 5–15)
BUN: 11 mg/dL (ref 6–20)
CO2: 23 mmol/L (ref 22–32)
Calcium: 9.2 mg/dL (ref 8.9–10.3)
Chloride: 106 mmol/L (ref 98–111)
Creatinine, Ser: 0.49 mg/dL (ref 0.44–1.00)
GFR, Estimated: >60 mL/min (ref >60)
Glucose, Bld: 107 mg/dL — ABNORMAL HIGH (ref 70–99)
Potassium: 3.7 mmol/L (ref 3.5–5.1)
Sodium: 135 mmol/L (ref 135–145)
Total Bilirubin: 0.4 mg/dL (ref 0.3–1.2)
Total Protein: 7.1 g/dL (ref 6.5–8.1)

## 2021-10-08 LAB — RESP PANEL BY RT-PCR (FLU A&B, COVID) ARPGX2
Influenza A by PCR: NEGATIVE
Influenza B by PCR: NEGATIVE
SARS Coronavirus 2 by RT PCR: NEGATIVE

## 2021-10-08 LAB — URINALYSIS, MICROSCOPIC (REFLEX)

## 2021-10-08 LAB — CBC
HCT: 31.5 % — ABNORMAL LOW (ref 36.0–46.0)
Hemoglobin: 10.6 g/dL — ABNORMAL LOW (ref 12.0–15.0)
MCH: 29.9 pg (ref 26.0–34.0)
MCHC: 33.7 g/dL (ref 30.0–36.0)
MCV: 88.7 fL (ref 80.0–100.0)
Platelets: 258 10*3/uL (ref 150–400)
RBC: 3.55 MIL/uL — ABNORMAL LOW (ref 3.87–5.11)
RDW: 12.4 % (ref 11.5–15.5)
WBC: 11.6 10*3/uL — ABNORMAL HIGH (ref 4.0–10.5)
nRBC: 0 % (ref 0.0–0.2)

## 2021-10-08 LAB — URINALYSIS, ROUTINE W REFLEX MICROSCOPIC
Bilirubin Urine: NEGATIVE
Glucose, UA: NEGATIVE mg/dL
Ketones, ur: NEGATIVE mg/dL
Nitrite: NEGATIVE
Protein, ur: NEGATIVE mg/dL
Specific Gravity, Urine: 1.025 (ref 1.005–1.030)
pH: 6.5 (ref 5.0–8.0)

## 2021-10-08 MED ORDER — SODIUM CHLORIDE 0.9 % IV BOLUS
1000.0000 mL | Freq: Once | INTRAVENOUS | Status: AC
  Administered 2021-10-08: 1000 mL via INTRAVENOUS

## 2021-10-08 MED ORDER — CEPHALEXIN 500 MG PO CAPS: 500.0000 mg | ORAL_CAPSULE | Freq: Two times a day (BID) | ORAL | 0 refills | Status: DC

## 2021-10-08 NOTE — Progress Notes (Signed)
Call received from [facility] ED RN regarding G1 patient at 35 wks complaining of abdominal pain and lower back pain for several hours.  Pt denies LOC, bleeding, and reports positive FM. SVE performed by ED provider prior to call to [facility], reports cervix is closed.   Patient follows up with [facility].   Pt connected to EFM at 2145.

## 2021-10-08 NOTE — Progress Notes (Signed)
[place] contacted [facility] ED for adjustment of TOCO. Pt is unsure if she is feeling any pain or discomfort since she has been evaluated.    Category I tracing.  1 uc present via TOCO.   2207 Dr. Harolyn Rutherford contacted regarding patient, instructions received for increase in PO fluids and follow up with [facility] with any further concerns or if pain continues.   Tensed, ED RN notified patient is cleared OB. Pt disconnected from EFM.

## 2021-10-08 NOTE — ED Notes (Signed)
Pt up to get urine sample

## 2021-10-08 NOTE — ED Provider Notes (Signed)
[facility] EMERGENCY DEPARTMENT Provider Note   CSN: [id number] Arrival date & time: 10/08/21  2131     History  Chief Complaint  Patient presents with   Contractions    Sandy Carter is a 21 y.o. female.  HPI Patient is at about [redacted] weeks pregnant.  First pregnancy.  Has lower abdominal pain/cramping.  Goes to the back.  No dysuria.  Has had episodes in with a day.  Has not been able to time it.  States that this happens at work and is sharp.  No vaginal bleeding or fluid rush.  States she has had some yellow discharge but that has been thick and somewhat stable.  No recent sexual intercourse.    Home Medications Prior to Admission medications   Medication Sig Start Date End Date Taking? Authorizing Provider  cephALEXin (KEFLEX) 500 MG capsule Take 1 capsule (500 mg total) by mouth 2 (two) times daily. 10/08/21  Yes Davonna Belling, MD  Acetaminophen (TYLENOL PO) Take by mouth as needed.    [provider]  Blood Pressure Monitor MISC For regular home bp monitoring during pregnancy 05/01/21   Cresenzo-Dishmon, Joaquim Lai, CNM  hydrOXYzine (ATARAX/VISTARIL) 50 MG tablet Take 50 mg by mouth every 6 (six) hours as needed for anxiety or itching (Seizures). 09/26/20   [provider]  Prenatal Vit-Fe Fumarate-FA (PRENATAL VITAMIN PO) Take by mouth.    [provider]  PROAIR HFA 108 (90 Base) MCG/ACT inhaler Inhale 1 puff into the lungs every 6 (six) hours as needed for wheezing or shortness of breath. Patient not taking: Reported on 10/04/2021 05/28/21   Roma Schanz, CNM  ARIPiprazole (ABILIFY) 5 MG tablet Take 1 tablet (5 mg total) by mouth 2 (two) times daily. 02/20/16 04/14/20  Mordecai Maes, NP  etonogestrel (NEXPLANON) 68 MG IMPL implant 1 each by Subdermal route once. Implanted 02/05/16  04/14/20  [provider]  FLUoxetine (PROZAC) 40 MG capsule Take 1 capsule (40 mg total) by mouth daily. 02/20/16 04/14/20  Mordecai Maes, NP  lamoTRIgine  (LAMICTAL) 200 MG tablet Take 1 tablet (200 mg total) by mouth every evening. 02/20/16 04/14/20  Mordecai Maes, NP  prazosin (MINIPRESS) 1 MG capsule Take 1 capsule (1 mg total) by mouth at bedtime. 02/20/16 04/14/20  Mordecai Maes, NP      Allergies    Penicillins and Penicillin g    Review of Systems   Review of Systems  Constitutional:  Negative for appetite change.  Respiratory:  Negative for shortness of breath.   Gastrointestinal:  Positive for abdominal pain.  Genitourinary:  Negative for difficulty urinating and vaginal discharge.  Musculoskeletal:  Negative for back pain.  Skin:  Negative for rash.  Neurological:  Negative for weakness.   Physical Exam Updated Vital Signs BP 110/80    Pulse 91    Temp 98.3 F (36.8 C) (Oral)    Resp 19    Ht 5\' 2"  (1.575 m)    Wt 64 kg    LMP 01/14/2021 (Exact Date)    SpO2 100%    BMI 25.79 kg/m  Physical Exam Vitals and nursing note reviewed.  HENT:     Head: Atraumatic.  Eyes:     Pupils: Pupils are equal, round, and reactive to light.  Cardiovascular:     Rate and Rhythm: Regular rhythm.  Abdominal:     Palpations: There is mass.  Genitourinary:    Comments: Pregnant well above umbilicus.  Sterile vaginal exam done and os is closed.  Musculoskeletal:        General: No tenderness.  Skin:    General: Skin is warm.     Capillary Refill: Capillary refill takes less than 2 seconds.  Neurological:     Mental Status: She is alert and oriented to person, place, and time.    ED Results / Procedures / Treatments   Labs (all labs ordered are listed, but only abnormal results are displayed) Labs Reviewed  CBC - Abnormal; Notable for the following components:      Result Value   WBC 11.6 (*)    RBC 3.55 (*)    Hemoglobin 10.6 (*)    HCT 31.5 (*)    All other components within normal limits  COMPREHENSIVE METABOLIC PANEL - Abnormal; Notable for the following components:   Glucose, Bld 107 (*)    Albumin 3.2 (*)    All other  components within normal limits  URINALYSIS, ROUTINE W REFLEX MICROSCOPIC - Abnormal; Notable for the following components:   APPearance HAZY (*)    Hgb urine dipstick TRACE (*)    Leukocytes,Ua TRACE (*)    All other components within normal limits  URINALYSIS, MICROSCOPIC (REFLEX) - Abnormal; Notable for the following components:   Bacteria, UA MANY (*)    All other components within normal limits  RESP PANEL BY RT-PCR (FLU A&B, COVID) [id number]    EKG None  Radiology US Fetal BPP W/O Non Stress  Result Date: 10/08/2021 Table formatting from the original result was not included. Images from the original result were not included.  ..an [employer] Diplomatic Services operational officer) accredited practice Center for [facility] @ [place] [address] Ordering Provider: Janyth Pupa, DO FOLLOW UP SONOGRAM Sandy Carter is in the office for a follow up sonogram for BPP and cord dopplers. She is a 21 y.o. year old G1P0 with Estimated Date of Delivery: 11/09/21 by early ultrasound now at  [redacted]w[redacted]d weeks gestation. Thus far the pregnancy has been complicated by FGR. GESTATION: SINGLETON PRESENTATION: cephalic FETAL ACTIVITY:          Heart rate         150          The fetus is active. AMNIOTIC FLUID: The amniotic fluid volume is  normal, 13 cm. PLACENTA LOCALIZATION:  anterior GRADE 2 CERVIX: Limited view ADNEXA: The ovaries are normal. GESTATIONAL AGE AND  BIOMETRICS: Gestational criteria: Estimated Date of Delivery: 11/09/21 by early ultrasound now at [redacted]w[redacted]d Previous Scans:4 BIOPHYSICAL PROFILE:                                                                                                      COMMENTS GROSS BODY MOVEMENT                 2  TONE                2  RESPIRATIONS                2  AMNIOTIC FLUID  2                                                          SCORE:  8/8 (Note: NST was not performed as part of this antepartum testing)  DOPPLER  FLOW STUDIES: UMBILICAL ARTERY RI RATIOS:    .74,.66,.64,.63=77% ANATOMICAL SURVEY                                                                            COMMENTS CEREBRAL VENTRICLES yes normal  CHOROID PLEXUS yes normal  CEREBELLUM yes normal  CISTERNA MAGNA  Yes  normal   CAVUM SEPTI PELLUCIDI YES NORMAL              NOSE/LIP yes normal  FACIAL PROFILE yes normal  4 CHAMBERED HEART yes normal  OUTFLOW TRACTS YES normaL  3VV YES NORMAL  3VTV YES NORMAL  SITUS YES NORMAL      DIAPHRAGM yes normal  STOMACH yes normal  RENAL REGION yes normal  BLADDER yes normal          3 VESSEL CORD yes normal  SPINE yes normal          GENITALIA yes normal female     SUSPECTED ABNORMALITIES:  no QUALITY OF SCAN: satisfactory TECHNICIAN COMMENTS: [place] [id number] wks,cephalic,anterior placenta gr 2,BPP 8/8,FHR 150 bpm,RI .74,.66,.64,.63=77%,AFI 13 cm A copy of this report including all images has been saved and backed up to a second source for retrieval if needed. All measures and details of the anatomical scan, placentation, fluid volume and pelvic anatomy are contained in that report. Amber Heide Guile 10/07/2021 11:38 AM Clinical Impression and recommendations: I have reviewed the sonogram results above, combined with the patient's current clinical course, below are my impressions and any appropriate recommendations for management based on the sonographic findings. 1.  G1P0 Estimated Date of Delivery: 11/09/21 by serial sonographic evaluations 2.  Fetal sonographic surveillance findings: a). Normal fluid volume b). Normal antepartum fetal assessment with BPP 8/8 c). Normal fetal Doppler ratios with consistent diastolic flow:  [id number].  Normal general sonographic findings Recommend continued prenatal evaluations and care based on this sonogram and as clinically indicated from the patient's clinical course. Clearnce Sorrel Ozan 10/08/2021 6:10 PM   [place] UA Cord Doppler  Result Date: 10/08/2021 Table formatting from the original result was not included.  Images from the original result were not included.  ..an [employer] Diplomatic Services operational officer) accredited practice Center for [facility] @ [address] [place] [address] Ordering Provider: Janyth Pupa, DO FOLLOW UP SONOGRAM Sandy Carter is in the office for a follow up sonogram for BPP and cord dopplers. She is a 21 y.o. year old G1P0 with Estimated Date of Delivery: 11/09/21 by early ultrasound now at  [redacted]w[redacted]d weeks gestation. Thus far the pregnancy has been complicated by FGR. GESTATION: SINGLETON PRESENTATION: cephalic FETAL ACTIVITY:          Heart rate         150  The fetus is active. AMNIOTIC FLUID: The amniotic fluid volume is  normal, 13 cm. PLACENTA LOCALIZATION:  anterior GRADE 2 CERVIX: Limited view ADNEXA: The ovaries are normal. GESTATIONAL AGE AND  BIOMETRICS: Gestational criteria: Estimated Date of Delivery: 11/09/21 by early ultrasound now at [redacted]w[redacted]d Previous Scans:4 BIOPHYSICAL PROFILE:                                                                                                      COMMENTS GROSS BODY MOVEMENT                 2  TONE                2  RESPIRATIONS                2  AMNIOTIC FLUID                2                                                          SCORE:  8/8 (Note: NST was not performed as part of this antepartum testing)  DOPPLER FLOW STUDIES: UMBILICAL ARTERY RI RATIOS:    .74,.66,.64,.63=77% ANATOMICAL SURVEY                                                                            COMMENTS CEREBRAL VENTRICLES yes normal  CHOROID PLEXUS yes normal  CEREBELLUM yes normal  CISTERNA MAGNA  Yes  normal   CAVUM SEPTI PELLUCIDI YES NORMAL              NOSE/LIP yes normal  FACIAL PROFILE yes normal  4 CHAMBERED HEART yes normal  OUTFLOW TRACTS YES normaL  3VV YES NORMAL  3VTV YES NORMAL  SITUS YES NORMAL      DIAPHRAGM yes normal  STOMACH yes normal  RENAL REGION yes normal  BLADDER yes normal          3 VESSEL CORD yes normal   SPINE yes normal          GENITALIA yes normal female     SUSPECTED ABNORMALITIES:  no QUALITY OF SCAN: satisfactory TECHNICIAN COMMENTS: [place] [id number] wks,cephalic,anterior placenta gr 2,BPP 8/8,FHR 150 bpm,RI .74,.66,.64,.63=77%,AFI 13 cm A copy of this report including all images has been saved and backed up to a second source for retrieval if needed. All measures and details of the anatomical scan, placentation, fluid volume and pelvic anatomy are contained in that report. Amber Heide Guile 10/07/2021 11:38 AM Clinical Impression and recommendations: I have reviewed the sonogram results above, combined with the  patient's current clinical course, below are my impressions and any appropriate recommendations for management based on the sonographic findings. 1.  G1P0 Estimated Date of Delivery: 11/09/21 by serial sonographic evaluations 2.  Fetal sonographic surveillance findings: a). Normal fluid volume b). Normal antepartum fetal assessment with BPP 8/8 c). Normal fetal Doppler ratios with consistent diastolic flow:  [id number] 3.  Normal general sonographic findings Recommend continued prenatal evaluations and care based on this sonogram and as clinically indicated from the patient's clinical course. Annalee Genta 10/08/2021 6:10 PM    Procedures Procedures    Medications Ordered in ED Medications  sodium chloride 0.9 % bolus 1,000 mL (1,000 mLs Intravenous New Bag/Given 10/08/21 2150)    ED Course/ Medical Decision Making/ [name]                           Medical Decision Making Amount and/or Complexity of Data Reviewed Labs: ordered.  Risk Prescription drug management.  Pelvic pain with pregnancy has a differential diagnosis that includes Braxton Hicks contractions, actual labor, abruption. Patient came in with possible contractions.  Has been monitored by OB/GYN and showed 1 irregular contraction.  Good heart tones.  Lab work reassuring and reviewed by me.  However urine showed bacteriuria.  Since she is pregnant  we will treat.  Appears to be able to do Keflex although has had a rash with some penicillins in the past.  Doubt preterm labor.  Does not appear to have ruptured her membranes.  With reassuring tones do not feels we need further ultrasound evaluation at this time        Final Clinical Impression(s) / ED Diagnoses Final diagnoses:  Irregular uterine contractions  Bacteriuria during pregnancy    Rx / DC Orders ED Discharge Orders          Ordered    cephALEXin (KEFLEX) 500 MG capsule  2 times daily        10/08/21 2331              Davonna Belling, MD 10/08/21 2336

## 2021-10-08 NOTE — ED Triage Notes (Signed)
Pt c/o abd contractions for a few hours, is unknown of any vaginal discharge.

## 2021-10-09 ENCOUNTER — Ambulatory Visit (INDEPENDENT_AMBULATORY_CARE_PROVIDER_SITE_OTHER): Payer: Medicaid Other | Admitting: Women's Health

## 2021-10-09 VITALS — BP 124/73 | HR 100 | Wt 143.8 lb

## 2021-10-09 DIAGNOSIS — E0590 Thyrotoxicosis, unspecified without thyrotoxic crisis or storm: Secondary | ICD-10-CM

## 2021-10-09 DIAGNOSIS — Z3403 Encounter for supervision of normal first pregnancy, third trimester: Secondary | ICD-10-CM

## 2021-10-09 DIAGNOSIS — O0993 Supervision of high risk pregnancy, unspecified, third trimester: Secondary | ICD-10-CM

## 2021-10-09 DIAGNOSIS — O365930 Maternal care for other known or suspected poor fetal growth, third trimester, not applicable or unspecified: Secondary | ICD-10-CM

## 2021-10-09 NOTE — Patient Instructions (Signed)
Sandy Carter, thank you for choosing our office today! We appreciate the opportunity to meet your healthcare needs. You may receive a short survey by mail, e-mail, or through MyChart. If you are happy with your care we would appreciate if you could take just a few minutes to complete the survey questions. We read all of your comments and take your feedback very seriously. Thank you again for choosing our office.  [facility] ([address] [place], [place] [address]) Entrance C, located off of [address] Free 24/7 valet parking   CLASSES: Go to [email or web address] to register for classes (childbirth, breastfeeding, waterbirth, infant CPR, daddy bootcamp, etc.)  Call the office ([phone]) or go to [facility] if: You begin to have strong, frequent contractions Your water breaks.  Sometimes it is a big gush of fluid, sometimes it is just a trickle that keeps getting your panties wet or running down your legs You have vaginal bleeding.  It is normal to have a small amount of spotting if your cervix was checked.  You don't feel your baby moving like normal.  If you don't, get you something to eat and drink and lay down and focus on feeling your baby move.   If your baby is still not moving like normal, you should call the office or go to [facility].  Call the office ([phone]) or go to [facility] for these signs of pre-eclampsia: Severe headache that does not go away with Tylenol Visual changes- seeing spots, double, blurred vision Pain under your right breast or upper abdomen that does not go away with Tums or heartburn medicine Nausea and/or vomiting Severe swelling in your hands, feet, and face   Tdap Vaccine It is recommended that you get the Tdap vaccine during the third trimester of EACH pregnancy to help protect your baby from getting pertussis (whooping cough) 27-36 weeks is the BEST time to do  this so that you can pass the protection on to your baby. During pregnancy is better than after pregnancy, but if you are unable to get it during pregnancy it will be offered at the hospital.  You can get this vaccine with us, at the health department, your family doctor, or some local pharmacies Everyone who will be around your baby should also be up-to-date on their vaccines before the baby comes. Adults (who are not pregnant) only need 1 dose of Tdap during adulthood.   [place] Pediatricians/Family Doctors [facility] ([facility]): [address], [phone]           [facility]: [address], [phone]                 [facility] ([facility]): [address], [phone] (call to ask if accepting patients) [facility]: [address], [place], [phone]    [place] Pediatricians/Family Doctors [facility] ([facility]): [address], [phone] [facility]: [address], [phone] [facility]: [address], [phone]  [place] [facility]  [facility] ([facility]): [phone] [facility]: [address], [phone]   [place] Family Doctors [facility]: [address], [phone]  [place] [facility]  [facility]: [address], [phone]  Home Blood Pressure Monitoring for Patients   Your provider has recommended that you check your   blood pressure (BP) at least once a week at home. If you do not have a blood pressure cuff at home, one will be provided for you. Contact your provider if you have not received your monitor within 1 week.  ° °Helpful Tips for Accurate Home Blood Pressure Checks  °Don't smoke, exercise, or drink caffeine 30 minutes before checking your BP °Use the restroom before checking your BP (a full bladder can raise your  pressure) °Relax in a comfortable upright chair °Feet on the ground °Left arm resting comfortably on a flat surface at the level of your heart °Legs uncrossed °Back supported °Sit quietly and don't talk °Place the cuff on your bare arm °Adjust snuggly, so that only two fingertips can fit between your skin and the top of the cuff °Check 2 readings separated by at least one minute °Keep a log of your BP readings °For a visual, please reference this diagram: http://ccnc.care/bpdiagram ° °Provider Name: [facility]     Phone: [phone] ° °Zone 1: ALL CLEAR  °Continue to monitor your symptoms:  °BP reading is less than 140 (top number) or less than 90 (bottom number)  °No right upper stomach pain °No headaches or seeing spots °No feeling nauseated or throwing up °No swelling in face and hands ° °Zone 2: CAUTION °Call your doctor's office for any of the following:  °BP reading is greater than 140 (top number) or greater than 90 (bottom number)  °Stomach pain under your ribs in the middle or right side °Headaches or seeing spots °Feeling nauseated or throwing up °Swelling in face and hands ° °Zone 3: EMERGENCY  °Seek immediate medical care if you have any of the following:  °BP reading is greater than160 (top number) or greater than 110 (bottom number) °Severe headaches not improving with Tylenol °Serious difficulty catching your breath °Any worsening symptoms from Zone 2  °Preterm Labor and Birth Information ° °The normal length of a pregnancy is 39-41 weeks. Preterm labor is when labor starts before 37 completed weeks of pregnancy. °What are the risk factors for preterm labor? °Preterm labor is more likely to occur in women who: °Have certain infections during pregnancy such as a bladder infection, sexually transmitted infection, or infection inside the uterus (chorioamnionitis). °Have a shorter-than-normal cervix. °Have gone into preterm labor before. °Have had surgery on their cervix. °Are younger than age 17  or older than age 35. °Are African American. °Are pregnant with twins or multiple babies (multiple gestation). °Take street drugs or smoke while pregnant. °Do not gain enough weight while pregnant. °Became pregnant shortly after having been pregnant. °What are the symptoms of preterm labor? °Symptoms of preterm labor include: °Cramps similar to those that can happen during a menstrual period. The cramps may happen with diarrhea. °Pain in the abdomen or lower back. °Regular uterine contractions that may feel like tightening of the abdomen. °A feeling of increased pressure in the pelvis. °Increased watery or bloody mucus discharge from the vagina. °Water breaking (ruptured amniotic sac). °Why is it important to recognize signs of preterm labor? °It is important to recognize signs of preterm labor because babies who are born prematurely may not be fully developed. This can put them at an increased risk for: °Long-term (chronic) heart and lung problems. °Difficulty immediately after birth with regulating body systems, including blood sugar, body temperature, heart rate, and breathing rate. °Bleeding in the brain. °Cerebral palsy. °Learning difficulties. °Death. °These risks are highest for babies who are born before 34 weeks   of pregnancy. How is preterm labor treated? Treatment depends on the length of your pregnancy, your condition, and the health of your baby. It may involve: Having a stitch (suture) placed in your cervix to prevent your cervix from opening too early (cerclage). Taking or being given medicines, such as: Hormone medicines. These may be given early in pregnancy to help support the pregnancy. Medicine to stop contractions. Medicines to help mature the babys lungs. These may be prescribed if the risk of delivery is high. Medicines to prevent your baby from developing cerebral palsy. If the labor happens before 34 weeks of pregnancy, you may need to stay in the hospital. What should I do if I  think I am in preterm labor? If you think that you are going into preterm labor, call your health care provider right away. How can I prevent preterm labor in future pregnancies? To increase your chance of having a full-term pregnancy: Do not use any tobacco products, such as cigarettes, chewing tobacco, and e-cigarettes. If you need help quitting, ask your health care provider. Do not use street drugs or medicines that have not been prescribed to you during your pregnancy. Talk with your health care provider before taking any herbal supplements, even if you have been taking them regularly. Make sure you gain a healthy amount of weight during your pregnancy. Watch for infection. If you think that you might have an infection, get it checked right away. Make sure to tell your health care provider if you have gone into preterm labor before. This information is not intended to replace advice given to you by your health care provider. Make sure you discuss any questions you have with your health care provider. Document Revised: 12/17/2018 Document Reviewed: 01/16/2016 Elsevier Patient Education  [place].

## 2021-10-09 NOTE — Progress Notes (Signed)
HIGH-RISK PREGNANCY VISIT Patient name: Sandy Carter MRN [id number]  Date of birth: Jun 22, 2001 Chief Complaint:   Routine Prenatal Visit  History of Present Illness:   Sandy Carter is a 21 y.o. G1P0 female at [redacted]w[redacted]d with an Estimated Date of Delivery: 11/09/21 being seen today for ongoing management of a high-risk pregnancy complicated by fetal growth restriction 4.8%.    Today she reports  went to ER last night w/ abd pain/contractions, better today, wants me to check cx. Thinks she has a hemorrhoid.  . Contractions: Not present. Vag. Bleeding: Scant.  Movement: Present. denies leaking of fluid.   Depression screen [facility] 2/9 08/16/2021 05/01/2021 03/13/2021  Decreased Interest 0 0 1  Down, Depressed, Hopeless 1 0 2  PHQ - 2 Score 1 0 3  Altered sleeping 1 3 3   Tired, decreased energy 2 3 3   Change in appetite 2 0 0  Feeling bad or failure about yourself  0 0 0  Trouble concentrating 2 3 0  Moving slowly or fidgety/restless 0 0 0  Suicidal thoughts 0 0 0  PHQ-9 Score 8 9 9      GAD 7 : Generalized Anxiety Score 08/16/2021 05/01/2021 03/13/2021  Nervous, Anxious, on Edge 2 3 2   Control/stop worrying 1 3 3   Worry too much - different things 3 2 2   Trouble relaxing 1 3 0  Restless 0 0 0  Easily annoyed or irritable 1 1 2   Afraid - awful might happen 0 1 0  Total GAD 7 Score 8 13 9      Review of Systems:   Pertinent items are noted in HPI Denies abnormal vaginal discharge w/ itching/odor/irritation, headaches, visual changes, shortness of breath, chest pain, abdominal pain, severe nausea/vomiting, or problems with urination or bowel movements unless otherwise stated above. Pertinent History Reviewed:  Reviewed past medical,surgical, social, obstetrical and family history.  Reviewed problem list, medications and allergies. Physical Assessment:   Vitals:   10/09/21 1605  BP: 124/73  Pulse: 100  Weight: 143 lb 12.8 oz (65.2 kg)  Body mass index is 26.3 kg/m.           Physical  Examination:   General appearance: alert, well appearing, and in no distress  Mental status: alert, oriented to person, place, and time  Skin: warm & dry   Extremities: Edema: Trace    Cardiovascular: normal heart rate noted  Respiratory: normal respiratory effort, no distress  Abdomen: gravid, soft, non-tender  Pelvic: Cervical exam performed  Dilation: Closed Effacement (%): Thick Station: Ballotable  Rectal: small non-thrombosed external hemorrhoid  Fetal Status: Fetal Heart Rate (bpm): 150 Fundal Height: 30 cm Movement: Present Presentation: Vertex  Fetal Surveillance Testing today: doppler   Chaperone: 05/03/2021    Results for orders placed or performed during the hospital encounter of 10/08/21 (from the past 24 hour(s))  Resp Panel by RT-PCR (Flu A&B, Covid) Nasopharyngeal Swab   Collection Time: 10/08/21  9:45 PM   Specimen: Nasopharyngeal Swab; Nasopharyngeal(NP) swabs in vial transport medium  Result Value Ref Range   SARS Coronavirus 2 by RT PCR NEGATIVE NEGATIVE   Influenza A by PCR NEGATIVE NEGATIVE   Influenza B by PCR NEGATIVE NEGATIVE  CBC   Collection Time: 10/08/21  9:46 PM  Result Value Ref Range   WBC 11.6 (H) 4.0 - 10.5 K/uL   RBC 3.55 (L) 3.87 - 5.11 MIL/uL   Hemoglobin 10.6 (L) 12.0 - 15.0 g/dL   HCT (L) - %  MCV 88.7 80.0 - 100.0 fL   MCH 29.9 26.0 - 34.0 pg   MCHC 33.7 30.0 - 36.0 g/dL   RDW 16.112.4 09.611.5 - 04.515.5 %   Platelets 258 150 - 400 K/uL   nRBC 0.0 0.0 - 0.2 %  Comprehensive metabolic panel   Collection Time: 10/08/21  9:46 PM  Result Value Ref Range   Sodium 135 135 - 145 mmol/L   Potassium 3.7 3.5 - 5.1 mmol/L   Chloride 106 98 - 111 mmol/L   CO2 23 22 - 32 mmol/L   Glucose, Bld 107 (H) 70 - 99 mg/dL   BUN 11 6 - 20 mg/dL   Creatinine, Ser 4.090.49 0.44 - 1.00 mg/dL   Calcium 9.2 8.9 - 81.110.3 mg/dL   Total Protein 7.1 6.5 - 8.1 g/dL   Albumin 3.2 (L) 3.5 - 5.0 g/dL   AST 22 15 - 41 U/L   ALT 15 0 - 44 U/L   Alkaline  Phosphatase 110 38 - 126 U/L   Total Bilirubin 0.4 0.3 - 1.2 mg/dL   GFR, Estimated >91>60 >47>60 mL/min   Anion gap 6 5 - 15  Urinalysis, Routine w reflex microscopic Urine, Clean Catch   Collection Time: 10/08/21 10:39 PM  Result Value Ref Range   Color, Urine YELLOW YELLOW   APPearance HAZY (A) CLEAR   Specific Gravity, Urine 1.025 1.005 - 1.030   pH 6.5 5.0 - 8.0   Glucose, UA NEGATIVE NEGATIVE mg/dL   Hgb urine dipstick TRACE (A) NEGATIVE   Bilirubin Urine NEGATIVE NEGATIVE   Ketones, ur NEGATIVE NEGATIVE mg/dL   Protein, ur NEGATIVE NEGATIVE mg/dL   Nitrite NEGATIVE NEGATIVE   Leukocytes,Ua TRACE (A) NEGATIVE  Urinalysis, Microscopic (reflex)   Collection Time: 10/08/21 10:39 PM  Result Value Ref Range   RBC / HPF 0-5 0 - 5 RBC/hpf   WBC, UA 0-5 0 - 5 WBC/hpf   Bacteria, UA MANY (A) NONE SEEN   Squamous Epithelial / LPF 11-20 0 - 5   Mucus PRESENT    Ca Oxalate Crys, UA PRESENT     Assessment & Plan:  High-risk pregnancy: G1P0 at 7539w4d with an Estimated Date of Delivery: 11/09/21   1) FGR 4.8%, stable had bpp 8/8 and UAD 77% on 1/30  2) Hyperthyroid, no meds, check TSH today for 3rd trimester check  Meds: No orders of the defined types were placed in this encounter.   Labs/procedures today: SVE  Treatment Plan: 2x/wk testing nst alt w/ bpp/dopp, IOL @ 37wks  Reviewed: Preterm labor symptoms and general obstetric precautions including but not limited to vaginal bleeding, contractions, leaking of fluid and fetal movement were reviewed in detail with the patient.  All questions were answered. Does have home bp cuff. Office bp cuff given: not applicable. Check bp weekly, let us know if consistently >140 and/or >90.  Follow-up: Return for as scheduled fri nst/nurse; needs bpp/dopp & hrob w/ md/cnm on 2/7 (gbso if none here).   Future Appointments  Date Time Provider Department Center  10/11/2021 11:50 AM [facility] NURSE [facility]  10/18/2021 11:10 AM [facility] NURSE  [facility]  10/22/2021 10:30 AM [facility] US [facility] None  10/22/2021 11:30 AM Cheral MarkerBooker, Acacia Latorre R, CNM [facility]  10/25/2021 11:30 AM [facility]  10/29/2021 11:15 AM [facility] US [facility] None  10/29/2021  1:30 PM Hermina StaggersErvin, Michael L, MD [facility]  11/01/2021 10:30 AM [facility]URSE [facility]  11/05/2021 10:30 AM [facility] [place] [facility] None  11/05/2021 11:50 AM Babetta Paterson, Merlene Laughter, CNM [facility]    Orders Placed This Encounter  Procedures   TSH   Cheral Marker  Attending Physician for the Center for [facility] 10/09/2021 4:47 PM

## 2021-10-10 ENCOUNTER — Other Ambulatory Visit: Payer: Self-pay | Admitting: Women's Health

## 2021-10-10 ENCOUNTER — Encounter: Payer: Self-pay | Admitting: *Deleted

## 2021-10-10 DIAGNOSIS — O365930 Maternal care for other known or suspected poor fetal growth, third trimester, not applicable or unspecified: Secondary | ICD-10-CM

## 2021-10-10 DIAGNOSIS — O0993 Supervision of high risk pregnancy, unspecified, third trimester: Secondary | ICD-10-CM

## 2021-10-10 LAB — TSH: TSH: 1.49 u[IU]/mL (ref 0.450–4.500)

## 2021-10-11 ENCOUNTER — Ambulatory Visit (INDEPENDENT_AMBULATORY_CARE_PROVIDER_SITE_OTHER): Payer: Medicaid Other | Admitting: *Deleted

## 2021-10-11 ENCOUNTER — Other Ambulatory Visit: Payer: Self-pay

## 2021-10-11 DIAGNOSIS — Z3A35 35 weeks gestation of pregnancy: Secondary | ICD-10-CM | POA: Diagnosis not present

## 2021-10-11 DIAGNOSIS — O365990 Maternal care for other known or suspected poor fetal growth, unspecified trimester, not applicable or unspecified: Secondary | ICD-10-CM

## 2021-10-11 DIAGNOSIS — O0993 Supervision of high risk pregnancy, unspecified, third trimester: Secondary | ICD-10-CM

## 2021-10-11 NOTE — Progress Notes (Signed)
° °  NURSE VISIT- NST  SUBJECTIVE:  Sandy Carter is a 21 y.o. G1P0 female at [redacted]w[redacted]d, here for a NST for pregnancy complicated by FGR.  She reports active fetal movement, contractions: none, vaginal bleeding: none, membranes: intact.   OBJECTIVE:  BP 125/83    Pulse 97    Wt 141 lb 6.4 oz (64.1 kg)    LMP 01/14/2021 (Exact Date)    BMI 25.86 kg/m   Appears well, no apparent distress  No results found for this or any previous visit (from the past 24 hour(s)).  NST: FHR baseline 150 bpm, Variability: moderate, Accelerations:present, Decelerations:  Absent= Cat 1/reactive Toco: none   ASSESSMENT: G1P0 at [redacted]w[redacted]d with FGR NST reactive  PLAN: EFM strip reviewed by Joellyn Haff, CNM, [facility]   Recommendations: keep next appointment as scheduled    Jobe Marker  10/11/2021 12:58 PM

## 2021-10-15 ENCOUNTER — Ambulatory Visit (INDEPENDENT_AMBULATORY_CARE_PROVIDER_SITE_OTHER): Payer: Medicaid Other | Admitting: Obstetrics & Gynecology

## 2021-10-15 ENCOUNTER — Ambulatory Visit: Payer: Medicaid Other | Admitting: *Deleted

## 2021-10-15 ENCOUNTER — Other Ambulatory Visit: Payer: Self-pay

## 2021-10-15 ENCOUNTER — Other Ambulatory Visit (HOSPITAL_COMMUNITY)
Admission: RE | Admit: 2021-10-15 | Discharge: 2021-10-15 | Disposition: A | Discharge status: Home / Self Care | Payer: Medicaid Other | Source: Ambulatory Visit | Attending: Obstetrics & Gynecology | Admitting: Obstetrics & Gynecology

## 2021-10-15 ENCOUNTER — Ambulatory Visit: Payer: Medicaid Other | Attending: Women's Health

## 2021-10-15 ENCOUNTER — Other Ambulatory Visit: Payer: Self-pay | Admitting: Family Medicine

## 2021-10-15 ENCOUNTER — Encounter: Payer: Self-pay | Admitting: Obstetrics & Gynecology

## 2021-10-15 ENCOUNTER — Other Ambulatory Visit: Payer: Self-pay | Admitting: Women's Health

## 2021-10-15 VITALS — BP 126/80 | HR 108 | Wt 141.2 lb

## 2021-10-15 VITALS — BP 129/75 | HR 108

## 2021-10-15 DIAGNOSIS — O0993 Supervision of high risk pregnancy, unspecified, third trimester: Secondary | ICD-10-CM | POA: Insufficient documentation

## 2021-10-15 DIAGNOSIS — Z3A36 36 weeks gestation of pregnancy: Secondary | ICD-10-CM

## 2021-10-15 DIAGNOSIS — O365930 Maternal care for other known or suspected poor fetal growth, third trimester, not applicable or unspecified: Secondary | ICD-10-CM

## 2021-10-15 DIAGNOSIS — R8271 Bacteriuria: Secondary | ICD-10-CM | POA: Diagnosis present

## 2021-10-15 DIAGNOSIS — O99891 Other specified diseases and conditions complicating pregnancy: Secondary | ICD-10-CM | POA: Insufficient documentation

## 2021-10-15 DIAGNOSIS — F1290 Cannabis use, unspecified, uncomplicated: Secondary | ICD-10-CM

## 2021-10-15 LAB — POCT URINALYSIS DIPSTICK OB
Blood, UA: NEGATIVE
Glucose, UA: NEGATIVE
Ketones, UA: NEGATIVE
Leukocytes, UA: NEGATIVE
Nitrite, UA: NEGATIVE
POC,PROTEIN,UA: NEGATIVE

## 2021-10-15 NOTE — Progress Notes (Signed)
HIGH-RISK PREGNANCY VISIT Patient name: Sandy Carter MRN [id number]  Date of birth: 2001/05/16 Chief Complaint:   Routine Prenatal Visit and High Risk Gestation  History of Present Illness:   Sandy Carter is a 21 y.o. G1P0 female at [redacted]w[redacted]d with an Estimated Date of Delivery: 11/09/21 being seen today for ongoing management of a high-risk pregnancy complicated by fetal growth restriction 5%.    Today she reports no complaints.   Contractions: Irritability. Vag. Bleeding: None.  Movement: Present. denies leaking of fluid.   Depression screen [facility] 2/9 08/16/2021 05/01/2021 03/13/2021  Decreased Interest 0 0 1  Down, Depressed, Hopeless 1 0 2  PHQ - 2 Score 1 0 3  Altered sleeping 1 3 3   Tired, decreased energy 2 3 3   Change in appetite 2 0 0  Feeling bad or failure about yourself  0 0 0  Trouble concentrating 2 3 0  Moving slowly or fidgety/restless 0 0 0  Suicidal thoughts 0 0 0  PHQ-9 Score 8 9 9      Current Outpatient Medications  Medication Instructions   Acetaminophen (TYLENOL PO) Oral, As needed   Blood Pressure Monitor MISC For regular home bp monitoring during pregnancy   cephALEXin (KEFLEX) 500 mg, Oral, 2 times daily   hydrOXYzine (ATARAX) 50 mg, Oral, Every 6 hours PRN   Prenatal Vit-Fe Fumarate-FA (PRENATAL VITAMIN PO) Oral   PROAIR HFA 108 (90 Base) MCG/ACT inhaler 1 puff, Inhalation, Every 6 hours PRN     Review of Systems:   Pertinent items are noted in HPI Denies abnormal vaginal discharge w/ itching/odor/irritation, headaches, visual changes, shortness of breath, chest pain, abdominal pain, severe nausea/vomiting, or problems with urination or bowel movements unless otherwise stated above. Pertinent History Reviewed:  Reviewed past medical,surgical, social, obstetrical and family history.  Reviewed problem list, medications and allergies. Physical Assessment:   Vitals:   10/15/21 1105  BP: 126/80  Pulse: (!) 108  Weight: 141 lb 3.2 oz (64 kg)  Body  mass index is 25.83 kg/m.           Physical Examination:   General appearance: alert, well appearing, and in no distress  Mental status: normal mood, behavior, speech, dress, motor activity, and thought processes  Skin: warm & dry   Extremities: Edema: Trace    Cardiovascular: normal heart rate noted  Respiratory: normal respiratory effort, no distress  Abdomen: gravid, soft, non-tender  Pelvic: Cervical exam deferred  vaginal swabs obtained        Fetal Status: Fetal Heart Rate (bpm): 130 Fundal Height: 31 cm Movement: Present    Fetal Surveillance Testing today: doppler   Chaperone: Marcelino Scot    Results for orders placed or performed in visit on 10/15/21 (from the past 24 hour(s))  POC Urinalysis Dipstick OB   Collection Time: 10/15/21 11:11 AM  Result Value Ref Range   Color, UA     Clarity, UA     Glucose, UA Negative Negative   Bilirubin, UA     Ketones, UA neg    Spec Grav, UA     Blood, UA neg    pH, UA     POC,PROTEIN,UA Negative Negative, Trace, Small (1+), Moderate (2+), Large (3+), 4+   Urobilinogen, UA     Nitrite, UA neg    Leukocytes, UA Negative Negative   Appearance     Odor       Assessment & Plan:  High-risk pregnancy: G1P0 at [redacted]w[redacted]d with an Estimated Date of  Delivery: 11/09/21   1) IUGR -followed by doppler weekly, NST later this week, [place] scheduled with MFM later today -IOL scheduled for 37wks (Saturday)  Meds: No orders of the defined types were placed in this encounter.   Labs/procedures today: GBS/GC/C obtained  Treatment Plan:  as outlined above  Reviewed: Preterm labor symptoms and general obstetric precautions including but not limited to vaginal bleeding, contractions, leaking of fluid and fetal movement were reviewed in detail with the patient.  All questions were answered.   Follow-up: Return in about 1 week (around 10/22/2021) for [place] visit as scheduled, needs 5wk PPV (from 2/15).   Future Appointments  Date Time Provider  [place]  10/15/2021  2:15 PM [facility] NURSE [facility]  10/15/2021  2:30 PM [facility] US3 [facility]  10/18/2021 11:10 AM [facility] NURSE [facility]    Orders Placed This Encounter  Procedures   Strep Gp B NAA+Rflx   POC Urinalysis Dipstick OB    Janyth Pupa, DO Attending [place], Margaret for [employer], [place]

## 2021-10-16 ENCOUNTER — Telehealth (HOSPITAL_COMMUNITY): Payer: Self-pay | Admitting: *Deleted

## 2021-10-16 ENCOUNTER — Other Ambulatory Visit: Payer: Self-pay

## 2021-10-16 ENCOUNTER — Encounter (HOSPITAL_COMMUNITY): Payer: Self-pay | Admitting: Obstetrics and Gynecology

## 2021-10-16 ENCOUNTER — Encounter: Payer: Self-pay | Admitting: Women's Health

## 2021-10-16 ENCOUNTER — Inpatient Hospital Stay (HOSPITAL_COMMUNITY)
Admission: AD | Admit: 2021-10-16 | Discharge: 2021-10-22 | DRG: 787 | Disposition: A | Discharge status: Home / Self Care | Payer: Medicaid Other | Attending: Obstetrics & Gynecology | Admitting: Obstetrics & Gynecology

## 2021-10-16 DIAGNOSIS — O9081 Anemia of the puerperium: Secondary | ICD-10-CM | POA: Diagnosis not present

## 2021-10-16 DIAGNOSIS — Z9049 Acquired absence of other specified parts of digestive tract: Secondary | ICD-10-CM

## 2021-10-16 DIAGNOSIS — Z880 Allergy status to penicillin: Secondary | ICD-10-CM

## 2021-10-16 DIAGNOSIS — O9962 Diseases of the digestive system complicating childbirth: Principal | ICD-10-CM | POA: Diagnosis present

## 2021-10-16 DIAGNOSIS — O365990 Maternal care for other known or suspected poor fetal growth, unspecified trimester, not applicable or unspecified: Secondary | ICD-10-CM | POA: Diagnosis present

## 2021-10-16 DIAGNOSIS — E0590 Thyrotoxicosis, unspecified without thyrotoxic crisis or storm: Secondary | ICD-10-CM | POA: Diagnosis present

## 2021-10-16 DIAGNOSIS — F1290 Cannabis use, unspecified, uncomplicated: Secondary | ICD-10-CM | POA: Diagnosis present

## 2021-10-16 DIAGNOSIS — Z98891 History of uterine scar from previous surgery: Secondary | ICD-10-CM | POA: Diagnosis not present

## 2021-10-16 DIAGNOSIS — R8271 Bacteriuria: Secondary | ICD-10-CM

## 2021-10-16 DIAGNOSIS — O99891 Other specified diseases and conditions complicating pregnancy: Secondary | ICD-10-CM

## 2021-10-16 DIAGNOSIS — Z3A36 36 weeks gestation of pregnancy: Secondary | ICD-10-CM

## 2021-10-16 DIAGNOSIS — D62 Acute posthemorrhagic anemia: Secondary | ICD-10-CM | POA: Diagnosis not present

## 2021-10-16 DIAGNOSIS — O0990 Supervision of high risk pregnancy, unspecified, unspecified trimester: Secondary | ICD-10-CM

## 2021-10-16 DIAGNOSIS — Z8639 Personal history of other endocrine, nutritional and metabolic disease: Secondary | ICD-10-CM | POA: Diagnosis present

## 2021-10-16 DIAGNOSIS — O365930 Maternal care for other known or suspected poor fetal growth, third trimester, not applicable or unspecified: Secondary | ICD-10-CM | POA: Diagnosis present

## 2021-10-16 DIAGNOSIS — Z8759 Personal history of other complications of pregnancy, childbirth and the puerperium: Secondary | ICD-10-CM | POA: Diagnosis present

## 2021-10-16 DIAGNOSIS — Z20822 Contact with and (suspected) exposure to covid-19: Secondary | ICD-10-CM | POA: Diagnosis present

## 2021-10-16 DIAGNOSIS — O99619 Diseases of the digestive system complicating pregnancy, unspecified trimester: Secondary | ICD-10-CM

## 2021-10-16 DIAGNOSIS — K3580 Unspecified acute appendicitis: Secondary | ICD-10-CM | POA: Diagnosis present

## 2021-10-16 DIAGNOSIS — O894 Spinal and epidural anesthesia-induced headache during the puerperium: Secondary | ICD-10-CM | POA: Diagnosis not present

## 2021-10-16 LAB — URINALYSIS, ROUTINE W REFLEX MICROSCOPIC
Bilirubin Urine: NEGATIVE
Glucose, UA: NEGATIVE mg/dL
Hgb urine dipstick: NEGATIVE
Ketones, ur: 80 mg/dL — AB
Nitrite: NEGATIVE
Protein, ur: 30 mg/dL — AB
Specific Gravity, Urine: 1.021 (ref 1.005–1.030)
pH: 5 (ref 5.0–8.0)

## 2021-10-16 LAB — CBC WITH DIFFERENTIAL/PLATELET
Abs Immature Granulocytes: 0.07 10*3/uL (ref 0.00–0.07)
Basophils Absolute: 0 10*3/uL (ref 0.0–0.1)
Basophils Relative: 0 %
Eosinophils Absolute: 0 10*3/uL (ref 0.0–0.5)
Eosinophils Relative: 0 %
HCT: 30.5 % — ABNORMAL LOW (ref 36.0–46.0)
Hemoglobin: 9.9 g/dL — ABNORMAL LOW (ref 12.0–15.0)
Immature Granulocytes: 0 %
Lymphocytes Relative: 9 %
Lymphs Abs: 1.7 10*3/uL (ref 0.7–4.0)
MCH: 28.7 pg (ref 26.0–34.0)
MCHC: 32.5 g/dL (ref 30.0–36.0)
MCV: 88.4 fL (ref 80.0–100.0)
Monocytes Absolute: 0.7 10*3/uL (ref 0.1–1.0)
Monocytes Relative: 4 %
Neutro Abs: 16.2 10*3/uL — ABNORMAL HIGH (ref 1.7–7.7)
Neutrophils Relative %: 87 %
Platelets: 261 10*3/uL (ref 150–400)
RBC: 3.45 MIL/uL — ABNORMAL LOW (ref 3.87–5.11)
RDW: 12.7 % (ref 11.5–15.5)
WBC: 18.7 10*3/uL — ABNORMAL HIGH (ref 4.0–10.5)
nRBC: 0 % (ref 0.0–0.2)

## 2021-10-16 LAB — CERVICOVAGINAL ANCILLARY ONLY
Chlamydia: NEGATIVE
Comment: NEGATIVE
Comment: NORMAL
Neisseria Gonorrhea: NEGATIVE

## 2021-10-16 MED ORDER — HYOSCYAMINE SULFATE 0.125 MG SL SUBL
0.1250 mg | SUBLINGUAL_TABLET | Freq: Once | SUBLINGUAL | Status: AC
  Administered 2021-10-16: 0.125 mg via SUBLINGUAL
  Filled 2021-10-16: qty 1

## 2021-10-16 MED ORDER — FENTANYL CITRATE (PF) 100 MCG/2ML IJ SOLN
50.0000 ug | Freq: Once | INTRAMUSCULAR | Status: AC
  Administered 2021-10-17: 50 ug via INTRAVENOUS
  Filled 2021-10-16: qty 2

## 2021-10-16 MED ORDER — LACTATED RINGERS IV SOLN: Freq: Once | INTRAVENOUS | Status: AC

## 2021-10-16 MED ORDER — ONDANSETRON HCL 4 MG/2ML IJ SOLN
4.0000 mg | Freq: Once | INTRAMUSCULAR | Status: AC
  Administered 2021-10-16: 4 mg via INTRAVENOUS
  Filled 2021-10-16: qty 2

## 2021-10-16 NOTE — MAU Provider Note (Addendum)
Chief Complaint:  Abdominal Pain, Emesis, and Nausea   Event Date/Time   First Provider Initiated Contact with Patient 10/16/21 2252     HPI: Sandy Carter is a 21 y.o. G1P0 at 5w4dwho presents to maternity admissions reporting pain in abdomen, mostly right side with vomiting and diarrhea.  Vomiting started this morning and diarrhea later.  . She reports good fetal movement, denies LOF, vaginal bleeding, urinary symptoms, h/a, dizziness, constipation or fever/chills.   Abdominal Pain This is a new problem. The current episode started today. The onset quality is gradual. The problem occurs constantly. The problem is unchanged. The pain is located in the RLQ. The quality of the pain is described as sharp and cramping. The pain does not radiate. Associated symptoms include diarrhea, nausea and vomiting. Pertinent negatives include no constipation, dysuria, fever, frequency, headaches or myalgias. Nothing relieves the symptoms.  Emesis  This is a new problem. The current episode started today. The problem occurs 5 to 10 times per day. There has been no fever. Associated symptoms include abdominal pain and diarrhea. Pertinent negatives include no fever, headaches or myalgias. Risk factors: No ill contacts.   .  RN Note: Pt reports nausea, vomiting , diarrhea, and cramping since this am. States pain is in her right side, right lower back, and lower abd. Pt reports positive fetal movement  Past Medical History: Past Medical History:  Diagnosis Date   ADD (attention deficit disorder)     in her teens   Anxiety    Depression    Dysrhythmia    palpitations - from Hyperthyroidism   Headache    Hyperthyroidism    not treated at this time   Lumbar herniated disc    ODD (oppositional defiant disorder)    in her teens   Seizures (HCC)    shakes alot, is aware of what is going on, feels them coming on, has not seen a neurologist   Tuberculosis    before age 79    Past obstetric history: OB  History  Gravida Para Term Preterm AB Living  1            SAB IAB Ectopic Multiple Live Births               # Outcome Date GA Lbr Len/2nd Weight Sex Delivery Anes PTL Lv  1 Current             Past Surgical History: Past Surgical History:  Procedure Laterality Date   COLONOSCOPY WITH PROPOFOL N/A 09/17/2020   Procedure: COLONOSCOPY WITH PROPOFOL;  Surgeon: Lanelle Bal, DO;  [facility]: [facility];  Service: Endoscopy;  Laterality: N/A;  2:00pm   LUMBAR LAMINECTOMY/DECOMPRESSION MICRODISCECTOMY Right 12/28/2020   Procedure: Right Lumbar Five Sacral One Microdiscectomy;  Surgeon: Coletta Memos, MD;  Location: [facility];  Service: Neurosurgery;  Laterality: Right;   WISDOM TOOTH EXTRACTION      Family History: Family History  Adopted: Yes  Problem Relation Age of Onset   Alcoholism Mother    Bone cancer Mother    Diabetes Maternal Grandmother     Social History: Social History   Tobacco Use   Smoking status: Never   Smokeless tobacco: Never  Vaping Use   Vaping Use: Former  Substance Use Topics   Alcohol use: Not Currently   Drug use: Not Currently    Types: Marijuana    Comment: last use nov 10th 2022    Allergies:  Allergies  Allergen Reactions   Penicillins  Rash    Reaction: 5 years   Penicillin G Other (See Comments)    Meds:  Medications Prior to Admission  Medication Sig Dispense Refill Last Dose   Acetaminophen (TYLENOL PO) Take by mouth as needed.      Blood Pressure Monitor MISC For regular home bp monitoring during pregnancy (Patient not taking: Reported on 10/15/2021) 1 each 0    hydrOXYzine (ATARAX/VISTARIL) 50 MG tablet Take 50 mg by mouth every 6 (six) hours as needed for anxiety or itching (Seizures).      Prenatal Vit-Fe Fumarate-FA (PRENATAL VITAMIN PO) Take by mouth.      PROAIR HFA 108 (90 Base) MCG/ACT inhaler Inhale 1 puff into the lungs every 6 (six) hours as needed for wheezing or shortness of breath. 1 each 1     I have reviewed  patient's Past Medical Hx, Surgical Hx, Family Hx, Social Hx, medications and allergies.   ROS:  Review of Systems  Constitutional:  Negative for fever.  Gastrointestinal:  Positive for abdominal pain, diarrhea, nausea and vomiting. Negative for constipation.  Genitourinary:  Negative for dysuria and frequency.  Musculoskeletal:  Negative for myalgias.  Neurological:  Negative for headaches.  Other systems negative  Physical Exam  Patient Vitals for the past 24 hrs:  BP Temp Pulse Resp SpO2 Height Weight  10/16/21 2238 (!) 120/54 98.4 F (36.9 C) (!) 115 16 99 % 5\' 2"  (1.575 m) 62.1 kg   Constitutional: Well-developed, well-nourished female in no acute distress.  Cardiovascular: normal rate and rhythm Respiratory: normal effort, clear to auscultation bilaterally GI: Abd soft, very tender over right lower and right middle abdomen, gravid appropriate for gestational age.   There is  guarding. MS: Extremities nontender, no edema, normal ROM Neurologic: Alert and oriented x 4.  GU: Neg CVAT.   FHT:  Baseline 130 , moderate variability, accelerations present, no decelerations Contractions:  Irregular    Labs: Results for orders placed or performed during the hospital encounter of 10/16/21 (from the past 24 hour(s))  Urinalysis, Routine w reflex microscopic Urine, Clean Catch     Status: Abnormal   Collection Time: 10/16/21 10:53 PM  Result Value Ref Range   Color, Urine YELLOW YELLOW   APPearance HAZY (A) CLEAR   Specific Gravity, Urine 1.021 1.005 - 1.030   pH 5.0 5.0 - 8.0   Glucose, UA NEGATIVE NEGATIVE mg/dL   Hgb urine dipstick NEGATIVE NEGATIVE   Bilirubin Urine NEGATIVE NEGATIVE   Ketones, ur 80 (A) NEGATIVE mg/dL   Protein, ur 30 (A) NEGATIVE mg/dL   Nitrite NEGATIVE NEGATIVE   Leukocytes,Ua TRACE (A) NEGATIVE   RBC / HPF 0-5 0 - 5 RBC/hpf   WBC, UA 0-5 0 - 5 WBC/hpf   Bacteria, UA RARE (A) NONE SEEN   Squamous Epithelial / LPF 0-5 0 - 5   Mucus PRESENT   CBC with  Differential/Platelet     Status: Abnormal   Collection Time: 10/16/21 11:26 PM  Result Value Ref Range   WBC 18.7 (H) 4.0 - 10.5 K/uL   RBC 3.45 (L) 3.87 - 5.11 MIL/uL   Hemoglobin 9.9 (L) 12.0 - 15.0 g/dL   HCT 67.2 (L) 09.4 - 70.9 %   MCV 88.4 80.0 - 100.0 fL   MCH 28.7 26.0 - 34.0 pg   MCHC 32.5 30.0 - 36.0 g/dL   RDW 62.8 36.6 - 29.4 %   Platelets 261 150 - 400 K/uL   nRBC 0.0 0.0 - 0.2 %  Neutrophils Relative % 87 %   Neutro Abs 16.2 (H) 1.7 - 7.7 K/uL   Lymphocytes Relative 9 %   Lymphs Abs 1.7 0.7 - 4.0 K/uL   Monocytes Relative 4 %   Monocytes Absolute 0.7 0.1 - 1.0 K/uL   Eosinophils Relative 0 %   Eosinophils Absolute 0.0 0.0 - 0.5 K/uL   Basophils Relative 0 %   Basophils Absolute 0.0 0.0 - 0.1 K/uL   Immature Granulocytes 0 %   Abs Immature Granulocytes 0.07 0.00 - 0.07 K/uL  Comprehensive metabolic panel     Status: Abnormal   Collection Time: 10/16/21 11:26 PM  Result Value Ref Range   Sodium 131 (L) 135 - 145 mmol/L   Potassium 3.8 3.5 - 5.1 mmol/L   Chloride 101 98 - 111 mmol/L   CO2 20 (L) 22 - 32 mmol/L   Glucose, Bld 127 (H) 70 - 99 mg/dL   BUN 10 6 - 20 mg/dL   Creatinine, Ser 6.83 0.44 - 1.00 mg/dL   Calcium 8.7 (L) 8.9 - 10.3 mg/dL   Total Protein 6.2 (L) 6.5 - 8.1 g/dL   Albumin 2.7 (L) 3.5 - 5.0 g/dL   AST 23 15 - 41 U/L   ALT 15 0 - 44 U/L   Alkaline Phosphatase 110 38 - 126 U/L   Total Bilirubin 0.6 0.3 - 1.2 mg/dL   GFR, Estimated >41 >96 mL/min   Anion gap 10 5 - 15    O/Positive/-- (08/24 1619)  Imaging:    MAU Course/MDM: I have ordered labs and reviewed results. There is leukocytosis NST reviewed, reassuring Given significant tenderness with guarding and leukocytosis, will order MRI to assess for appendicits  Treatments in MAU included IV hydration, analgesics, antiemetic.   Consult Dr Crissie Reese with presentation, exam findings and test results.   Assessment: Single IUP at [redacted]w[redacted]d Right abdominal pain Nausea, vomiting and  diarrhea  Plan: Report given to Dr Crissie Reese  Wynelle Bourgeois CNM, MSN Certified Nurse-Midwife 10/16/2021 10:52 PM   Addendum: Jeanene Erb by radiology, prelim read immediately evident that patient has acute appendicitis, no evidence of frank rupture but concerning features suggesting it may be impending.   I ordered zosyn per pharmacy protocol as well as PRN dilaudid for pain control and a fluid bolus.   I paged General Surgery who called me back immediately and will come to evaluate the patient. Per our discussion surgical management still a possibility despite advanced gestational age but will need to evaluate her first.  I went and informed patient of the result. She is due for IOL at 37 weeks due to IUGR fetus. Discussed that management will depend on evaluation in next few hours by Gen Surg and [facility] Second Attending.  Patient to be admitted.   8:51 AM 10/17/21 Venora Maples, MD/MPH Attending Family Medicine Physician, [facility] for [facility], [facility]

## 2021-10-16 NOTE — MAU Note (Signed)
Pt reports nausea, vomiting , diarrhea, and cramping since this am. States pain is in her right side, right lower back, and lower abd. Pt reports positive fetal movement.

## 2021-10-16 NOTE — Telephone Encounter (Signed)
Preadmission screen  

## 2021-10-17 ENCOUNTER — Other Ambulatory Visit: Payer: Self-pay

## 2021-10-17 ENCOUNTER — Other Ambulatory Visit: Payer: Medicaid Other

## 2021-10-17 ENCOUNTER — Inpatient Hospital Stay (HOSPITAL_COMMUNITY): Payer: Medicaid Other | Admitting: Anesthesiology

## 2021-10-17 ENCOUNTER — Inpatient Hospital Stay (HOSPITAL_COMMUNITY): Payer: Medicaid Other

## 2021-10-17 ENCOUNTER — Encounter (HOSPITAL_COMMUNITY)
Admission: AD | Disposition: A | Discharge status: Home / Self Care | Payer: Self-pay | Source: Home / Self Care | Attending: Obstetrics & Gynecology

## 2021-10-17 ENCOUNTER — Encounter (HOSPITAL_COMMUNITY): Payer: Self-pay | Admitting: Obstetrics and Gynecology

## 2021-10-17 DIAGNOSIS — Z20822 Contact with and (suspected) exposure to covid-19: Secondary | ICD-10-CM | POA: Diagnosis present

## 2021-10-17 DIAGNOSIS — O365930 Maternal care for other known or suspected poor fetal growth, third trimester, not applicable or unspecified: Secondary | ICD-10-CM

## 2021-10-17 DIAGNOSIS — O9902 Anemia complicating childbirth: Secondary | ICD-10-CM | POA: Diagnosis not present

## 2021-10-17 DIAGNOSIS — K3580 Unspecified acute appendicitis: Secondary | ICD-10-CM | POA: Diagnosis not present

## 2021-10-17 DIAGNOSIS — Z3A36 36 weeks gestation of pregnancy: Secondary | ICD-10-CM | POA: Diagnosis not present

## 2021-10-17 DIAGNOSIS — D62 Acute posthemorrhagic anemia: Secondary | ICD-10-CM | POA: Diagnosis not present

## 2021-10-17 DIAGNOSIS — O99613 Diseases of the digestive system complicating pregnancy, third trimester: Secondary | ICD-10-CM | POA: Diagnosis not present

## 2021-10-17 DIAGNOSIS — O99619 Diseases of the digestive system complicating pregnancy, unspecified trimester: Secondary | ICD-10-CM | POA: Diagnosis not present

## 2021-10-17 DIAGNOSIS — O894 Spinal and epidural anesthesia-induced headache during the puerperium: Secondary | ICD-10-CM | POA: Diagnosis not present

## 2021-10-17 DIAGNOSIS — O9081 Anemia of the puerperium: Secondary | ICD-10-CM | POA: Diagnosis not present

## 2021-10-17 DIAGNOSIS — R1031 Right lower quadrant pain: Secondary | ICD-10-CM | POA: Diagnosis present

## 2021-10-17 DIAGNOSIS — O9962 Diseases of the digestive system complicating childbirth: Secondary | ICD-10-CM | POA: Diagnosis present

## 2021-10-17 DIAGNOSIS — Z3A37 37 weeks gestation of pregnancy: Secondary | ICD-10-CM | POA: Diagnosis not present

## 2021-10-17 DIAGNOSIS — D649 Anemia, unspecified: Secondary | ICD-10-CM | POA: Diagnosis not present

## 2021-10-17 DIAGNOSIS — Z880 Allergy status to penicillin: Secondary | ICD-10-CM | POA: Diagnosis not present

## 2021-10-17 HISTORY — PX: LAPAROSCOPIC APPENDECTOMY: SHX408

## 2021-10-17 LAB — CBC
HCT: 30.4 % — ABNORMAL LOW (ref 36.0–46.0)
Hemoglobin: 10.1 g/dL — ABNORMAL LOW (ref 12.0–15.0)
MCH: 29.4 pg (ref 26.0–34.0)
MCHC: 33.2 g/dL (ref 30.0–36.0)
MCV: 88.4 fL (ref 80.0–100.0)
Platelets: 260 10*3/uL (ref 150–400)
RBC: 3.44 MIL/uL — ABNORMAL LOW (ref 3.87–5.11)
RDW: 12.7 % (ref 11.5–15.5)
WBC: 19.5 10*3/uL — ABNORMAL HIGH (ref 4.0–10.5)
nRBC: 0 % (ref 0.0–0.2)

## 2021-10-17 LAB — TYPE AND SCREEN
ABO/RH(D): O POS
Antibody Screen: NEGATIVE

## 2021-10-17 LAB — COMPREHENSIVE METABOLIC PANEL
ALT: 15 U/L (ref 0–44)
AST: 23 U/L (ref 15–41)
Albumin: 2.7 g/dL — ABNORMAL LOW (ref 3.5–5.0)
Alkaline Phosphatase: 110 U/L (ref 38–126)
Anion gap: 10 (ref 5–15)
BUN: 10 mg/dL (ref 6–20)
CO2: 20 mmol/L — ABNORMAL LOW (ref 22–32)
Calcium: 8.7 mg/dL — ABNORMAL LOW (ref 8.9–10.3)
Chloride: 101 mmol/L (ref 98–111)
Creatinine, Ser: 0.71 mg/dL (ref 0.44–1.00)
GFR, Estimated: >60 mL/min (ref >60)
Glucose, Bld: 127 mg/dL — ABNORMAL HIGH (ref 70–99)
Potassium: 3.8 mmol/L (ref 3.5–5.1)
Sodium: 131 mmol/L — ABNORMAL LOW (ref 135–145)
Total Bilirubin: 0.6 mg/dL (ref 0.3–1.2)
Total Protein: 6.2 g/dL — ABNORMAL LOW (ref 6.5–8.1)

## 2021-10-17 LAB — STREP GP B NAA+RFLX: Strep Gp B NAA+Rflx: NEGATIVE

## 2021-10-17 LAB — SARS CORONAVIRUS 2 BY RT PCR (HOSPITAL ORDER, PERFORMED IN ~~LOC~~ HOSPITAL LAB): SARS Coronavirus 2: NEGATIVE

## 2021-10-17 SURGERY — APPENDECTOMY, LAPAROSCOPIC
Anesthesia: General | Site: Abdomen

## 2021-10-17 MED ORDER — FENTANYL CITRATE (PF) 250 MCG/5ML IJ SOLN
INTRAMUSCULAR | Status: AC
  Filled 2021-10-17: qty 5

## 2021-10-17 MED ORDER — SODIUM CHLORIDE 0.9 % IR SOLN
Status: DC | PRN
  Administered 2021-10-17: 1000 mL

## 2021-10-17 MED ORDER — SUGAMMADEX SODIUM 200 MG/2ML IV SOLN
INTRAVENOUS | Status: DC | PRN
Start: 2021-10-17 — End: 2021-10-17
  Administered 2021-10-17: 200 mg via INTRAVENOUS

## 2021-10-17 MED ORDER — HYDROMORPHONE HCL 1 MG/ML IJ SOLN
INTRAMUSCULAR | Status: AC
  Administered 2021-10-17: 1 mg via INTRAVENOUS
  Filled 2021-10-17: qty 1

## 2021-10-17 MED ORDER — ALBUTEROL SULFATE (2.5 MG/3ML) 0.083% IN NEBU: 3.0000 mL | INHALATION_SOLUTION | Freq: Four times a day (QID) | RESPIRATORY_TRACT | Status: DC | PRN

## 2021-10-17 MED ORDER — LACTATED RINGERS IV SOLN: INTRAVENOUS | Status: DC

## 2021-10-17 MED ORDER — ROCURONIUM BROMIDE 10 MG/ML (PF) SYRINGE
PREFILLED_SYRINGE | INTRAVENOUS | Status: DC | PRN
  Administered 2021-10-17: 40 mg via INTRAVENOUS

## 2021-10-17 MED ORDER — CHLORHEXIDINE GLUCONATE 0.12 % MT SOLN: 15.0000 mL | Freq: Once | OROMUCOSAL | Status: AC

## 2021-10-17 MED ORDER — CHLORHEXIDINE GLUCONATE 0.12 % MT SOLN
OROMUCOSAL | Status: AC
  Administered 2021-10-17: 15 mL via OROMUCOSAL
  Filled 2021-10-17: qty 15

## 2021-10-17 MED ORDER — SODIUM CHLORIDE 0.9% FLUSH: 3.0000 mL | INTRAVENOUS | Status: DC | PRN

## 2021-10-17 MED ORDER — ONDANSETRON HCL 4 MG/2ML IJ SOLN
INTRAMUSCULAR | Status: DC | PRN
  Administered 2021-10-17: 4 mg via INTRAVENOUS

## 2021-10-17 MED ORDER — ZOLPIDEM TARTRATE 5 MG PO TABS: 5.0000 mg | ORAL_TABLET | Freq: Every evening | ORAL | Status: DC | PRN

## 2021-10-17 MED ORDER — HYDROMORPHONE HCL 1 MG/ML IJ SOLN
1.0000 mg | INTRAMUSCULAR | Status: DC | PRN
Start: 2021-10-17 — End: 2021-10-19
  Administered 2021-10-17 – 2021-10-18 (×3): 1 mg via INTRAVENOUS
  Filled 2021-10-17 (×3): qty 1

## 2021-10-17 MED ORDER — PIPERACILLIN-TAZOBACTAM 3.375 G IVPB
3.3750 g | Freq: Three times a day (TID) | INTRAVENOUS | Status: DC
  Administered 2021-10-17: 3.375 g via INTRAVENOUS
  Filled 2021-10-17 (×3): qty 50

## 2021-10-17 MED ORDER — PHENYLEPHRINE 40 MCG/ML (10ML) SYRINGE FOR IV PUSH (FOR BLOOD PRESSURE SUPPORT)
PREFILLED_SYRINGE | INTRAVENOUS | Status: DC | PRN
  Administered 2021-10-17 (×2): 80 ug via INTRAVENOUS
  Administered 2021-10-17: 40 ug via INTRAVENOUS
  Administered 2021-10-17: 80 ug via INTRAVENOUS
  Administered 2021-10-17: 40 ug via INTRAVENOUS

## 2021-10-17 MED ORDER — DOCUSATE SODIUM 100 MG PO CAPS
100.0000 mg | ORAL_CAPSULE | Freq: Every day | ORAL | Status: DC
  Administered 2021-10-17 – 2021-10-18 (×2): 100 mg via ORAL
  Filled 2021-10-17 (×2): qty 1

## 2021-10-17 MED ORDER — ORAL CARE MOUTH RINSE: 15.0000 mL | Freq: Once | OROMUCOSAL | Status: AC

## 2021-10-17 MED ORDER — PROPOFOL 10 MG/ML IV BOLUS
INTRAVENOUS | Status: DC | PRN
  Administered 2021-10-17: 150 mg via INTRAVENOUS

## 2021-10-17 MED ORDER — PHENYLEPHRINE HCL-NACL 20-0.9 MG/250ML-% IV SOLN
INTRAVENOUS | Status: DC | PRN
  Administered 2021-10-17: 25 ug/min via INTRAVENOUS

## 2021-10-17 MED ORDER — SODIUM CHLORIDE 0.9 % IV SOLN: INTRAVENOUS | Status: DC

## 2021-10-17 MED ORDER — BUPIVACAINE-EPINEPHRINE 0.25% -1:200000 IJ SOLN
INTRAMUSCULAR | Status: DC | PRN
Start: 2021-10-17 — End: 2021-10-17
  Administered 2021-10-17: 10 mL

## 2021-10-17 MED ORDER — LACTATED RINGERS IV BOLUS: 1000.0000 mL | Freq: Once | INTRAVENOUS | Status: DC

## 2021-10-17 MED ORDER — CALCIUM CARBONATE ANTACID 500 MG PO CHEW: 2.0000 | CHEWABLE_TABLET | ORAL | Status: DC | PRN

## 2021-10-17 MED ORDER — ACETAMINOPHEN 500 MG PO TABS
ORAL_TABLET | ORAL | Status: AC
  Filled 2021-10-17: qty 2

## 2021-10-17 MED ORDER — ACETAMINOPHEN 325 MG PO TABS
650.0000 mg | ORAL_TABLET | Freq: Four times a day (QID) | ORAL | Status: DC | PRN
  Administered 2021-10-18 (×2): 650 mg via ORAL
  Filled 2021-10-17 (×2): qty 2

## 2021-10-17 MED ORDER — HYDROCODONE-ACETAMINOPHEN 5-325 MG PO TABS
1.0000 | ORAL_TABLET | ORAL | Status: DC | PRN
  Administered 2021-10-17 – 2021-10-18 (×3): 1 via ORAL
  Filled 2021-10-17 (×3): qty 1

## 2021-10-17 MED ORDER — FENTANYL CITRATE (PF) 250 MCG/5ML IJ SOLN
INTRAMUSCULAR | Status: DC | PRN
  Administered 2021-10-17 (×3): 50 ug via INTRAVENOUS

## 2021-10-17 MED ORDER — BUPIVACAINE-EPINEPHRINE (PF) 0.25% -1:200000 IJ SOLN
INTRAMUSCULAR | Status: AC
  Filled 2021-10-17: qty 30

## 2021-10-17 MED ORDER — ACETAMINOPHEN 325 MG PO TABS: 650.0000 mg | ORAL_TABLET | ORAL | Status: DC | PRN

## 2021-10-17 MED ORDER — SCOPOLAMINE 1 MG/3DAYS TD PT72: 1.0000 | MEDICATED_PATCH | TRANSDERMAL | Status: DC

## 2021-10-17 MED ORDER — LIDOCAINE 2% (20 MG/ML) 5 ML SYRINGE
INTRAMUSCULAR | Status: DC | PRN
  Administered 2021-10-17: 60 mg via INTRAVENOUS

## 2021-10-17 MED ORDER — SODIUM CHLORIDE 0.9% FLUSH
3.0000 mL | Freq: Two times a day (BID) | INTRAVENOUS | Status: DC
  Administered 2021-10-17 – 2021-10-18 (×3): 3 mL via INTRAVENOUS

## 2021-10-17 MED ORDER — SCOPOLAMINE 1 MG/3DAYS TD PT72
MEDICATED_PATCH | TRANSDERMAL | Status: AC
  Administered 2021-10-17: 1.5 mg via TRANSDERMAL
  Filled 2021-10-17: qty 1

## 2021-10-17 MED ORDER — OXYCODONE HCL 5 MG PO TABS
5.0000 mg | ORAL_TABLET | ORAL | Status: DC | PRN
  Filled 2021-10-17: qty 1

## 2021-10-17 MED ORDER — ACETAMINOPHEN 500 MG PO TABS
1000.0000 mg | ORAL_TABLET | Freq: Once | ORAL | Status: AC
  Administered 2021-10-17: 1000 mg via ORAL

## 2021-10-17 MED ORDER — HYDROMORPHONE HCL 1 MG/ML IJ SOLN: 1.0000 mg | Freq: Once | INTRAMUSCULAR | Status: AC

## 2021-10-17 MED ORDER — SUCCINYLCHOLINE CHLORIDE 200 MG/10ML IV SOSY
PREFILLED_SYRINGE | INTRAVENOUS | Status: DC | PRN
Start: 2021-10-17 — End: 2021-10-17
  Administered 2021-10-17: 140 mg via INTRAVENOUS

## 2021-10-17 MED ORDER — 0.9 % SODIUM CHLORIDE (POUR BTL) OPTIME
TOPICAL | Status: DC | PRN
  Administered 2021-10-17: 1000 mL

## 2021-10-17 MED ORDER — DEXAMETHASONE SODIUM PHOSPHATE 10 MG/ML IJ SOLN
INTRAMUSCULAR | Status: DC | PRN
  Administered 2021-10-17: 5 mg via INTRAVENOUS

## 2021-10-17 MED ORDER — SODIUM CHLORIDE 0.9 % IV SOLN: 250.0000 mL | INTRAVENOUS | Status: DC | PRN

## 2021-10-17 MED ORDER — PRENATAL MULTIVITAMIN CH
1.0000 | ORAL_TABLET | Freq: Every day | ORAL | Status: DC
  Administered 2021-10-17 – 2021-10-18 (×2): 1 via ORAL
  Filled 2021-10-17 (×2): qty 1

## 2021-10-17 SURGICAL SUPPLY — 45 items
APPLIER CLIP 5 13 M/L LIGAMAX5 (MISCELLANEOUS)
BAG COUNTER SPONGE SURGICOUNT (BAG) ×2 IMPLANT
BLADE CLIPPER SURG (BLADE) IMPLANT
CANISTER SUCT 3000ML PPV (MISCELLANEOUS) ×2 IMPLANT
CHLORAPREP W/TINT 26 (MISCELLANEOUS) ×2 IMPLANT
CLIP APPLIE 5 13 M/L LIGAMAX5 (MISCELLANEOUS) IMPLANT
COVER SURGICAL LIGHT HANDLE (MISCELLANEOUS) ×2 IMPLANT
CUTTER ECHEON FLEX ENDO 45 340 (ENDOMECHANICALS) ×1 IMPLANT
CUTTER FLEX LINEAR 45M (STAPLE) ×1 IMPLANT
DERMABOND ADVANCED (GAUZE/BANDAGES/DRESSINGS) ×1
DERMABOND ADVANCED .7 DNX12 (GAUZE/BANDAGES/DRESSINGS) ×1 IMPLANT
ELECT REM PT RETURN 9FT ADLT (ELECTROSURGICAL) ×2
ELECTRODE REM PT RTRN 9FT ADLT (ELECTROSURGICAL) ×1 IMPLANT
GLOVE SURG ENC MOIS LTX SZ7.5 (GLOVE) ×2 IMPLANT
GLOVE SURG UNDER LTX SZ8 (GLOVE) ×2 IMPLANT
GOWN STRL REUS W/ TWL LRG LVL3 (GOWN DISPOSABLE) ×2 IMPLANT
GOWN STRL REUS W/ TWL XL LVL3 (GOWN DISPOSABLE) ×1 IMPLANT
GOWN STRL REUS W/TWL LRG LVL3 (GOWN DISPOSABLE) ×2
GOWN STRL REUS W/TWL XL LVL3 (GOWN DISPOSABLE) ×1
KIT BASIN OR (CUSTOM PROCEDURE TRAY) ×2 IMPLANT
KIT TURNOVER KIT B (KITS) ×2 IMPLANT
NS IRRIG 1000ML POUR BTL (IV SOLUTION) ×2 IMPLANT
PAD ARMBOARD 7.5X6 YLW CONV (MISCELLANEOUS) ×4 IMPLANT
PENCIL SMOKE EVACUATOR (MISCELLANEOUS) ×2 IMPLANT
POUCH SPECIMEN RETRIEVAL 10MM (ENDOMECHANICALS) ×2 IMPLANT
RELOAD 45 VASCULAR/THIN (ENDOMECHANICALS) IMPLANT
RELOAD STAPLE 45 2.5 WHT GRN (ENDOMECHANICALS) IMPLANT
RELOAD STAPLE 45 3.5 BLU ETS (ENDOMECHANICALS) IMPLANT
RELOAD STAPLE 45 3.6 BLU REG (STAPLE) IMPLANT
RELOAD STAPLE TA45 3.5 REG BLU (ENDOMECHANICALS) IMPLANT
SCISSORS LAP 5X35 DISP (ENDOMECHANICALS) IMPLANT
SET IRRIG TUBING LAPAROSCOPIC (IRRIGATION / IRRIGATOR) ×2 IMPLANT
SET TUBE SMOKE EVAC HIGH FLOW (TUBING) ×2 IMPLANT
SHEARS HARMONIC ACE PLUS 36CM (ENDOMECHANICALS) ×2 IMPLANT
SPECIMEN JAR SMALL (MISCELLANEOUS) ×2 IMPLANT
STAPLE RELOAD 45MM BLUE (STAPLE) ×2 IMPLANT
SUT MNCRL AB 4-0 PS2 18 (SUTURE) ×3 IMPLANT
TOWEL GREEN STERILE (TOWEL DISPOSABLE) ×2 IMPLANT
TOWEL GREEN STERILE FF (TOWEL DISPOSABLE) ×2 IMPLANT
TRAY FOLEY W/BAG SLVR 16FR (SET/KITS/TRAYS/PACK) ×1
TRAY FOLEY W/BAG SLVR 16FR ST (SET/KITS/TRAYS/PACK) ×1 IMPLANT
TRAY LAPAROSCOPIC MC (CUSTOM PROCEDURE TRAY) ×2 IMPLANT
TROCAR ADV FIXATION 5X100MM (TROCAR) ×4 IMPLANT
TROCAR XCEL BLUNT TIP 100MML (ENDOMECHANICALS) ×2 IMPLANT
WATER STERILE IRR 1000ML POUR (IV SOLUTION) ×2 IMPLANT

## 2021-10-17 NOTE — Progress Notes (Signed)
Foley catheter discontinued. 250cc clear yellow urine emptied.

## 2021-10-17 NOTE — MAU Note (Signed)
Pt transported to MRI 

## 2021-10-17 NOTE — Anesthesia Preprocedure Evaluation (Addendum)
Anesthesia Evaluation  Patient identified by MRN, date of birth, ID band Patient awake    Reviewed: Allergy & Precautions, NPO status , Patient's Chart, lab work & pertinent test results  Airway Mallampati: II  TM Distance: >3 FB Neck ROM: Full    Dental no notable dental hx.    Pulmonary neg pulmonary ROS, Patient abstained from smoking.,    Pulmonary exam normal breath sounds clear to auscultation       Cardiovascular Exercise Tolerance: Good Normal cardiovascular exam+ dysrhythmias (palpitations)  Rhythm:Regular Rate:Normal     Neuro/Psych  Headaches, PSYCHIATRIC DISORDERS (ODD) Anxiety Depression    GI/Hepatic negative GI ROS, (+)     substance abuse  marijuana use,   Endo/Other  Hyperthyroidism   Renal/GU negative Renal ROS  negative genitourinary   Musculoskeletal negative musculoskeletal ROS (+)   Abdominal   Peds negative pediatric ROS (+)  Hematology negative hematology ROS (+)   Anesthesia Other Findings   Reproductive/Obstetrics (+) Pregnancy (36 weeks 5 days)                            Anesthesia Physical Anesthesia Plan  ASA: 2 and emergent  Anesthesia Plan: General   Post-op Pain Management:    Induction: Intravenous and Rapid sequence  PONV Risk Score and Plan: 3 and Scopolamine patch - Pre-op, Treatment may vary due to age or medical condition, Ondansetron and Dexamethasone  Airway Management Planned: Oral ETT  Additional Equipment: None  Intra-op Plan:   Post-operative Plan: Extubation in OR  Informed Consent: I have reviewed the patients History and Physical, chart, labs and discussed the procedure including the risks, benefits and alternatives for the proposed anesthesia with the patient or authorized representative who has indicated his/her understanding and acceptance.       Plan Discussed with: Anesthesiologist and CRNA  Anesthesia Plan Comments:  (GETA. RSI. Existing PIV. Tanna Furry, MD  Being followed by Sheral Apley (see Epic notes))       Anesthesia Quick Evaluation

## 2021-10-17 NOTE — Progress Notes (Signed)
G1P0 at 36 5/7 weeks post op in main PACU from acute appendicitis.  Monitors applied for NST.  Patient is on left lateral.  No bleeding or leaking noted.  Abdomen palpates soft and nontender.

## 2021-10-17 NOTE — Op Note (Signed)
Sandy Carter [id number]   PRE-OPERATIVE DIAGNOSIS:  Acute appendicitis  POST-OPERATIVE DIAGNOSIS:  Acute suppurative appendicitis without perforation or abscess  Procedure(s): APPENDECTOMY LAPAROSCOPIC  PROCEDURE: Laparoscopic appendectomy  SURGEON:  Stephanie Coup. Jamerius Boeckman, M.D.  ASSISTANT: Carl Best [facility]  ANESTHESIA: General endotracheal  EBL:   5 mL  DRAINS: None  SPECIMEN:  Appendix  COUNTS:  Sponge, needle and instrument counts were reported correct x2 at conclusion of the operation  DISPOSITION:  PACU in satisfactory condition  COMPLICATIONS: None  FINDINGS: Acutely inflamed appendix with separation but no perforation.  No evident abscess.  Fetal heart tones were checked with Dr. Shawnie Pons prior to skin incision and at the completion of the case and were noted normal.  DESCRIPTION:   The patient was identified & brought into the operating room. SCDs were in place and functioning.  She was positioned with a bump under her right side.  General endotracheal anesthesia was administered. Preoperative antibiotics were administered. The patient was positioned supine with left arm tucked. Hair on the abdomen was then clipped by the OR team. A foley catheter was inserted under sterile conditions. The abdomen was prepped and draped in the standard sterile fashion. A surgical timeout confirmed our plan.  Doppler was used with Dr. Shawnie Pons at bedside and confirmed appropriate fetal heart tones. Uterus is palpated and noted to be approximately halfway between the umbilicus and xiphoid.  Cephalad to this location, with an OG in place by anesthesia, a skin incision is created.  The rectus fascia is identified and elevated.  This is incised.  The peritoneum was then cautiously opened.  Digital inspection reveals peritoneal entry and we are cephalad to the uterus.  A 0 Vicryl purse-string suture was placed and then the [facility] port was introduced into the abdomen.  CO2 insufflation commenced to  . The laparoscope was inserted and confirmed no evidence of trocar site complications. She was then positioned in Trendelenburg. Two additional ports were placed under direct visualization with more than adequate space between her relatively small uterus and abdominal wall.  These were placed approximately in the umbilical and just to the right of umbilical positions.  The bed was then slightly tilted to place the left side down.  The terminal ileum was identified and normal.  The appendix is well identified in the right lower quadrant.  It is fibrotic and acutely inflamed in appearance with separation.  There is no evidence of abscess or perforation.  No frank gangrene.  The appendix was elevated.  The base of the appendix was circumferentially dissected taking care to preserve the cecum free of injury. The base was noted to be viable and healthy appearing. The terminal ileum, cecum and ascending colon also appeared normal. The base of the appendix was then stapled with a blue load, taking a small healthy cuff of viable cecum, taking care to stay clear of the ileocecal valve. The mesoappendix was then ligated by "hugging" the appendix using the harmonic scalpel. The mesoappendix was inspected and noted to be hemostatic. The appendix was placed in an EndoBag.  The right lower quadrant was conservatively irrigated. Hemostasis was noted to be achieved - taking time to inspect the ligated mesoappendix, colon mesentery, and retroperitoneum. Staple line was noted to be intact on the cecum with no bleeding. There was no perforation or injury. The right lower quadrant appeared clean and as such, no drain was placed.  The two 5 mm ports were removed under direct visualization. The EndoBag was then removed through  the umbilical port site and passed off as specimen. The CO2 was exhausted from the abdomen. The umbilical fascia was then closed by closing the 0 Vicryl suture. The fascia was palpated and noted to be  completely closed.  Fetal Doppler was used with Dr. Shawnie Pons at bedside, confirming adequate fetal heart tones/rate.  The skin of all port sites was then approximated using 4-0 Monocryl suture. The incisions were covered with Dermabond.  She was then awakened from general anesthesia, extubated, and transferred to a stretcher for transport to recover in satisfactory condition.  Plans were made by [facility] service for monitoring in the recovery room.

## 2021-10-17 NOTE — Consult Note (Addendum)
Sandy Carter 09-Aug-2001  [id number].    Requesting MD: Dr. Elgie Congo Chief Complaint/Reason for Consult: appendicitis in pregnancy  HPI:  Sandy Carter is a 21 y/o F who is [redacted]w[redacted]d pregnant who presented with 24 hours of abdominal pain. Patient reports abdominal cramping yesterday morning before work. State she got to work and tried to eat some breakfast and her sxs did not improve. She developed severe pain, nausea, diarrhea and vomiting. Has been unable to keep food down. She is a former smoker who quit about 2 months ago. Also reports a history of hyperthyroidism and seizure disorder. Denies seeing a neurologist for seizures, denies daily meds for this but states they are more frequent than they used to be- last one being a few months ago during her pregnancy. Patients pregnancy is complicated by IUGR and patient is scheduled for induction tomorrow 2/10. Denies history of abd surgery. Denies use of blood thinners.  Afebrile, WBC 18 (was 11, 9 days ago), MRI pelvis concerning for acute appendicitis   ROS: Review of Systems  Constitutional:  Positive for malaise/fatigue.  Eyes: Negative.   Respiratory: Negative.    Cardiovascular: Negative.   Gastrointestinal:  Positive for abdominal pain, diarrhea, nausea and vomiting.  Genitourinary: Negative.   Musculoskeletal: Negative.   Skin: Negative.   Neurological:  Positive for seizures.  Endo/Heme/Allergies: Negative.   Psychiatric/Behavioral: Negative.     Family History  Adopted: Yes  Problem Relation Age of Onset   Alcoholism Mother    Bone cancer Mother    Diabetes Maternal Grandmother     Past Medical History:  Diagnosis Date   ADD (attention deficit disorder)     in her teens   Anxiety    Depression    Dysrhythmia    palpitations - from Hyperthyroidism   Headache    Hyperthyroidism    not treated at this time   Lumbar herniated disc    ODD (oppositional defiant disorder)    in her teens   Seizures ([place])    shakes  alot, is aware of what is going on, feels them coming on, has not seen a neurologist   Tuberculosis    before age 21    Past Surgical History:  Procedure Laterality Date   COLONOSCOPY WITH PROPOFOL N/A 09/17/2020   Procedure: COLONOSCOPY WITH PROPOFOL;  Surgeon: Eloise Harman, DO;  [facility];  Service: Endoscopy;  Laterality: N/A;  2:00pm   LUMBAR LAMINECTOMY/DECOMPRESSION MICRODISCECTOMY Right 12/28/2020   Procedure: Right Lumbar Five Sacral One Microdiscectomy;  Surgeon: Ashok Pall, MD;  Location: [facility];  Service: Neurosurgery;  Laterality: Right;   WISDOM TOOTH EXTRACTION      Social History:  reports that she has never smoked. She has never used smokeless tobacco. She reports that she does not currently use alcohol. She reports that she does not currently use drugs after having used the following drugs: Marijuana.  Allergies:  Allergies  Allergen Reactions   Penicillins Rash    Reaction: 5 years   Penicillin G Other (See Comments)    Medications Prior to Admission  Medication Sig Dispense Refill   Prenatal Vit-Fe Fumarate-FA (PRENATAL VITAMIN PO) Take by mouth.     Acetaminophen (TYLENOL PO) Take by mouth as needed.     Blood Pressure Monitor MISC For regular home bp monitoring during pregnancy (Patient not taking: Reported on 10/15/2021) 1 each 0   hydrOXYzine (ATARAX/VISTARIL) 50 MG tablet Take 50 mg by mouth every 6 (six) hours as  needed for anxiety or itching (Seizures).     PROAIR HFA 108 (90 Base) MCG/ACT inhaler Inhale 1 puff into the lungs every 6 (six) hours as needed for wheezing or shortness of breath. 1 each 1     Physical Exam: Blood pressure (!) 86/55, pulse (!) 107, temperature 98.4 F (36.9 C), temperature source Oral, resp. rate 16, height 5\' 2"  (1.575 m), weight 62.1 kg, last menstrual period 01/14/2021, SpO2 97 %. General: Pleasant white female laying on hospital bed, appears stated age, NAD. HEENT: head -normocephalic, atraumatic; Eyes:  PERRLA, no conjunctival injection; Ears- no external lesions or tenderness; dentition fair  Neck- Trachea is midline, no JVD appreciated.  CV- RRR, normal S1/S2, no M/R/G, radial and dorsalis pedis pulses 2+ BL, cap refill < 2 seconds; mild BLE edema Pulm- breathing is non-labored. CTABL, no wheezes, rhales, rhonchi. Abd- gravid abdomen; TTP RLQ, there is referred pain from LLQ to RLQ consistent with localized peritonitis, BS difficult to appreciate GU- deferred  MSK- UE/LE symmetrical, MAE spontaneously Neuro- CN II-XII grossly in tact, no paresthesias. Psych- Alert and Oriented x3 with appropriate affect Skin: warm and dry, no rashes or lesions   Results for orders placed or performed during the hospital encounter of 10/16/21 (from the past 48 hour(s))  Urinalysis, Routine w reflex microscopic Urine, Clean Catch     Status: Abnormal   Collection Time: 10/16/21 10:53 PM  Result Value Ref Range   Color, Urine YELLOW YELLOW   APPearance HAZY (A) CLEAR   Specific Gravity, Urine 1.021 1.005 - 1.030   pH 5.0 5.0 - 8.0   Glucose, UA NEGATIVE NEGATIVE mg/dL   Hgb urine dipstick NEGATIVE NEGATIVE   Bilirubin Urine NEGATIVE NEGATIVE   Ketones, ur 80 (A) NEGATIVE mg/dL   Protein, ur 30 (A) NEGATIVE mg/dL   Nitrite NEGATIVE NEGATIVE   Leukocytes,Ua TRACE (A) NEGATIVE   RBC / HPF 0-5 0 - 5 RBC/hpf   WBC, UA 0-5 0 - 5 WBC/hpf   Bacteria, UA RARE (A) NONE SEEN   Squamous Epithelial / LPF 0-5 0 - 5   Mucus PRESENT     Comment: Performed at [facility], [address], [place], [place] [address]  CBC with Differential/Platelet     Status: Abnormal   Collection Time: 10/16/21 11:26 PM  Result Value Ref Range   WBC 18.7 (H) 4.0 - 10.5 K/uL   RBC 3.45 (L) 3.87 - 5.11 MIL/uL   Hemoglobin 9.9 (L) 12.0 - 15.0 g/dL   HCT 30.5 (L) 36.0 - 46.0 %   MCV 88.4 80.0 - 100.0 fL   MCH 28.7 26.0 - 34.0 pg   MCHC 32.5 30.0 - 36.0 g/dL   RDW 12.7 11.5 - 15.5 %   Platelets 261 150 - 400 K/uL   nRBC  0.0 0.0 - 0.2 %   Neutrophils Relative % 87 %   Neutro Abs 16.2 (H) 1.7 - 7.7 K/uL   Lymphocytes Relative 9 %   Lymphs Abs 1.7 0.7 - 4.0 K/uL   Monocytes Relative 4 %   Monocytes Absolute 0.7 0.1 - 1.0 K/uL   Eosinophils Relative 0 %   Eosinophils Absolute 0.0 0.0 - 0.5 K/uL   Basophils Relative 0 %   Basophils Absolute 0.0 0.0 - 0.1 K/uL   Immature Granulocytes 0 %   Abs Immature Granulocytes 0.07 0.00 - 0.07 K/uL    Comment: Performed at [facility], [place] [address], [place], [place] [address]  Comprehensive metabolic panel  Status: Abnormal   Collection Time: 10/16/21 11:26 PM  Result Value Ref Range   Sodium 131 (L) 135 - 145 mmol/L   Potassium 3.8 3.5 - 5.1 mmol/L   Chloride 101 98 - 111 mmol/L   CO2 20 (L) 22 - 32 mmol/L   Glucose, Bld 127 (H) 70 - 99 mg/dL    Comment: Glucose reference range applies only to samples taken after fasting for at least 8 hours.   BUN 10 6 - 20 mg/dL   Creatinine, Ser 0.71 0.44 - 1.00 mg/dL   Calcium 8.7 (L) 8.9 - 10.3 mg/dL   Total Protein 6.2 (L) 6.5 - 8.1 g/dL   Albumin 2.7 (L) 3.5 - 5.0 g/dL   AST 23 15 - 41 U/L   ALT 15 0 - 44 U/L   Alkaline Phosphatase 110 38 - 126 U/L   Total Bilirubin 0.6 0.3 - 1.2 mg/dL   GFR, Estimated >60 >60 mL/min    Comment: (NOTE) Calculated using the CKD-EPI Creatinine Equation (2021)    Anion gap 10 5 - 15    Comment: Performed at [facility], [place] [address], [place], [place] [address]   MR PELVIS WO CONTRAST  Result Date: 10/17/2021 CLINICAL DATA:  Acute abdominal pain of the RIGHT lower quadrant in a 21 year old female at 19 weeks of pregnancy. EXAM: MRI ABDOMEN AND PELVIS WITHOUT CONTRAST TECHNIQUE: Multiplanar multisequence MR imaging of the abdomen and pelvis was performed. No intravenous contrast was administered. COMPARISON:  None FINDINGS: COMBINED FINDINGS FOR BOTH MR ABDOMEN AND PELVIS Lower chest: Incidental imaging of the lung bases is unremarkable by MRI with limited assessment.  Hepatobiliary: No biliary duct dilation. No pericholecystic stranding, sludge in the gallbladder. Limited assessment of the liver is unremarkable. Pancreas: Pancreas with normal intrinsic T1 signal. No signs of peripancreatic inflammation. Spleen:  Normal in size and contour.  No focal lesion. Adrenals/Urinary Tract: Adrenal glands are normal. There is mild to moderate RIGHT-sided hydroureteronephrosis in the setting of a gravid uterus with compression upon the distal RIGHT ureter at the level of the sacral promontory, likely physiologic dilation of the upper RIGHT urinary tract in the setting of this late stage of pregnancy. Stomach/Bowel: Blind-ending tubular structure with dilation, internal heterogeneity and marked surrounding edema extending inferiorly from the cecal tip, does not appear to be frankly retrocecal. Trace fluid in the retroperitoneum adjacent to the appendix is nonfocal. Edema tracks along the RIGHT flank. Structure in the RIGHT lower quadrant arises from the cecum. Maximal caliber 13 mm. Vascular/Lymphatic: Vascular structures not well assessed given lack of intravenous contrast. No gross adenopathy in the abdomen or in the pelvis. Reproductive: Gravid uterus. Fetus in vertex position, anterior placenta. Study not performed for fetal evaluation. The ovaries are grossly normal is. Other: Edema tracks along the RIGHT flank in and is present in the RIGHT lower quadrant. Musculoskeletal: No suspicious bone lesions identified. IMPRESSION: 1. Findings of acute appendicitis with signs of marked inflammation about the RIGHT lower quadrant. No definitive signs of rupture though inflammation tracks throughout the RIGHT lower quadrant extending along the RIGHT paracolic gutter and posterior to the cecum and ascending colon, possibility of early or impending rupture is considered for this reason. There is no free fluid in the pelvis. 2. Moderate RIGHT-sided ureteral dilation in the setting of uterine  compression without signs of perinephric stranding, compatible with physiologic dilation of the RIGHT urinary tract. 3. Gravid uterus, feet is in vertex position. Study not performed for fetal evaluation. 4.  Gallbladder sludge. These results were called by telephone at the time of interpretation on 10/17/2021 at 8:31 am to provider Dr. Gaynelle Adu, Who verbally acknowledged these results. Electronically Signed   By: Zetta Bills M.D.   On: 10/17/2021 08:40   MR ABDOMEN WO CONTRAST  Result Date: 10/17/2021 CLINICAL DATA:  Acute abdominal pain of the RIGHT lower quadrant in a 21 year old female at 21 weeks of pregnancy. EXAM: MRI ABDOMEN AND PELVIS WITHOUT CONTRAST TECHNIQUE: Multiplanar multisequence MR imaging of the abdomen and pelvis was performed. No intravenous contrast was administered. COMPARISON:  None FINDINGS: COMBINED FINDINGS FOR BOTH MR ABDOMEN AND PELVIS Lower chest: Incidental imaging of the lung bases is unremarkable by MRI with limited assessment. Hepatobiliary: No biliary duct dilation. No pericholecystic stranding, sludge in the gallbladder. Limited assessment of the liver is unremarkable. Pancreas: Pancreas with normal intrinsic T1 signal. No signs of peripancreatic inflammation. Spleen:  Normal in size and contour.  No focal lesion. Adrenals/Urinary Tract: Adrenal glands are normal. There is mild to moderate RIGHT-sided hydroureteronephrosis in the setting of a gravid uterus with compression upon the distal RIGHT ureter at the level of the sacral promontory, likely physiologic dilation of the upper RIGHT urinary tract in the setting of this late stage of pregnancy. Stomach/Bowel: Blind-ending tubular structure with dilation, internal heterogeneity and marked surrounding edema extending inferiorly from the cecal tip, does not appear to be frankly retrocecal. Trace fluid in the retroperitoneum adjacent to the appendix is nonfocal. Edema tracks along the RIGHT flank. Structure in the RIGHT lower  quadrant arises from the cecum. Maximal caliber 13 mm. Vascular/Lymphatic: Vascular structures not well assessed given lack of intravenous contrast. No gross adenopathy in the abdomen or in the pelvis. Reproductive: Gravid uterus. Fetus in vertex position, anterior placenta. Study not performed for fetal evaluation. The ovaries are grossly normal is. Other: Edema tracks along the RIGHT flank in and is present in the RIGHT lower quadrant. Musculoskeletal: No suspicious bone lesions identified. IMPRESSION: 1. Findings of acute appendicitis with signs of marked inflammation about the RIGHT lower quadrant. No definitive signs of rupture though inflammation tracks throughout the RIGHT lower quadrant extending along the RIGHT paracolic gutter and posterior to the cecum and ascending colon, possibility of early or impending rupture is considered for this reason. There is no free fluid in the pelvis. 2. Moderate RIGHT-sided ureteral dilation in the setting of uterine compression without signs of perinephric stranding, compatible with physiologic dilation of the RIGHT urinary tract. 3. Gravid uterus, feet is in vertex position. Study not performed for fetal evaluation. 4. Gallbladder sludge. These results were called by telephone at the time of interpretation on 10/17/2021 at 8:31 am to provider Dr. Gaynelle Adu, Who verbally acknowledged these results. Electronically Signed   By: Zetta Bills M.D.   On: 10/17/2021 08:40   [place] MFM FETAL BPP WO NON STRESS  Result Date: 10/15/2021 ----------------------------------------------------------------------  OBSTETRICS REPORT                       (Signed Final 10/15/2021 05:15 pm) ---------------------------------------------------------------------- Patient Info  ID #:       [id number]                          D.O.B.:  08/20/2001 (20 yrs)  Name:       Sandy Carter [facility]                Visit Date: 10/15/2021 02:35 pm ----------------------------------------------------------------------  Performed By  Attending:        Sander Nephew      Ref. Address:     [facility]                    MD                                                             OB/GYN                                                             [place]                                                             [place]  Performed By:     Jeanene Erb BS,      Location[facility]         [facility]                    RDMS                                     [facility] at                                                             [place] for                                                             Women  Referred By:      Roma Schanz CNM ---------------------------------------------------------------------- Orders  #  Description                           Code        Ordered By  1  [place] MFM OB DETAIL +14 [place]               76811.01  Wells Guiles  2  [place] MFM FETAL BPP WO NON               76819.01    KIMBERLY BOOKER     STRESS  3  [place] MFM UA CORD DOPPLER                76820.02    [facility] BOOKER ----------------------------------------------------------------------  #  Order #                     Accession #                Episode #  1  [id number]                   [id number]                 [id number]  2  [id number]                   [id number]                 [id number]  3  [id number]                   [id number]                 [id number] ---------------------------------------------------------------------- Indications  Maternal care for known or suspected poor      O36.5930  fetal growth, third trimester, not applicable or  unspecified IUGR  Encounter for antenatal screening for          Z36.3  malformations  Seizure disorder(No Meds)                      O99.350 G40.909  Hyperthyroid (No Meds)                         O99.280 E05.90  [redacted] weeks gestation of pregnancy                Z3A.36  Low Risk NIPS(Negative  Horizon)(Negative  AFP) ---------------------------------------------------------------------- Fetal Evaluation  Num Of Fetuses:         1  Fetal Heart Rate(bpm):  160  Cardiac Activity:       Observed  Presentation:           Cephalic  Placenta:               Anterior  P. Cord Insertion:      Visualized  Amniotic Fluid  AFI FV:      Subjectively low-normal  AFI Sum(cm)     %Tile       Largest Pocket(cm)  7.7             6           3.3  RUQ(cm)       RLQ(cm)       LUQ(cm)        LLQ(cm)  3.3           1.1           2.6            0.8 ---------------------------------------------------------------------- Biophysical Evaluation  Amniotic F.V:   Pocket => 2 cm             F. Tone:        Observed  F. Movement:    Observed  Score:          8/8  F. Breathing:   Observed ---------------------------------------------------------------------- Biometry  BPD:      83.1  mm     G. Age:  33w 3d        2.3  %    CI:        77.32   %    70 - 86                                                          FL/HC:      21.6   %    20.1 - 22.1  HC:      299.2  mm     G. Age:  33w 1d        < 1  %    HC/AC:      1.01        0.93 - 1.11  AC:      294.9  mm     G. Age:  33w 3d        2.5  %    FL/BPD:     77.9   %    71 - 87  FL:       64.7  mm     G. Age:  33w 3d        1.3  %    FL/AC:      21.9   %    20 - 24  HUM:      57.3  mm     G. Age:  33w 2d          9  %  Est. FW:    2189  gm    4 lb 13 oz     2.6  % ---------------------------------------------------------------------- OB History  Gravidity:    1 ---------------------------------------------------------------------- Gestational Age  LMP:           39w 1d        Date:  01/14/21                 EDD:   10/21/21  U/S Today:     33w 3d                                        EDD:   11/30/21  Best:          36w 3d     Det. By:  Loman Chroman         EDD:   11/09/21                                      (03/18/21)  ---------------------------------------------------------------------- Anatomy  Cranium:               Appears normal         Aortic Arch:            Not well visualized  Cavum:                 Appears normal         Ductal Arch:  Not well visualized  Ventricles:            Appears normal         Diaphragm:              Appears normal  Choroid Plexus:        Appears normal         Stomach:                Appears normal, left                                                                        sided  Cerebellum:            Not well visualized    Abdomen:                Appears normal  Posterior Fossa:       Not well visualized    Abdominal Wall:         Not well visualized  Nuchal Fold:           Not applicable ([id number]    Cord Vessels:           Appears normal ([redacted]                         wks GA)                                        vessel cord)  Face:                  Orbits appear          Kidneys:                Appear normal                         normal  Lips:                  Not well visualized    Bladder:                Appears normal  Thoracic:              Appears normal         Spine:                  Not well visualized  Heart:                 Appears normal         Upper Extremities:      Not well visualized                         (4CH, axis, and                         situs)  RVOT:                  Appears normal  Lower Extremities:      Appears normal  LVOT:                  Appears normal  Other:  Fetus appears to be a female. Nasal bone visualized. Technically          difficult due to advanced GA and fetal position. ---------------------------------------------------------------------- Doppler - Fetal Vessels  Umbilical Artery   S/D     %tile      RI    %tile      PI    %tile     PSV    ADFV    RDFV                                                     (cm/s)   2.92       80    0.66       85       1       82    24.11      No      No  ---------------------------------------------------------------------- Impression  Follow up growth due to severe fetal growth restriction.  Normal interval growth with measurements consistent with  FGR 2.3%  Good fetal movement and amniotic fluid volume  Biophysical profile 8/8 was seen  Normal UA dopplers without evidence of AEDF or REDF.  She was counseled regarding continue daily kick counts.  She is scheduled for IOL on Friday. ----------------------------------------------------------------------               Sander Nephew, MD Electronically Signed Final Report   10/15/2021 05:15 pm ----------------------------------------------------------------------  [place] MFM OB DETAIL +14 [place]  Result Date: 10/15/2021 ----------------------------------------------------------------------  OBSTETRICS REPORT                       (Signed Final 10/15/2021 05:15 pm) ---------------------------------------------------------------------- Patient Info  ID #:       [id number]                          D.O.B.:  2001-03-10 (20 yrs)  Name:       LAGRETTA COLLIGAN [facility]                Visit Date: 10/15/2021 02:35 pm ---------------------------------------------------------------------- Performed By  Attending:        Sander Nephew      Ref. Address:     [facility]                    MD                                                             OB/GYN                                                             [address]  [place]                                                             [id number]  Performed By:     Jeanene Erb BS,      Location:         [facility]                    RDMS                                     Fetal Care at                                                             [place] for                                                             Women  Referred By:      Roma Schanz CNM  ---------------------------------------------------------------------- Orders  #  Description                           Code        Ordered By  1  [place] MFM OB DETAIL +14 [place]               76811.01    KIMBERLY BOOKER  2  [place] MFM FETAL BPP WO NON               76819.01    KIMBERLY BOOKER     STRESS  3  [place] MFM UA CORD DOPPLER                76820.02    [facility] BOOKER ----------------------------------------------------------------------  #  Order #                     Accession #                Episode #  1  [id number]                   [id number]                 [id number]  2  [id number]                   [id number]                 [id number]  3  [id number]                   [id number]  [id number] ---------------------------------------------------------------------- Indications  Maternal care for known or suspected poor      O36.5930  fetal growth, third trimester, not applicable or  unspecified IUGR  Encounter for antenatal screening for          Z36.3  malformations  Seizure disorder(No Meds)                      O99.350 G40.909  Hyperthyroid (No Meds)                         O99.280 E05.90  [redacted] weeks gestation of pregnancy                Z3A.36  Low Risk NIPS(Negative Horizon)(Negative  AFP) ---------------------------------------------------------------------- Fetal Evaluation  Num Of Fetuses:         1  Fetal Heart Rate(bpm):  160  Cardiac Activity:       Observed  Presentation:           Cephalic  Placenta:               Anterior  P. Cord Insertion:      Visualized  Amniotic Fluid  AFI FV:      Subjectively low-normal  AFI Sum(cm)     %Tile       Largest Pocket(cm)  7.7             6           3.3  RUQ(cm)       RLQ(cm)       LUQ(cm)        LLQ(cm)  3.3           1.1           2.6            0.8 ---------------------------------------------------------------------- Biophysical Evaluation  Amniotic F.V:   Pocket => 2 cm             F. Tone:        Observed  F. Movement:    Observed                   Score:           8/8  F. Breathing:   Observed ---------------------------------------------------------------------- Biometry  BPD:      83.1  mm     G. Age:  33w 3d        2.3  %    CI:        77.32   %    70 - 86                                                          FL/HC:      21.6   %    20.1 - 22.1  HC:      299.2  mm     G. Age:  33w 1d        < 1  %    HC/AC:      1.01        0.93 - 1.11  AC:      294.9  mm     G. Age:  33w 3d        2.5  %  FL/BPD:     77.9   %    71 - 87  FL:       64.7  mm     G. Age:  33w 3d        1.3  %    FL/AC:      21.9   %    20 - 24  HUM:      57.3  mm     G. Age:  33w 2d          9  %  Est. FW:    2189  gm    4 lb 13 oz     2.6  % ---------------------------------------------------------------------- OB History  Gravidity:    1 ---------------------------------------------------------------------- Gestational Age  LMP:           39w 1d        Date:  01/14/21                 EDD:   10/21/21  U/S Today:     33w 3d                                        EDD:   11/30/21  Best:          36w 3d     Det. ByLoman Chroman         EDD:   11/09/21                                      (03/18/21) ---------------------------------------------------------------------- Anatomy  Cranium:               Appears normal         Aortic Arch:            Not well visualized  Cavum:                 Appears normal         Ductal Arch:            Not well visualized  Ventricles:            Appears normal         Diaphragm:              Appears normal  Choroid Plexus:        Appears normal         Stomach:                Appears normal, left                                                                        sided  Cerebellum:            Not well visualized    Abdomen:                Appears normal  Posterior Fossa:       Not well visualized    Abdominal Wall:         Not well  visualized  Nuchal Fold:           Not applicable (Q000111Q    Cord Vessels:           Appears normal ([redacted]                         wks  GA)                                        vessel cord)  Face:                  Orbits appear          Kidneys:                Appear normal                         normal  Lips:                  Not well visualized    Bladder:                Appears normal  Thoracic:              Appears normal         Spine:                  Not well visualized  Heart:                 Appears normal         Upper Extremities:      Not well visualized                         (4CH, axis, and                         situs)  RVOT:                  Appears normal         Lower Extremities:      Appears normal  LVOT:                  Appears normal  Other:  Fetus appears to be a female. Nasal bone visualized. Technically          difficult due to advanced GA and fetal position. ---------------------------------------------------------------------- Doppler - Fetal Vessels  Umbilical Artery   S/D     %tile      RI    %tile      PI    %tile     PSV    ADFV    RDFV                                                     (cm/s)   2.92       80    0.66       85       1       82    24.11      No      No ---------------------------------------------------------------------- Impression  Follow up growth due to severe fetal growth restriction.  Normal interval growth with measurements consistent with  FGR 2.3%  Good fetal movement and amniotic fluid volume  Biophysical profile 8/8 was seen  Normal UA dopplers without evidence of AEDF or REDF.  She was counseled regarding continue daily kick counts.  She is scheduled for IOL on Friday. ----------------------------------------------------------------------               Sander Nephew, MD Electronically Signed Final Report   10/15/2021 05:15 pm ----------------------------------------------------------------------  [place] MFM UA CORD DOPPLER  Result Date: 10/15/2021 ----------------------------------------------------------------------  OBSTETRICS REPORT                       (Signed Final 10/15/2021  05:15 pm) ---------------------------------------------------------------------- Patient Info  ID #:       [id number]                          D.O.B.:  [birth year] (20 yrs)  Name:       Sandy Carter [facility]                Visit Date: 10/15/2021 02:35 pm ---------------------------------------------------------------------- Performed By  Attending:        Sander Nephew      Ref. Address:     [facility]                    MD                                                             OB/GYN                                                             [place]                                                             [place]  Performed By:     Jeanene Erb BS,      Location[facility]         [facility]  Referred By:      Roma Schanz CNM ---------------------------------------------------------------------- Orders  #  Description                           Code        Ordered By  1  [place] MFM OB DETAIL +14 [place]               76811.01    KIMBERLY BOOKER  2  [place] MFM FETAL BPP WO NON               76819.01    KIMBERLY BOOKER     STRESS  3  [place] MFM UA CORD DOPPLER                76820.02    [facility] BOOKER ----------------------------------------------------------------------  #  Order #                     Accession #                Episode #  1  [id number]                   [id number]                 [id number]  2  [id number]                   [id number]                 [id number]  3  [id number]                   [id number]                 [id number] ---------------------------------------------------------------------- Indications  Maternal care for known or suspected poor      O36.5930  fetal growth, third trimester, not  applicable or  unspecified IUGR  Encounter for antenatal screening for          Z36.3  malformations  Seizure disorder(No Meds)                      O99.350 G40.909  Hyperthyroid (No Meds)                         O99.280 E05.90  [redacted] weeks gestation of pregnancy                Z3A.36  Low Risk NIPS(Negative Horizon)(Negative  AFP) ---------------------------------------------------------------------- Fetal Evaluation  Num Of Fetuses:         1  Fetal Heart Rate(bpm):  160  Cardiac Activity:       Observed  Presentation:           Cephalic  Placenta:               Anterior  P. Cord Insertion:      Visualized  Amniotic Fluid  AFI FV:      Subjectively low-normal  AFI Sum(cm)     %Tile       Largest Pocket(cm)  7.7             6           3.3  RUQ(cm)       RLQ(cm)       LUQ(cm)        LLQ(cm)  3.3           1.1           2.6            0.8 ---------------------------------------------------------------------- Biophysical Evaluation  Amniotic F.V:   Pocket => 2 cm             F. Tone:        Observed  F. Movement:    Observed                   Score:          8/8  F. Breathing:   Observed ---------------------------------------------------------------------- Biometry  BPD:      83.1  mm     G. Age:  33w 3d        2.3  %    CI:        77.32   %    70 - 86                                                          FL/HC:      21.6   %    20.1 - 22.1  HC:      299.2  mm     G. Age:  33w 1d        < 1  %    HC/AC:      1.01        0.93 - 1.11  AC:      294.9  mm     G. Age:  33w 3d        2.5  %    FL/BPD:     77.9   %    71 - 87  FL:       64.7  mm     G. Age:  33w 3d        1.3  %    FL/AC:      21.9   %    20 - 24  HUM:      57.3  mm     G. Age:  33w 2d          9  %  Est. FW:    2189  gm    4 lb 13 oz     2.6  % ---------------------------------------------------------------------- OB History  Gravidity:    1 ---------------------------------------------------------------------- Gestational Age  LMP:           39w 1d         Date:  01/14/21                 EDD:   10/21/21  U/S Today:     33w 3d  EDD:   11/30/21  Best:          36w 3d     Det. ByLoman Chroman         EDD:   11/09/21                                      (03/18/21) ---------------------------------------------------------------------- Anatomy  Cranium:               Appears normal         Aortic Arch:            Not well visualized  Cavum:                 Appears normal         Ductal Arch:            Not well visualized  Ventricles:            Appears normal         Diaphragm:              Appears normal  Choroid Plexus:        Appears normal         Stomach:                Appears normal, left                                                                        sided  Cerebellum:            Not well visualized    Abdomen:                Appears normal  Posterior Fossa:       Not well visualized    Abdominal Wall:         Not well visualized  Nuchal Fold:           Not applicable (Q000111Q    Cord Vessels:           Appears normal ([redacted]                         wks GA)                                        vessel cord)  Face:                  Orbits appear          Kidneys:                Appear normal                         normal  Lips:                  Not well visualized    Bladder:                Appears normal  Thoracic:  Appears normal         Spine:                  Not well visualized  Heart:                 Appears normal         Upper Extremities:      Not well visualized                         (4CH, axis, and                         situs)  RVOT:                  Appears normal         Lower Extremities:      Appears normal  LVOT:                  Appears normal  Other:  Fetus appears to be a female. Nasal bone visualized. Technically          difficult due to advanced GA and fetal position. ---------------------------------------------------------------------- Doppler - Fetal Vessels  Umbilical Artery   S/D      %tile      RI    %tile      PI    %tile     PSV    ADFV    RDFV                                                     (cm/s)   2.92       80    0.66       85       1       82    24.11      No      No ---------------------------------------------------------------------- Impression  Follow up growth due to severe fetal growth restriction.  Normal interval growth with measurements consistent with  FGR 2.3%  Good fetal movement and amniotic fluid volume  Biophysical profile 8/8 was seen  Normal UA dopplers without evidence of AEDF or REDF.  She was counseled regarding continue daily kick counts.  She is scheduled for IOL on Friday. ----------------------------------------------------------------------               Sander Nephew, MD Electronically Signed Final Report   10/15/2021 05:15 pm ----------------------------------------------------------------------     Assessment/Plan Acute appendicitis  - afebrile, WBC 18.7 - recommend proceeding to the operating room for laparoscopic, possible open appendectomy by Dr. Dema Severin.  - COVID test pending   [redacted]w[redacted]d pregnancy w/ IUGR per OBGYN  Seizures hyperthyroidism  FEN - NPO, IVF VTE - SCD's ID - Zosyn Admit - to OB-GYN service, [facility] will follow closely.   [place], [facility] Surgery 10/17/2021, 9:17 AM Please see Amion for pager number during day hours 7:00am-4:30pm or 7:00am -11:30am on weekends

## 2021-10-17 NOTE — H&P (Signed)
FACULTY PRACTICE ANTEPARTUM ADMISSION HISTORY AND PHYSICAL NOTE   History of Present Illness: MAYU ROCKHOLT is a 21 y.o. G1P0 at [redacted]w[redacted]d admitted for abdominal pain due to acute appendicitis.    Patient presented to [facility] earlier today endorsing RLQ abdominal pain with exquisite tenderness. MRI was performed which showed acute appendicitis without evidence of rupture, general surgery was consulted.   Patient reports the fetal movement as active. Patient reports uterine contraction  activity as irregular. Patient reports  vaginal bleeding as none. Patient describes fluid per vagina as None. Fetal presentation is cephalic.  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   ODD (oppositional defiant disorder) 06/10/2014    Past Medical History:  Diagnosis Date   ADD (attention deficit disorder)     in her teens   Anxiety    Depression    Dysrhythmia    palpitations - from Hyperthyroidism   Headache    Hyperthyroidism    not treated at this time   Lumbar herniated disc    ODD (oppositional defiant disorder)    in her teens   Seizures ([place])    shakes alot, is aware of what is going on, feels them coming on, has not seen a neurologist   Tuberculosis    before age 21    Past Surgical History:  Procedure Laterality Date   COLONOSCOPY WITH PROPOFOL N/A 09/17/2020   Procedure: COLONOSCOPY WITH PROPOFOL;  Surgeon: Eloise Harman, DO;  [facility];  Service: Endoscopy;   Laterality: N/A;  2:00pm   LUMBAR LAMINECTOMY/DECOMPRESSION MICRODISCECTOMY Right 12/28/2020   Procedure: Right Lumbar Five Sacral One Microdiscectomy;  Surgeon: Ashok Pall, MD;  Location: [facility];  Service: Neurosurgery;  Laterality: Right;   WISDOM TOOTH EXTRACTION      OB History  Gravida Para Term Preterm AB Living  1            SAB IAB Ectopic Multiple Live Births               # Outcome Date GA Lbr Len/2nd Weight Sex Delivery Anes PTL Lv  1 Current             Social History   Socioeconomic History   Marital status: Significant Other    Spouse name: Not on file   Number of children: Not on file   Years of education: Not on file   Highest education level: Not on file  Occupational History   Not on file  Tobacco Use   Smoking status: Never   Smokeless tobacco: Never  Vaping Use   Vaping Use: Former  Substance and Sexual Activity   Alcohol use: Not Currently   Drug use: Not Currently    Types: Marijuana    Comment: last use nov 10th 2022   Sexual activity: Yes    Birth control/protection: None  Other Topics Concern   Not on file  Social History Narrative   Not on file   Social Determinants of Health   Financial Resource Strain: Medium Risk   Difficulty of Paying Living Expenses:  Somewhat hard  Food Insecurity: Food Insecurity Present   Worried About Charity fundraiser in the Last Year: Sometimes true   Ran Out of Food in the Last Year: Never true  Transportation Needs: No Transportation Needs   Lack of Transportation (Medical): No   Lack of Transportation (Non-Medical): No  Physical Activity: Sufficiently Active   Days of Exercise per Week: 5 days   Minutes of Exercise per Session: 100 min  Stress: No Stress Concern Present   Feeling of Stress : Only a little  Social Connections: Moderately Isolated   Frequency of Communication with Friends and Family: Three times a week   Frequency of Social Gatherings with Friends and Family: Never   Attends  Religious Services: Never   Marine scientist or Organizations: No   Attends Music therapist: Never   Marital Status: Living with partner    Family History  Adopted: Yes  Problem Relation Age of Onset   Alcoholism Mother    Bone cancer Mother    Diabetes Maternal Grandmother     Allergies  Allergen Reactions   Penicillins Rash    Reaction: 5 years   Penicillin G Other (See Comments)    Medications Prior to Admission  Medication Sig Dispense Refill Last Dose   Prenatal Vit-Fe Fumarate-FA (PRENATAL VITAMIN PO) Take by mouth.   10/16/2021   Acetaminophen (TYLENOL PO) Take by mouth as needed.      Blood Pressure Monitor MISC For regular home bp monitoring during pregnancy (Patient not taking: Reported on 10/15/2021) 1 each 0    hydrOXYzine (ATARAX/VISTARIL) 50 MG tablet Take 50 mg by mouth every 6 (six) hours as needed for anxiety or itching (Seizures).      PROAIR HFA 108 (90 Base) MCG/ACT inhaler Inhale 1 puff into the lungs every 6 (six) hours as needed for wheezing or shortness of breath. 1 each 1     Review of Systems Pertinent positives and negative per HPI, all others reviewed and negative   Vitals:  BP 126/87    Pulse (!) 110    Temp 98.3 F (36.8 C) (Oral)    Resp 18    Ht 5\' 2"  (1.575 m)    Wt 62.1 kg    LMP 01/14/2021 (Exact Date)    SpO2 99%    BMI 25.06 kg/m  Physical Exam Vitals reviewed.  Constitutional:      Appearance: She is well-developed.  Eyes:     General: No scleral icterus. Cardiovascular:     Rate and Rhythm: Regular rhythm.  Pulmonary:     Effort: Pulmonary effort is normal. No respiratory distress.  Abdominal:     Palpations: Abdomen is soft.     Tenderness: There is abdominal tenderness in the right lower quadrant.  Skin:    General: Skin is warm and dry.  Neurological:     General: No focal deficit present.     Mental Status: She is alert.  Psychiatric:        Mood and Affect: Mood normal.        Behavior: Behavior  normal.     Cervix: Not evaluated. Vertex by MRI.  Fetal Monitoring:Baseline: 130 bpm, Variability: Good {> 6 bpm), Accelerations: Reactive, and Decelerations: Absent Tocometer: Irregular, q10-13 min  Labs:  Results for orders placed or performed during the hospital encounter of 10/16/21 (from the past 24 hour(s))  Urinalysis, Routine w reflex microscopic Urine, Clean Catch   Collection Time: 10/16/21 10:53 PM  Result  Value Ref Range   Color, Urine YELLOW YELLOW   APPearance HAZY (A) CLEAR   Specific Gravity, Urine 1.021 1.005 - 1.030   pH 5.0 5.0 - 8.0   Glucose, UA NEGATIVE NEGATIVE mg/dL   Hgb urine dipstick NEGATIVE NEGATIVE   Bilirubin Urine NEGATIVE NEGATIVE   Ketones, ur 80 (A) NEGATIVE mg/dL   Protein, ur 30 (A) NEGATIVE mg/dL   Nitrite NEGATIVE NEGATIVE   Leukocytes,Ua TRACE (A) NEGATIVE   RBC / HPF 0-5 0 - 5 RBC/hpf   WBC, UA 0-5 0 - 5 WBC/hpf   Bacteria, UA RARE (A) NONE SEEN   Squamous Epithelial / LPF 0-5 0 - 5   Mucus PRESENT   CBC with Differential/Platelet   Collection Time: 10/16/21 11:26 PM  Result Value Ref Range   WBC 18.7 (H) 4.0 - 10.5 K/uL   RBC 3.45 (L) 3.87 - 5.11 MIL/uL   Hemoglobin 9.9 (L) 12.0 - 15.0 g/dL   HCT 30.5 (L) 36.0 - 46.0 %   MCV 88.4 80.0 - 100.0 fL   MCH 28.7 26.0 - 34.0 pg   MCHC 32.5 30.0 - 36.0 g/dL   RDW 12.7 11.5 - 15.5 %   Platelets 261 150 - 400 K/uL   nRBC 0.0 0.0 - 0.2 %   Neutrophils Relative % 87 %   Neutro Abs 16.2 (H) 1.7 - 7.7 K/uL   Lymphocytes Relative 9 %   Lymphs Abs 1.7 0.7 - 4.0 K/uL   Monocytes Relative 4 %   Monocytes Absolute 0.7 0.1 - 1.0 K/uL   Eosinophils Relative 0 %   Eosinophils Absolute 0.0 0.0 - 0.5 K/uL   Basophils Relative 0 %   Basophils Absolute 0.0 0.0 - 0.1 K/uL   Immature Granulocytes 0 %   Abs Immature Granulocytes 0.07 0.00 - 0.07 K/uL  Comprehensive metabolic panel   Collection Time: 10/16/21 11:26 PM  Result Value Ref Range   Sodium 131 (L) 135 - 145 mmol/L   Potassium 3.8 3.5 -  5.1 mmol/L   Chloride 101 98 - 111 mmol/L   CO2 20 (L) 22 - 32 mmol/L   Glucose, Bld 127 (H) 70 - 99 mg/dL   BUN 10 6 - 20 mg/dL   Creatinine, Ser 0.71 0.44 - 1.00 mg/dL   Calcium 8.7 (L) 8.9 - 10.3 mg/dL   Total Protein 6.2 (L) 6.5 - 8.1 g/dL   Albumin 2.7 (L) 3.5 - 5.0 g/dL   AST 23 15 - 41 U/L   ALT 15 0 - 44 U/L   Alkaline Phosphatase 110 38 - 126 U/L   Total Bilirubin 0.6 0.3 - 1.2 mg/dL   GFR, Estimated >60 >60 mL/min   Anion gap 10 5 - 15  Type and screen [place]   Collection Time: 10/17/21  9:37 AM  Result Value Ref Range   ABO/RH(D) PENDING    Antibody Screen PENDING    Sample Expiration      10/20/2021,2359 Performed at [facility], [address] [place], [place] [address]     Imaging Studies: MR PELVIS WO CONTRAST  Result Date: 10/17/2021 CLINICAL DATA:  Acute abdominal pain of the RIGHT lower quadrant in a 21 year old female at 77 weeks of pregnancy. EXAM: MRI ABDOMEN AND PELVIS WITHOUT CONTRAST TECHNIQUE: Multiplanar multisequence MR imaging of the abdomen and pelvis was performed. No intravenous contrast was administered. COMPARISON:  None FINDINGS: COMBINED FINDINGS FOR BOTH MR ABDOMEN AND PELVIS Lower chest: Incidental imaging of the lung  bases is unremarkable by MRI with limited assessment. Hepatobiliary: No biliary duct dilation. No pericholecystic stranding, sludge in the gallbladder. Limited assessment of the liver is unremarkable. Pancreas: Pancreas with normal intrinsic T1 signal. No signs of peripancreatic inflammation. Spleen:  Normal in size and contour.  No focal lesion. Adrenals/Urinary Tract: Adrenal glands are normal. There is mild to moderate RIGHT-sided hydroureteronephrosis in the setting of a gravid uterus with compression upon the distal RIGHT ureter at the level of the sacral promontory, likely physiologic dilation of the upper RIGHT urinary tract in the setting of this late stage of pregnancy. Stomach/Bowel: Blind-ending  tubular structure with dilation, internal heterogeneity and marked surrounding edema extending inferiorly from the cecal tip, does not appear to be frankly retrocecal. Trace fluid in the retroperitoneum adjacent to the appendix is nonfocal. Edema tracks along the RIGHT flank. Structure in the RIGHT lower quadrant arises from the cecum. Maximal caliber 13 mm. Vascular/Lymphatic: Vascular structures not well assessed given lack of intravenous contrast. No gross adenopathy in the abdomen or in the pelvis. Reproductive: Gravid uterus. Fetus in vertex position, anterior placenta. Study not performed for fetal evaluation. The ovaries are grossly normal is. Other: Edema tracks along the RIGHT flank in and is present in the RIGHT lower quadrant. Musculoskeletal: No suspicious bone lesions identified. IMPRESSION: 1. Findings of acute appendicitis with signs of marked inflammation about the RIGHT lower quadrant. No definitive signs of rupture though inflammation tracks throughout the RIGHT lower quadrant extending along the RIGHT paracolic gutter and posterior to the cecum and ascending colon, possibility of early or impending rupture is considered for this reason. There is no free fluid in the pelvis. 2. Moderate RIGHT-sided ureteral dilation in the setting of uterine compression without signs of perinephric stranding, compatible with physiologic dilation of the RIGHT urinary tract. 3. Gravid uterus, feet is in vertex position. Study not performed for fetal evaluation. 4. Gallbladder sludge. These results were called by telephone at the time of interpretation on 10/17/2021 at 8:31 am to provider Dr. Gaynelle Adu, Who verbally acknowledged these results. Electronically Signed   By: Zetta Bills M.D.   On: 10/17/2021 08:40   MR ABDOMEN WO CONTRAST  Result Date: 10/17/2021 CLINICAL DATA:  Acute abdominal pain of the RIGHT lower quadrant in a 21 year old female at 67 weeks of pregnancy. EXAM: MRI ABDOMEN AND PELVIS WITHOUT  CONTRAST TECHNIQUE: Multiplanar multisequence MR imaging of the abdomen and pelvis was performed. No intravenous contrast was administered. COMPARISON:  None FINDINGS: COMBINED FINDINGS FOR BOTH MR ABDOMEN AND PELVIS Lower chest: Incidental imaging of the lung bases is unremarkable by MRI with limited assessment. Hepatobiliary: No biliary duct dilation. No pericholecystic stranding, sludge in the gallbladder. Limited assessment of the liver is unremarkable. Pancreas: Pancreas with normal intrinsic T1 signal. No signs of peripancreatic inflammation. Spleen:  Normal in size and contour.  No focal lesion. Adrenals/Urinary Tract: Adrenal glands are normal. There is mild to moderate RIGHT-sided hydroureteronephrosis in the setting of a gravid uterus with compression upon the distal RIGHT ureter at the level of the sacral promontory, likely physiologic dilation of the upper RIGHT urinary tract in the setting of this late stage of pregnancy. Stomach/Bowel: Blind-ending tubular structure with dilation, internal heterogeneity and marked surrounding edema extending inferiorly from the cecal tip, does not appear to be frankly retrocecal. Trace fluid in the retroperitoneum adjacent to the appendix is nonfocal. Edema tracks along the RIGHT flank. Structure in the RIGHT lower quadrant arises from the cecum. Maximal caliber 13 mm. Vascular/Lymphatic: Vascular  structures not well assessed given lack of intravenous contrast. No gross adenopathy in the abdomen or in the pelvis. Reproductive: Gravid uterus. Fetus in vertex position, anterior placenta. Study not performed for fetal evaluation. The ovaries are grossly normal is. Other: Edema tracks along the RIGHT flank in and is present in the RIGHT lower quadrant. Musculoskeletal: No suspicious bone lesions identified. IMPRESSION: 1. Findings of acute appendicitis with signs of marked inflammation about the RIGHT lower quadrant. No definitive signs of rupture though inflammation  tracks throughout the RIGHT lower quadrant extending along the RIGHT paracolic gutter and posterior to the cecum and ascending colon, possibility of early or impending rupture is considered for this reason. There is no free fluid in the pelvis. 2. Moderate RIGHT-sided ureteral dilation in the setting of uterine compression without signs of perinephric stranding, compatible with physiologic dilation of the RIGHT urinary tract. 3. Gravid uterus, feet is in vertex position. Study not performed for fetal evaluation. 4. Gallbladder sludge. These results were called by telephone at the time of interpretation on 10/17/2021 at 8:31 am to provider Dr. Gaynelle Adu, Who verbally acknowledged these results. Electronically Signed   By: Zetta Bills M.D.   On: 10/17/2021 08:40   US OB Follow Up  Result Date: 10/11/2021 Table formatting from the original result was not included. Images from the original result were not included.  ..an [employer] Diplomatic Services operational officer) accredited practice Center for [facility] [place] @ [place] [address] Ordering Provider: Roma Schanz, CNM FOLLOW UP SONOGRAM MARISABEL MITROVIC is in the office for a follow up sonogram for EFW,BPP and cord dopplers. She is a 21 y.o. year old G1P0 with Estimated Date of Delivery: 11/09/21 by early ultrasound now at  [redacted]w[redacted]d weeks gestation. Thus far the pregnancy has been complicated by small for dates. GESTATION: SINGLETON PRESENTATION: cephalic FETAL ACTIVITY:          Heart rate         146          The fetus is active. AMNIOTIC FLUID: The amniotic fluid volume is  normal, 10.1 cm. PLACENTA LOCALIZATION:  anterior GRADE 2 CERVIX: Limited view ADNEXA: The ovaries are normal. GESTATIONAL AGE AND  BIOMETRICS: Gestational criteria: Estimated Date of Delivery: 11/09/21 by early ultrasound now at [redacted]w[redacted]d Previous Scans:3          BIPARIETAL DIAMETER           8.20 cm         32+6 weeks   14.6% HEAD CIRCUMFERENCE            30.57 cm         34 weeks   12% ABDOMINAL CIRCUMFERENCE           27.72 cm         31+5 weeks   3.4% FEMUR LENGTH           6.12 cm         31+5 weeks   2.1%                                                       AVERAGE EGA(BY THIS SCAN):  32+4 weeks  ESTIMATED FETAL WEIGHT:       1902  grams, 4.8 % BIOPHYSICAL PROFILE:                                                                                                      COMMENTS GROSS BODY MOVEMENT                  2  TONE                2  RESPIRATIONS                2  AMNIOTIC FLUID                2                                                          SCORE:  8/8 (Note: NST was not performed as part of this antepartum testing)  DOPPLER FLOW STUDIES: UMBILICAL ARTERY RI RATIOS:    .64,.69,.73,.62=84% ANATOMICAL SURVEY                                                                            COMMENTS CEREBRAL VENTRICLES yes normal  CHOROID PLEXUS yes normal  CEREBELLUM yes normal  CISTERNA MAGNA  Yes  normal   CAVUM SEPTI PELLUCIDI YES NORMAL  NUCHAL REGION yes normal  ORBITS yes normal  NASAL BONE yes normal  NOSE/LIP yes normal  FACIAL PROFILE yes normal  4 CHAMBERED HEART yes normal  OUTFLOW TRACTS YES normaL  3VV YES NORMAL  3VTV YES NORMAL  SITUS YES NORMAL      DIAPHRAGM yes normal  STOMACH yes normal  RENAL REGION yes normal  BLADDER yes normal          3 VESSEL CORD yes normal  SPINE yes normal          GENITALIA yes normal female     SUSPECTED ABNORMALITIES:  yes,EFW 4.8% QUALITY OF SCAN: satisfactory TECHNICIAN COMMENTS: [place] [id number] wks,cephalic,BPP [id number] bpm,anterior placenta gr 2,AFI 10.1 cm,RI .64,.69,.73,.62=84%,EFW 1902 g 4.8%,Kim Booker discussed results with patient A copy of this report including all images has been saved and backed up to a second source for retrieval if needed. All measures and details of the anatomical scan, placentation, fluid volume and pelvic anatomy are contained in that  report. Amber Heide Guile 09/30/2021 3:09 PM Clinical Impression and recommendations: I have reviewed the sonogram results above, combined with the patient's current clinical course, below are my impressions and any appropriate recommendations for management based on the sonographic findings. 1.  G1P0 Estimated  Date of Delivery: 11/09/21 by serial sonographic evaluations 2.  Fetal sonographic surveillance findings: a). Normal fluid volume b). Normal antepartum fetal assessment with BPP 8/8 c). Normal fetal Doppler ratios with consistent diastolic flow:  123456 d). Normal growth percentile with appropriate interval growth:  4.8% 3.  Normal general sonographic findings Recommend continued prenatal evaluations and care based on this sonogram and as clinically indicated from the patient's clinical course. Annalee Genta 10/01/2021 10:19 AM PHYSICIAN ATTESTATION FOR FORMAL SONOGRAM REVIEW: I have reviewed the  report, as well as, the associated images for this study and agree with the technician comments and clinical impression/  Recommendations which are noted above.  Jacelyn Grip MD Attending Physician for the Center for [facility] 10/11/2021 6:24 PM   US Fetal BPP W/O Non Stress  Result Date: 10/11/2021 Table formatting from the original result was not included. Images from the original result were not included.  ..an [employer] Diplomatic Services operational officer) accredited practice Center for [facility] @ [place] [address] Ordering Provider: Janyth Pupa, DO FOLLOW UP SONOGRAM SAMARIYAH MARTINS is in the office for a follow up sonogram for BPP and cord dopplers. She is a 21 y.o. year old G1P0 with Estimated Date of Delivery: 11/09/21 by early ultrasound now at  [redacted]w[redacted]d weeks gestation. Thus far the pregnancy has been complicated by FGR. GESTATION: SINGLETON PRESENTATION: cephalic FETAL ACTIVITY:          Heart rate         150          The fetus  is active. AMNIOTIC FLUID: The amniotic fluid volume is  normal, 13 cm. PLACENTA LOCALIZATION:  anterior GRADE 2 CERVIX: Limited view ADNEXA: The ovaries are normal. GESTATIONAL AGE AND  BIOMETRICS: Gestational criteria: Estimated Date of Delivery: 11/09/21 by early ultrasound now at [redacted]w[redacted]d Previous Scans:4 BIOPHYSICAL PROFILE:                                                                                                      COMMENTS GROSS BODY MOVEMENT                 2  TONE                2  RESPIRATIONS                2  AMNIOTIC FLUID                2                                                          SCORE:  8/8 (Note: NST was not performed as part of this antepartum testing)  DOPPLER FLOW STUDIES: UMBILICAL ARTERY RI RATIOS:    .74,.66,.64,.63=77% ANATOMICAL SURVEY  COMMENTS CEREBRAL VENTRICLES yes normal  CHOROID PLEXUS yes normal  CEREBELLUM yes normal  CISTERNA MAGNA  Yes  normal   CAVUM SEPTI PELLUCIDI YES NORMAL              NOSE/LIP yes normal  FACIAL PROFILE yes normal  4 CHAMBERED HEART yes normal  OUTFLOW TRACTS YES normaL  3VV YES NORMAL  3VTV YES NORMAL  SITUS YES NORMAL      DIAPHRAGM yes normal  STOMACH yes normal  RENAL REGION yes normal  BLADDER yes normal          3 VESSEL CORD yes normal  SPINE yes normal          GENITALIA yes normal female     SUSPECTED ABNORMALITIES:  no QUALITY OF SCAN: satisfactory TECHNICIAN COMMENTS: [place] [id number] wks,cephalic,anterior placenta gr 2,BPP 8/8,FHR 150 bpm,RI .74,.66,.64,.63=77%,AFI 13 cm A copy of this report including all images has been saved and backed up to a second source for retrieval if needed. All measures and details of the anatomical scan, placentation, fluid volume and pelvic anatomy are contained in that report. Amber Heide Guile 10/07/2021 11:38 AM Clinical Impression and recommendations: I have reviewed the sonogram results above, combined with the patient's current clinical  course, below are my impressions and any appropriate recommendations for management based on the sonographic findings. 1.  G1P0 Estimated Date of Delivery: 11/09/21 by serial sonographic evaluations 2.  Fetal sonographic surveillance findings: a). Normal fluid volume b). Normal antepartum fetal assessment with BPP 8/8 c). Normal fetal Doppler ratios with consistent diastolic flow:  [id number] 3.  Normal general sonographic findings Recommend continued prenatal evaluations and care based on this sonogram and as clinically indicated from the patient's clinical course. Annalee Genta 10/08/2021 6:10 PM PHYSICIAN ATTESTATION FOR FORMAL SONOGRAM REVIEW: I have reviewed the  report, as well as, the associated images for this study and agree with the technician comments and clinical impression/  Recommendations which are noted above.  Jacelyn Grip MD Attending Physician for the [facility] for [facility] 10/11/2021 6:26 PM  US Fetal BPP W/O Non Stress  Result Date: 10/11/2021 Table formatting from the original result was not included. Images from the original result were not included.  ..an [employer] Diplomatic Services operational officer) accredited practice Center for [facility] @ [place] [address] Ordering Provider: Roma Schanz, CNM FOLLOW UP SONOGRAM BETTYJANE KULHANEK is in the office for a follow up sonogram for EFW,BPP and cord dopplers. She is a 20 y.o. year old G1P0 with Estimated Date of Delivery: 11/09/21 by early ultrasound now at  [redacted]w[redacted]d weeks gestation. Thus far the pregnancy has been complicated by small for dates. GESTATION: SINGLETON PRESENTATION: cephalic FETAL ACTIVITY:          Heart rate         146          The fetus is active. AMNIOTIC FLUID: The amniotic fluid volume is  normal, 10.1 cm. PLACENTA LOCALIZATION:  anterior GRADE 2 CERVIX: Limited view ADNEXA: The ovaries are normal. GESTATIONAL AGE AND  BIOMETRICS: Gestational  criteria: Estimated Date of Delivery: 11/09/21 by early ultrasound now at [redacted]w[redacted]d Previous Scans:3          BIPARIETAL DIAMETER           8.20 cm         32+6 weeks   14.6% HEAD CIRCUMFERENCE  30.57 cm         34 weeks   12% ABDOMINAL CIRCUMFERENCE           27.72 cm         31+5 weeks   3.4% FEMUR LENGTH           6.12 cm         31+5 weeks   2.1%                                                       AVERAGE EGA(BY THIS SCAN):  32+4 weeks                                                 ESTIMATED FETAL WEIGHT:       1902  grams, 4.8 % BIOPHYSICAL PROFILE:                                                                                                      COMMENTS GROSS BODY MOVEMENT                  2  TONE                2  RESPIRATIONS                2  AMNIOTIC FLUID                2                                                          SCORE:  8/8 (Note: NST was not performed as part of this antepartum testing)  DOPPLER FLOW STUDIES: UMBILICAL ARTERY RI RATIOS:    .64,.69,.73,.62=84% ANATOMICAL SURVEY                                                                            COMMENTS CEREBRAL VENTRICLES yes normal  CHOROID PLEXUS yes normal  CEREBELLUM yes normal  CISTERNA MAGNA  Yes  normal   CAVUM SEPTI PELLUCIDI YES NORMAL  NUCHAL REGION yes normal  ORBITS yes normal  NASAL BONE yes normal  NOSE/LIP yes normal  FACIAL PROFILE yes normal  4 CHAMBERED HEART yes normal  OUTFLOW TRACTS YES normaL  3VV YES NORMAL  3VTV  YES NORMAL  SITUS YES NORMAL      DIAPHRAGM yes normal  STOMACH yes normal  RENAL REGION yes normal  BLADDER yes normal          3 VESSEL CORD yes normal  SPINE yes normal          GENITALIA yes normal female     SUSPECTED ABNORMALITIES:  yes,EFW 4.8% QUALITY OF SCAN: satisfactory TECHNICIAN COMMENTS: [place] 0000000 wks,cephalic,BPP [id number] bpm,anterior placenta gr 2,AFI 10.1 cm,RI .64,.69,.73,.62=84%,EFW 1902 g 4.8%,Kim Booker discussed results with patient A copy of this report including all  images has been saved and backed up to a second source for retrieval if needed. All measures and details of the anatomical scan, placentation, fluid volume and pelvic anatomy are contained in that report. Amber Heide Guile 09/30/2021 3:09 PM Clinical Impression and recommendations: I have reviewed the sonogram results above, combined with the patient's current clinical course, below are my impressions and any appropriate recommendations for management based on the sonographic findings. 1.  G1P0 Estimated Date of Delivery: 11/09/21 by serial sonographic evaluations 2.  Fetal sonographic surveillance findings: a). Normal fluid volume b). Normal antepartum fetal assessment with BPP 8/8 c). Normal fetal Doppler ratios with consistent diastolic flow:  123456 d). Normal growth percentile with appropriate interval growth:  4.8% 3.  Normal general sonographic findings Recommend continued prenatal evaluations and care based on this sonogram and as clinically indicated from the patient's clinical course. Annalee Genta 10/01/2021 10:19 AM PHYSICIAN ATTESTATION FOR FORMAL SONOGRAM REVIEW: I have reviewed the  report, as well as, the associated images for this study and agree with the technician comments and clinical impression/  Recommendations which are noted above.  Jacelyn Grip MD Attending Physician for the Center for [facility] 10/11/2021 6:24 PM   [place] UA Cord Doppler  Result Date: 10/11/2021 Table formatting from the original result was not included. Images from the original result were not included.  ..an [employer] Diplomatic Services operational officer) accredited practice Center for [facility] @ [place] [address] Ordering Provider: Janyth Pupa, DO FOLLOW UP SONOGRAM TANYLAH BIESCHKE is in the office for a follow up sonogram for BPP and cord dopplers. She is a 21 y.o. year old G1P0 with Estimated Date of Delivery: 11/09/21 by early ultrasound now at   [redacted]w[redacted]d weeks gestation. Thus far the pregnancy has been complicated by FGR. GESTATION: SINGLETON PRESENTATION: cephalic FETAL ACTIVITY:          Heart rate         150          The fetus is active. AMNIOTIC FLUID: The amniotic fluid volume is  normal, 13 cm. PLACENTA LOCALIZATION:  anterior GRADE 2 CERVIX: Limited view ADNEXA: The ovaries are normal. GESTATIONAL AGE AND  BIOMETRICS: Gestational criteria: Estimated Date of Delivery: 11/09/21 by early ultrasound now at [redacted]w[redacted]d Previous Scans:4 BIOPHYSICAL PROFILE:  COMMENTS GROSS BODY MOVEMENT                 2  TONE                2  RESPIRATIONS                2  AMNIOTIC FLUID                2                                                          SCORE:  8/8 (Note: NST was not performed as part of this antepartum testing)  DOPPLER FLOW STUDIES: UMBILICAL ARTERY RI RATIOS:    .74,.66,.64,.63=77% ANATOMICAL SURVEY                                                                            COMMENTS CEREBRAL VENTRICLES yes normal  CHOROID PLEXUS yes normal  CEREBELLUM yes normal  CISTERNA MAGNA  Yes  normal   CAVUM SEPTI PELLUCIDI YES NORMAL              NOSE/LIP yes normal  FACIAL PROFILE yes normal  4 CHAMBERED HEART yes normal  OUTFLOW TRACTS YES normaL  3VV YES NORMAL  3VTV YES NORMAL  SITUS YES NORMAL      DIAPHRAGM yes normal  STOMACH yes normal  RENAL REGION yes normal  BLADDER yes normal          3 VESSEL CORD yes normal  SPINE yes normal          GENITALIA yes normal female     SUSPECTED ABNORMALITIES:  no QUALITY OF SCAN: satisfactory TECHNICIAN COMMENTS: [place] [id number] wks,cephalic,anterior placenta gr 2,BPP 8/8,FHR 150 bpm,RI .74,.66,.64,.63=77%,AFI 13 cm A copy of this report including all images has been saved and backed up to a second source for retrieval if needed. All measures and details of the anatomical scan, placentation, fluid volume and pelvic anatomy are  contained in that report. Amber Heide Guile 10/07/2021 11:38 AM Clinical Impression and recommendations: I have reviewed the sonogram results above, combined with the patient's current clinical course, below are my impressions and any appropriate recommendations for management based on the sonographic findings. 1.  G1P0 Estimated Date of Delivery: 11/09/21 by serial sonographic evaluations 2.  Fetal sonographic surveillance findings: a). Normal fluid volume b). Normal antepartum fetal assessment with BPP 8/8 c). Normal fetal Doppler ratios with consistent diastolic flow:  [id number] 3.  Normal general sonographic findings Recommend continued prenatal evaluations and care based on this sonogram and as clinically indicated from the patient's clinical course. Annalee Genta 10/08/2021 6:10 PM PHYSICIAN ATTESTATION FOR FORMAL SONOGRAM REVIEW: I have reviewed the  report, as well as, the associated images for this study and agree with the technician comments and clinical impression/  Recommendations which are noted above.  Jacelyn Grip MD Attending Physician for the [facility] for [facility] 10/11/2021 6:26 PM  [place] UA Cord  Doppler  Result Date: 10/11/2021 Table formatting from the original result was not included. Images from the original result were not included.  ..an [employer] Diplomatic Services operational officer) accredited practice Center for [facility] @ [place] [address] Ordering Provider: Roma Schanz, CNM FOLLOW UP SONOGRAM CARLISS BLATT is in the office for a follow up sonogram for EFW,BPP and cord dopplers. She is a 21 y.o. year old G1P0 with Estimated Date of Delivery: 11/09/21 by early ultrasound now at  [redacted]w[redacted]d weeks gestation. Thus far the pregnancy has been complicated by small for dates. GESTATION: SINGLETON PRESENTATION: cephalic FETAL ACTIVITY:          Heart rate         146          The fetus is active. AMNIOTIC FLUID: The amniotic  fluid volume is  normal, 10.1 cm. PLACENTA LOCALIZATION:  anterior GRADE 2 CERVIX: Limited view ADNEXA: The ovaries are normal. GESTATIONAL AGE AND  BIOMETRICS: Gestational criteria: Estimated Date of Delivery: 11/09/21 by early ultrasound now at [redacted]w[redacted]d Previous Scans:3          BIPARIETAL DIAMETER           8.20 cm         32+6 weeks   14.6% HEAD CIRCUMFERENCE           30.57 cm         34 weeks   12% ABDOMINAL CIRCUMFERENCE           27.72 cm         31+5 weeks   3.4% FEMUR LENGTH           6.12 cm         31+5 weeks   2.1%                                                       AVERAGE EGA(BY THIS SCAN):  32+4 weeks                                                 ESTIMATED FETAL WEIGHT:       1902  grams, 4.8 % BIOPHYSICAL PROFILE:                                                                                                      COMMENTS GROSS BODY MOVEMENT                  2  TONE                2  RESPIRATIONS                2  AMNIOTIC FLUID  2                                                          SCORE:  8/8 (Note: NST was not performed as part of this antepartum testing)  DOPPLER FLOW STUDIES: UMBILICAL ARTERY RI RATIOS:    .64,.69,.73,.62=84% ANATOMICAL SURVEY                                                                            COMMENTS CEREBRAL VENTRICLES yes normal  CHOROID PLEXUS yes normal  CEREBELLUM yes normal  CISTERNA MAGNA  Yes  normal   CAVUM SEPTI PELLUCIDI YES NORMAL  NUCHAL REGION yes normal  ORBITS yes normal  NASAL BONE yes normal  NOSE/LIP yes normal  FACIAL PROFILE yes normal  4 CHAMBERED HEART yes normal  OUTFLOW TRACTS YES normaL  3VV YES NORMAL  3VTV YES NORMAL  SITUS YES NORMAL      DIAPHRAGM yes normal  STOMACH yes normal  RENAL REGION yes normal  BLADDER yes normal          3 VESSEL CORD yes normal  SPINE yes normal          GENITALIA yes normal female     SUSPECTED ABNORMALITIES:  yes,EFW 4.8% QUALITY OF SCAN: satisfactory TECHNICIAN COMMENTS: [place] 0000000  wks,cephalic,BPP [id number] 123456 bpm,anterior placenta gr 2,AFI 10.1 cm,RI .64,.69,.73,.62=84%,EFW 1902 g 4.8%,Kim Booker discussed results with patient A copy of this report including all images has been saved and backed up to a second source for retrieval if needed. All measures and details of the anatomical scan, placentation, fluid volume and pelvic anatomy are contained in that report. Amber Heide Guile 09/30/2021 3:09 PM Clinical Impression and recommendations: I have reviewed the sonogram results above, combined with the patient's current clinical course, below are my impressions and any appropriate recommendations for management based on the sonographic findings. 1.  G1P0 Estimated Date of Delivery: 11/09/21 by serial sonographic evaluations 2.  Fetal sonographic surveillance findings: a). Normal fluid volume b). Normal antepartum fetal assessment with BPP 8/8 c). Normal fetal Doppler ratios with consistent diastolic flow:  123456 d). Normal growth percentile with appropriate interval growth:  4.8% 3.  Normal general sonographic findings Recommend continued prenatal evaluations and care based on this sonogram and as clinically indicated from the patient's clinical course. Annalee Genta 10/01/2021 10:19 AM PHYSICIAN ATTESTATION FOR FORMAL SONOGRAM REVIEW: I have reviewed the  report, as well as, the associated images for this study and agree with the technician comments and clinical impression/  Recommendations which are noted above.  Jacelyn Grip MD Attending Physician for the [facility] for [facility] 10/11/2021 6:24 PM   [place] MFM FETAL BPP WO NON STRESS  Result Date: 10/15/2021 ----------------------------------------------------------------------  OBSTETRICS REPORT                       (Signed Final 10/15/2021 05:15 pm) ---------------------------------------------------------------------- Patient Info  ID #:       [id number]  D.O.B.:  [birth year] (20 yrs)  Name:        NORRENE WHALLEY [facility]                Visit Date: 10/15/2021 02:35 pm ---------------------------------------------------------------------- Performed By  Attending:        Sander Nephew      Ref. Address:     [facility]                    MD                                                             OB/GYN                                                             [place]                                                             [place]  Performed By:     Jeanene Erb BS,      Location:         [facility]                    RDMS                                     Fetal Care at                                                             [place] for                                                             Women  Referred By:      Roma Schanz CNM ---------------------------------------------------------------------- Orders  #  Description  Code        Ordered By  1  [place] MFM OB DETAIL +14 WK               [id number]    Shawna Clamp  2  [place] MFM FETAL BPP WO NON               76819.01    KIMBERLY BOOKER     STRESS  3  [place] MFM UA CORD DOPPLER                76820.02    [facility] BOOKER ----------------------------------------------------------------------  #  Order #                     Accession #                Episode #  1  [id number]                   [id number]                 [id number]  2  [id number]                   [id number]                 [id number]  3  [id number]                   [id number]                 [id number] ---------------------------------------------------------------------- Indications  Maternal care for known or suspected poor      O36.5930  fetal growth, third trimester, not applicable or  unspecified IUGR  Encounter for antenatal screening for          Z36.3  malformations  Seizure disorder(No Meds)                      O99.350 G40.909  Hyperthyroid (No  Meds)                         O99.280 E05.90  [redacted] weeks gestation of pregnancy                Z3A.36  Low Risk NIPS(Negative Horizon)(Negative  AFP) ---------------------------------------------------------------------- Fetal Evaluation  Num Of Fetuses:         1  Fetal Heart Rate(bpm):  160  Cardiac Activity:       Observed  Presentation:           Cephalic  Placenta:               Anterior  P. Cord Insertion:      Visualized  Amniotic Fluid  AFI FV:      Subjectively low-normal  AFI Sum(cm)     %Tile       Largest Pocket(cm)  7.7             6           3.3  RUQ(cm)       RLQ(cm)       LUQ(cm)        LLQ(cm)  3.3           1.1           2.6            0.8 ---------------------------------------------------------------------- Biophysical Evaluation  Amniotic F.V:   Pocket => 2 cm  F. Tone:        Observed  F. Movement:    Observed                   Score:          8/8  F. Breathing:   Observed ---------------------------------------------------------------------- Biometry  BPD:      83.1  mm     G. Age:  33w 3d        2.3  %    CI:        77.32   %    70 - 86                                                          FL/HC:      21.6   %    20.1 - 22.1  HC:      299.2  mm     G. Age:  33w 1d        < 1  %    HC/AC:      1.01        0.93 - 1.11  AC:      294.9  mm     G. Age:  33w 3d        2.5  %    FL/BPD:     77.9   %    71 - 87  FL:       64.7  mm     G. Age:  33w 3d        1.3  %    FL/AC:      21.9   %    20 - 24  HUM:      57.3  mm     G. Age:  33w 2d          9  %  Est. FW:    2189  gm    4 lb 13 oz     2.6  % ---------------------------------------------------------------------- OB History  Gravidity:    1 ---------------------------------------------------------------------- Gestational Age  LMP:           39w 1d        Date:  01/14/21                 EDD:   10/21/21  U/S Today:     33w 3d                                        EDD:   11/30/21  Best:          36w 3d     Det. ByLoman Chroman          EDD:   11/09/21                                      (03/18/21) ---------------------------------------------------------------------- Anatomy  Cranium:               Appears normal         Aortic Arch:            Not  well visualized  Cavum:                 Appears normal         Ductal Arch:            Not well visualized  Ventricles:            Appears normal         Diaphragm:              Appears normal  Choroid Plexus:        Appears normal         Stomach:                Appears normal, left                                                                        sided  Cerebellum:            Not well visualized    Abdomen:                Appears normal  Posterior Fossa:       Not well visualized    Abdominal Wall:         Not well visualized  Nuchal Fold:           Not applicable ([id number]    Cord Vessels:           Appears normal ([redacted]                         wks GA)                                        vessel cord)  Face:                  Orbits appear          Kidneys:                Appear normal                         normal  Lips:                  Not well visualized    Bladder:                Appears normal  Thoracic:              Appears normal         Spine:                  Not well visualized  Heart:                 Appears normal         Upper Extremities:      Not well visualized                         (4CH, axis, and  situs)  RVOT:                  Appears normal         Lower Extremities:      Appears normal  LVOT:                  Appears normal  Other:  Fetus appears to be a female. Nasal bone visualized. Technically          difficult due to advanced GA and fetal position. ---------------------------------------------------------------------- Doppler - Fetal Vessels  Umbilical Artery   S/D     %tile      RI    %tile      PI    %tile     PSV    ADFV    RDFV                                                     (cm/s)   2.92       80    0.66       85       1       82     24.11      No      No ---------------------------------------------------------------------- Impression  Follow up growth due to severe fetal growth restriction.  Normal interval growth with measurements consistent with  FGR 2.3%  Good fetal movement and amniotic fluid volume  Biophysical profile 8/8 was seen  Normal UA dopplers without evidence of AEDF or REDF.  She was counseled regarding continue daily kick counts.  She is scheduled for IOL on Friday. ----------------------------------------------------------------------               Sander Nephew, MD Electronically Signed Final Report   10/15/2021 05:15 pm ----------------------------------------------------------------------  [place] MFM OB DETAIL +14 WK  Result Date: 10/15/2021 ----------------------------------------------------------------------  OBSTETRICS REPORT                       (Signed Final 10/15/2021 05:15 pm) ---------------------------------------------------------------------- Patient Info  ID #:       [id number]                          D.O.B.:  June 06, 2001 (20 yrs)  Name:       DRUE HOLLE [facility]                Visit Date: 10/15/2021 02:35 pm ---------------------------------------------------------------------- Performed By  Attending:        Sander Nephew      Ref. Address:     [facility]                    MD                                                             OB/GYN  [place]                                                             [place]  Performed By:     Jeanene Erb BS,      Location:         [facility]                    RDMS                                     Fetal Care at                                                             [place] for                                                             Women  Referred By:      Roma Schanz CNM  ---------------------------------------------------------------------- Orders  #  Description                           Code        Ordered By  1  [place] MFM OB DETAIL +14 [place]               76811.01    KIMBERLY BOOKER  2  [place] MFM FETAL BPP WO NON               76819.01    KIMBERLY BOOKER     STRESS  3  [place] MFM UA CORD DOPPLER                76820.02    [facility] BOOKER ----------------------------------------------------------------------  #  Order #                     Accession #                Episode #  1  [id number]                   [id number]                 [id number]  2  [id number]  [id number]                 [id number]  3  [id number]                   [id number]                 [id number] ---------------------------------------------------------------------- Indications  Maternal care for known or suspected poor      O36.5930  fetal growth, third trimester, not applicable or  unspecified IUGR  Encounter for antenatal screening for          Z36.3  malformations  Seizure disorder(No Meds)                      O99.350 G40.909  Hyperthyroid (No Meds)                         O99.280 E05.90  [redacted] weeks gestation of pregnancy                Z3A.36  Low Risk NIPS(Negative Horizon)(Negative  AFP) ---------------------------------------------------------------------- Fetal Evaluation  Num Of Fetuses:         1  Fetal Heart Rate(bpm):  160  Cardiac Activity:       Observed  Presentation:           Cephalic  Placenta:               Anterior  P. Cord Insertion:      Visualized  Amniotic Fluid  AFI FV:      Subjectively low-normal  AFI Sum(cm)     %Tile       Largest Pocket(cm)  7.7             6           3.3  RUQ(cm)       RLQ(cm)       LUQ(cm)        LLQ(cm)  3.3           1.1           2.6            0.8 ---------------------------------------------------------------------- Biophysical Evaluation  Amniotic F.V:   Pocket => 2 cm             F. Tone:        Observed  F. Movement:    Observed                   Score:           8/8  F. Breathing:   Observed ---------------------------------------------------------------------- Biometry  BPD:      83.1  mm     G. Age:  33w 3d        2.3  %    CI:        77.32   %    70 - 86                                                          FL/HC:      21.6   %    20.1 - 22.1  HC:      299.2  mm     G. Age:  33w 1d        <  1  %    HC/AC:      1.01        0.93 - 1.11  AC:      294.9  mm     G. Age:  33w 3d        2.5  %    FL/BPD:     77.9   %    71 - 87  FL:       64.7  mm     G. Age:  33w 3d        1.3  %    FL/AC:      21.9   %    20 - 24  HUM:      57.3  mm     G. Age:  33w 2d          9  %  Est. FW:    2189  gm    4 lb 13 oz     2.6  % ---------------------------------------------------------------------- OB History  Gravidity:    1 ---------------------------------------------------------------------- Gestational Age  LMP:           39w 1d        Date:  01/14/21                 EDD:   10/21/21  U/S Today:     33w 3d                                        EDD:   11/30/21  Best:          36w 3d     Det. By:  Loman Chroman         EDD:   11/09/21                                      (03/18/21) ---------------------------------------------------------------------- Anatomy  Cranium:               Appears normal         Aortic Arch:            Not well visualized  Cavum:                 Appears normal         Ductal Arch:            Not well visualized  Ventricles:            Appears normal         Diaphragm:              Appears normal  Choroid Plexus:        Appears normal         Stomach:                Appears normal, left                                                                        sided  Cerebellum:  Not well visualized    Abdomen:                Appears normal  Posterior Fossa:       Not well visualized    Abdominal Wall:         Not well visualized  Nuchal Fold:           Not applicable (Q000111Q    Cord Vessels:           Appears normal ([redacted]                         wks  GA)                                        vessel cord)  Face:                  Orbits appear          Kidneys:                Appear normal                         normal  Lips:                  Not well visualized    Bladder:                Appears normal  Thoracic:              Appears normal         Spine:                  Not well visualized  Heart:                 Appears normal         Upper Extremities:      Not well visualized                         (4CH, axis, and                         situs)  RVOT:                  Appears normal         Lower Extremities:      Appears normal  LVOT:                  Appears normal  Other:  Fetus appears to be a female. Nasal bone visualized. Technically          difficult due to advanced GA and fetal position. ---------------------------------------------------------------------- Doppler - Fetal Vessels  Umbilical Artery   S/D     %tile      RI    %tile      PI    %tile     PSV    ADFV    RDFV                                                     (cm/s)   2.92  80    0.66       85       1       82    24.11      No      No ---------------------------------------------------------------------- Impression  Follow up growth due to severe fetal growth restriction.  Normal interval growth with measurements consistent with  FGR 2.3%  Good fetal movement and amniotic fluid volume  Biophysical profile 8/8 was seen  Normal UA dopplers without evidence of AEDF or REDF.  She was counseled regarding continue daily kick counts.  She is scheduled for IOL on Friday. ----------------------------------------------------------------------               Sander Nephew, MD Electronically Signed Final Report   10/15/2021 05:15 pm ----------------------------------------------------------------------  [place] MFM UA CORD DOPPLER  Result Date: 10/15/2021 ----------------------------------------------------------------------  OBSTETRICS REPORT                       (Signed Final 10/15/2021  05:15 pm) ---------------------------------------------------------------------- Patient Info  ID #:       [id number]                          D.O.B.:  2000-11-12 (20 yrs)  Name:       DI BAGOT [facility]                Visit Date: 10/15/2021 02:35 pm ---------------------------------------------------------------------- Performed By  Attending:        Sander Nephew      Ref. Address:     [facility]                    MD                                                             OB/GYN                                                             [place]                                                             [place]  Performed By:     Jeanene Erb BS,      Location[facility]         [facility]                    RDMS  [facility] at                                                             [place] [facility]                                                             Women  Referred By:      Roma Schanz CNM ---------------------------------------------------------------------- Orders  #  Description                           Code        Ordered By  1  [place] MFM OB DETAIL +14 WK               76811.01    KIMBERLY BOOKER  2  [place] MFM FETAL BPP WO NON               76819.01    KIMBERLY BOOKER     STRESS  3  [place] MFM UA CORD DOPPLER                76820.02    [facility] BOOKER ----------------------------------------------------------------------  #  Order #                     Accession #                Episode #  1  [id number]                   [id number]                 [id number]  2  [id number]                   [id number]                 [id number]  3  [id number]                   [id number]                 [id number] ---------------------------------------------------------------------- Indications  Maternal care for known or suspected poor      O36.5930  fetal growth, third trimester, not  applicable or  unspecified IUGR  Encounter for antenatal screening for          Z36.3  malformations  Seizure disorder(No Meds)                      O99.350 G40.909  Hyperthyroid (No Meds)                         O99.280 E05.90  [redacted] weeks gestation of pregnancy                Z3A.36  Low Risk NIPS(Negative Horizon)(Negative  AFP) ---------------------------------------------------------------------- Fetal Evaluation  Num Of Fetuses:  1  Fetal Heart Rate(bpm):  160  Cardiac Activity:       Observed  Presentation:           Cephalic  Placenta:               Anterior  P. Cord Insertion:      Visualized  Amniotic Fluid  AFI FV:      Subjectively low-normal  AFI Sum(cm)     %Tile       Largest Pocket(cm)  7.7             6           3.3  RUQ(cm)       RLQ(cm)       LUQ(cm)        LLQ(cm)  3.3           1.1           2.6            0.8 ---------------------------------------------------------------------- Biophysical Evaluation  Amniotic F.V:   Pocket => 2 cm             F. Tone:        Observed  F. Movement:    Observed                   Score:          8/8  F. Breathing:   Observed ---------------------------------------------------------------------- Biometry  BPD:      83.1  mm     G. Age:  33w 3d        2.3  %    CI:        77.32   %    70 - 86                                                          FL/HC:      21.6   %    20.1 - 22.1  HC:      299.2  mm     G. Age:  33w 1d        < 1  %    HC/AC:      1.01        0.93 - 1.11  AC:      294.9  mm     G. Age:  33w 3d        2.5  %    FL/BPD:     77.9   %    71 - 87  FL:       64.7  mm     G. Age:  33w 3d        1.3  %    FL/AC:      21.9   %    20 - 24  HUM:      57.3  mm     G. Age:  33w 2d          9  %  Est. FW:    2189  gm    4 lb 13 oz     2.6  % ---------------------------------------------------------------------- OB History  Gravidity:    1 ---------------------------------------------------------------------- Gestational Age  LMP:           39w 1d  Date:  01/14/21                 EDD:   10/21/21  U/S Today:     33w 3d                                        EDD:   11/30/21  Best:          36w 3d     Det. ByLoman Chroman         EDD:   11/09/21                                      (03/18/21) ---------------------------------------------------------------------- Anatomy  Cranium:               Appears normal         Aortic Arch:            Not well visualized  Cavum:                 Appears normal         Ductal Arch:            Not well visualized  Ventricles:            Appears normal         Diaphragm:              Appears normal  Choroid Plexus:        Appears normal         Stomach:                Appears normal, left                                                                        sided  Cerebellum:            Not well visualized    Abdomen:                Appears normal  Posterior Fossa:       Not well visualized    Abdominal Wall:         Not well visualized  Nuchal Fold:           Not applicable (Q000111Q    Cord Vessels:           Appears normal ([redacted]                         wks GA)                                        vessel cord)  Face:                  Orbits appear          Kidneys:                Appear normal  normal  Lips:                  Not well visualized    Bladder:                Appears normal  Thoracic:              Appears normal         Spine:                  Not well visualized  Heart:                 Appears normal         Upper Extremities:      Not well visualized                         (4CH, axis, and                         situs)  RVOT:                  Appears normal         Lower Extremities:      Appears normal  LVOT:                  Appears normal  Other:  Fetus appears to be a female. Nasal bone visualized. Technically          difficult due to advanced GA and fetal position. ---------------------------------------------------------------------- Doppler - Fetal Vessels  Umbilical Artery   S/D      %tile      RI    %tile      PI    %tile     PSV    ADFV    RDFV                                                     (cm/s)   2.92       80    0.66       85       1       82    24.11      No      No ---------------------------------------------------------------------- Impression  Follow up growth due to severe fetal growth restriction.  Normal interval growth with measurements consistent with  FGR 2.3%  Good fetal movement and amniotic fluid volume  Biophysical profile 8/8 was seen  Normal UA dopplers without evidence of AEDF or REDF.  She was counseled regarding continue daily kick counts.  She is scheduled for IOL on Friday. ----------------------------------------------------------------------               Sander Nephew, MD Electronically Signed Final Report   10/15/2021 05:15 pm ----------------------------------------------------------------------    Assessment and Plan: Patient Active Problem List   Diagnosis Date Noted   Acute appendicitis complicating pregnancy Q000111Q   Fetal growth restriction antepartum 10/01/2021   Asymptomatic bacteriuria during pregnancy 05/06/2021   Marijuana use 05/06/2021   Supervision of high-risk pregnancy 05/01/2021   Hyperthyroidism 05/01/2021   HNP (herniated nucleus pulposus), lumbar 12/28/2020   Rectal bleeding 08/13/2020   MDD (major depressive disorder), recurrent severe, without psychosis ([place]) 02/11/2016   Disruptive mood dysregulation disorder ([place]) 06/10/2014  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 More likely though that she will go into spontaneous labor post op Already planned for IOL in two days due to IUGR fetus, EFW 2.6%, AC 2.5%, on  [place] two days prior Admit to Antenatal Routine antenatal care Case discussed w Dr. Kennon Rounds who is in agreement with plan  Clarnce Flock, MD/MPH Attending Family Medicine Physician, [facility] for [facility], [place]

## 2021-10-17 NOTE — Transfer of Care (Signed)
Immediate Anesthesia Transfer of Care Note  Patient: Sandy Carter  Procedure(s) Performed: APPENDECTOMY LAPAROSCOPIC (Abdomen)  Patient Location: PACU  Anesthesia Type:General  Level of Consciousness: awake and alert   Airway & Oxygen Therapy: Patient Spontanous Breathing  Post-op Assessment: Report given to RN and Post -op Vital signs reviewed and stable  Post vital signs: Reviewed and stable  Last Vitals:  Vitals Value Taken Time  BP 116/54 10/17/21 1151  Temp    Pulse 115 10/17/21 1156  Resp 18 10/17/21 1156  SpO2 100 % 10/17/21 1156  Vitals shown include unvalidated device data.  Last Pain:  Vitals:   10/17/21 1024  TempSrc:   PainSc: 5          Complications: No notable events documented.

## 2021-10-17 NOTE — MAU Note (Addendum)
Report given to Hansel Starling, RN in [employer]. Informed of IV location as well as most recent VS. General Surgery at patients bedside during phone conversation with patients nurse. Informed Consent to be signed.  Hansel Starling, RN, stated to [employer] patients IV and send patient with her Zosyn to Short Stay if received from Pharmacy. RN requested for pre-procedure checklist to be filled out as much as is applicable. RN stated the OR will be sending transport for the patient.

## 2021-10-17 NOTE — Anesthesia Procedure Notes (Signed)
Procedure Name: Intubation Date/Time: 10/17/2021 10:39 AM Performed by: Dorann Lodge, CRNA Pre-anesthesia Checklist: Patient identified, Emergency Drugs available, Suction available and Patient being monitored Patient Re-evaluated:Patient Re-evaluated prior to induction Oxygen Delivery Method: Circle System Utilized Preoxygenation: Pre-oxygenation with 100% oxygen Induction Type: IV induction, Rapid sequence and Cricoid Pressure applied Laryngoscope Size: Mac and 3 Grade View: Grade I Tube type: Oral Tube size: 6.5 mm Number of attempts: 1 Airway Equipment and Method: Stylet Placement Confirmation: ETT inserted through vocal cords under direct vision, positive ETCO2 and breath sounds checked- equal and bilateral Secured at: 21 cm Tube secured with: Tape Dental Injury: Teeth and Oropharynx as per pre-operative assessment

## 2021-10-18 ENCOUNTER — Encounter (HOSPITAL_COMMUNITY): Payer: Self-pay | Admitting: Surgery

## 2021-10-18 ENCOUNTER — Other Ambulatory Visit: Payer: Medicaid Other

## 2021-10-18 DIAGNOSIS — Z3A37 37 weeks gestation of pregnancy: Secondary | ICD-10-CM

## 2021-10-18 LAB — SURGICAL PATHOLOGY

## 2021-10-18 MED ORDER — ONDANSETRON HCL 4 MG/2ML IJ SOLN
4.0000 mg | Freq: Four times a day (QID) | INTRAMUSCULAR | Status: DC | PRN
  Administered 2021-10-18: 4 mg via INTRAVENOUS
  Filled 2021-10-18: qty 2

## 2021-10-18 NOTE — Progress Notes (Addendum)
[facility] Progress Note  1 Day Post-Op  Subjective: CC:  C/o abdominal discomfort in her upper 2 incisions, temporarily relieved by IV dilaudid and PO Norco. Tolerating PO. Mobilizing some in her room.   Patient states she is to be induced at midnight.  Objective: Vital signs in last 24 hours: Temp:  [98 F (36.7 C)-99 F (37.2 C)] 98 F (36.7 C) (02/10 0752) Pulse Rate:  [72-127] 104 (02/10 0752) Resp:  [13-21] 16 (02/10 0752) BP: (99-126)/(41-87) 107/65 (02/10 0752) SpO2:  [95 %-100 %] 100 % (02/10 0752) Weight:  [62.1 kg] 62.1 kg (02/09 0955) Last BM Date: 10/16/21  Intake/Output from previous day: 02/09 0701 - 02/10 0700 In: 1511.5 [I.V.:1461.5; IV Piggyback:50] Out: [id number] [Urine:1050; Blood:5] Intake/Output this shift: No intake/output data recorded.  PE: Gen:  Alert, NAD, pleasant Pulm:  Normal effort ORA Abd: gravid, Soft, appropriately tender without guarding or rebound tenderness, incisions c/d/i without drainage.  Skin: warm and dry, no rashes  Psych: A&Ox3   Lab Results:  Recent Labs    10/16/21 2326 10/17/21 1535  WBC 18.7* 19.5*  HGB 9.9* 10.1*  HCT 30.5* 30.4*  PLT 261 260   BMET Recent Labs    10/16/21 2326  NA 131*  K 3.8  CL 101  CO2 20*  GLUCOSE 127*  BUN 10  CREATININE 0.71  CALCIUM 8.7*   PT/INR No results for input(s): LABPROT, INR in the last 72 hours. CMP     Component Value Date/Time   NA 131 (L) 10/16/2021 2326   K 3.8 10/16/2021 2326   CL 101 10/16/2021 2326   CO2 20 (L) 10/16/2021 2326   GLUCOSE 127 (H) 10/16/2021 2326   BUN 10 10/16/2021 2326   CREATININE 0.71 10/16/2021 2326   CALCIUM 8.7 (L) 10/16/2021 2326   PROT 6.2 (L) 10/16/2021 2326   ALBUMIN 2.7 (L) 10/16/2021 2326   AST 23 10/16/2021 2326   ALT 15 10/16/2021 2326   ALKPHOS 110 10/16/2021 2326   BILITOT 0.6 10/16/2021 2326   GFRNONAA >60 10/16/2021 2326   GFRAA >60 02/20/2020 0002   Lipase     Component Value Date/Time   LIPASE 29  06/24/2021 2124       Studies/Results: MR PELVIS WO CONTRAST  Result Date: 10/17/2021 CLINICAL DATA:  Acute abdominal pain of the RIGHT lower quadrant in a 21 year old female at 61 weeks of pregnancy. EXAM: MRI ABDOMEN AND PELVIS WITHOUT CONTRAST TECHNIQUE: Multiplanar multisequence MR imaging of the abdomen and pelvis was performed. No intravenous contrast was administered. COMPARISON:  None FINDINGS: COMBINED FINDINGS FOR BOTH MR ABDOMEN AND PELVIS Lower chest: Incidental imaging of the lung bases is unremarkable by MRI with limited assessment. Hepatobiliary: No biliary duct dilation. No pericholecystic stranding, sludge in the gallbladder. Limited assessment of the liver is unremarkable. Pancreas: Pancreas with normal intrinsic T1 signal. No signs of peripancreatic inflammation. Spleen:  Normal in size and contour.  No focal lesion. Adrenals/Urinary Tract: Adrenal glands are normal. There is mild to moderate RIGHT-sided hydroureteronephrosis in the setting of a gravid uterus with compression upon the distal RIGHT ureter at the level of the sacral promontory, likely physiologic dilation of the upper RIGHT urinary tract in the setting of this late stage of pregnancy. Stomach/Bowel: Blind-ending tubular structure with dilation, internal heterogeneity and marked surrounding edema extending inferiorly from the cecal tip, does not appear to be frankly retrocecal. Trace fluid in the retroperitoneum adjacent to the appendix is nonfocal. Edema tracks along the RIGHT flank. Structure in  the RIGHT lower quadrant arises from the cecum. Maximal caliber 13 mm. Vascular/Lymphatic: Vascular structures not well assessed given lack of intravenous contrast. No gross adenopathy in the abdomen or in the pelvis. Reproductive: Gravid uterus. Fetus in vertex position, anterior placenta. Study not performed for fetal evaluation. The ovaries are grossly normal is. Other: Edema tracks along the RIGHT flank in and is present in the  RIGHT lower quadrant. Musculoskeletal: No suspicious bone lesions identified. IMPRESSION: 1. Findings of acute appendicitis with signs of marked inflammation about the RIGHT lower quadrant. No definitive signs of rupture though inflammation tracks throughout the RIGHT lower quadrant extending along the RIGHT paracolic gutter and posterior to the cecum and ascending colon, possibility of early or impending rupture is considered for this reason. There is no free fluid in the pelvis. 2. Moderate RIGHT-sided ureteral dilation in the setting of uterine compression without signs of perinephric stranding, compatible with physiologic dilation of the RIGHT urinary tract. 3. Gravid uterus, feet is in vertex position. Study not performed for fetal evaluation. 4. Gallbladder sludge. These results were called by telephone at the time of interpretation on 10/17/2021 at 8:31 am to provider Dr. Gaynelle Adu, Who verbally acknowledged these results. Electronically Signed   By: Zetta Bills M.D.   On: 10/17/2021 08:40   MR ABDOMEN WO CONTRAST  Result Date: 10/17/2021 CLINICAL DATA:  Acute abdominal pain of the RIGHT lower quadrant in a 21 year old female at 72 weeks of pregnancy. EXAM: MRI ABDOMEN AND PELVIS WITHOUT CONTRAST TECHNIQUE: Multiplanar multisequence MR imaging of the abdomen and pelvis was performed. No intravenous contrast was administered. COMPARISON:  None FINDINGS: COMBINED FINDINGS FOR BOTH MR ABDOMEN AND PELVIS Lower chest: Incidental imaging of the lung bases is unremarkable by MRI with limited assessment. Hepatobiliary: No biliary duct dilation. No pericholecystic stranding, sludge in the gallbladder. Limited assessment of the liver is unremarkable. Pancreas: Pancreas with normal intrinsic T1 signal. No signs of peripancreatic inflammation. Spleen:  Normal in size and contour.  No focal lesion. Adrenals/Urinary Tract: Adrenal glands are normal. There is mild to moderate RIGHT-sided hydroureteronephrosis in the  setting of a gravid uterus with compression upon the distal RIGHT ureter at the level of the sacral promontory, likely physiologic dilation of the upper RIGHT urinary tract in the setting of this late stage of pregnancy. Stomach/Bowel: Blind-ending tubular structure with dilation, internal heterogeneity and marked surrounding edema extending inferiorly from the cecal tip, does not appear to be frankly retrocecal. Trace fluid in the retroperitoneum adjacent to the appendix is nonfocal. Edema tracks along the RIGHT flank. Structure in the RIGHT lower quadrant arises from the cecum. Maximal caliber 13 mm. Vascular/Lymphatic: Vascular structures not well assessed given lack of intravenous contrast. No gross adenopathy in the abdomen or in the pelvis. Reproductive: Gravid uterus. Fetus in vertex position, anterior placenta. Study not performed for fetal evaluation. The ovaries are grossly normal is. Other: Edema tracks along the RIGHT flank in and is present in the RIGHT lower quadrant. Musculoskeletal: No suspicious bone lesions identified. IMPRESSION: 1. Findings of acute appendicitis with signs of marked inflammation about the RIGHT lower quadrant. No definitive signs of rupture though inflammation tracks throughout the RIGHT lower quadrant extending along the RIGHT paracolic gutter and posterior to the cecum and ascending colon, possibility of early or impending rupture is considered for this reason. There is no free fluid in the pelvis. 2. Moderate RIGHT-sided ureteral dilation in the setting of uterine compression without signs of perinephric stranding, compatible with physiologic dilation of the  RIGHT urinary tract. 3. Gravid uterus, feet is in vertex position. Study not performed for fetal evaluation. 4. Gallbladder sludge. These results were called by telephone at the time of interpretation on 10/17/2021 at 8:31 am to provider Dr. Gaynelle Adu, Who verbally acknowledged these results. Electronically Signed   By:  Zetta Bills M.D.   On: 10/17/2021 08:40    Anti-infectives: Anti-infectives (From admission, onward)    Start     Dose/Rate Route Frequency Ordered Stop   10/17/21 0845  piperacillin-tazobactam (ZOSYN) IVPB 3.375 g  Status:  Discontinued        3.375 g 12.5 mL/hr over 240 Minutes Intravenous Every 8 hours 10/17/21 0836 10/17/21 1348        Assessment/Plan  POD#1 s/p laparoscopic appendectomy 0000000 for uncomplicated acute appendicitis  - afebrile, vitals stable, tolerating PO - patient is stable for discharge from a general surgery standpoint whenever she is cleared by OB/Gyn. Follow up appointment and discharge instructions provided. We discussed post-operative care, activity, bathing, etc. General surgery will sign off. Please call as needed.    LOS: 1 day    This care required moderate level of medical decision making.   Obie Dredge, PA-C [facility] Please see Amion for pager number during day hours 7:00am-4:30pm

## 2021-10-18 NOTE — Discharge Instructions (Addendum)
[facility] LAPAROSCOPIC SURGERY: POST OP INSTRUCTIONS Always review your discharge instruction sheet given to you by the facility where your surgery was performed. IF YOU HAVE DISABILITY OR FAMILY LEAVE FORMS, YOU MUST BRING THEM TO THE OFFICE FOR PROCESSING.   DO NOT GIVE THEM TO YOUR DOCTOR.  PAIN CONTROL  First take acetaminophen (Tylenol) AND/or ibuprofen (Advil) to control your pain after surgery.  Follow directions on package.  Taking acetaminophen (Tylenol) and/or ibuprofen (Advil) regularly after surgery will help to control your pain and lower the amount of prescription pain medication you may need.  You should not take more than 3,000 mg (3 grams) of acetaminophen (Tylenol) in 24 hours.  You should not take ibuprofen (Advil), aleve, motrin, naprosyn or other NSAIDS if you have a history of stomach ulcers or chronic kidney disease.  A prescription for pain medication may be given to you upon discharge.  Take your pain medication as prescribed, if you still have uncontrolled pain after taking acetaminophen (Tylenol) or ibuprofen (Advil). Use ice packs to help control pain. If you need a refill on your pain medication, please contact your pharmacy.  They will contact our office to request authorization. Prescriptions will not be filled after 5pm or on week-ends.  HOME MEDICATIONS Take your usually prescribed medications unless otherwise directed.  DIET You should follow a light diet the first few days after arrival home.  Be sure to include lots of fluids daily. Avoid fatty, fried foods.   CONSTIPATION It is common to experience some constipation after surgery and if you are taking pain medication.  Increasing fluid intake and taking a stool softener (such as Colace) will usually help or prevent this problem from occurring.  A mild laxative (Milk of Magnesia or Miralax) should be taken according to package instructions if there are no bowel movements after 48  hours.  WOUND/INCISION CARE Most patients will experience some swelling and bruising in the area of the incisions.  Ice packs will help.  Swelling and bruising can take several days to resolve.  Unless discharge instructions indicate otherwise, follow guidelines below  STERI-STRIPS - you may remove your outer bandages 48 hours after surgery, and you may shower at that time.  You have steri-strips (small skin tapes) in place directly over the incision.  These strips should be left on the skin for 7-10 days.   DERMABOND/SKIN GLUE - you may shower in 24 hours.  The glue will flake off over the next 2-3 weeks. Any sutures or staples will be removed at the office during your follow-up visit.  ACTIVITIES You may resume regular (light) daily activities beginning the next day--such as daily self-care, walking, climbing stairs--gradually increasing activities as tolerated.  You may have sexual intercourse when it is comfortable.  Refrain from any heavy lifting or straining until approved by your doctor. You may drive when you are no longer taking prescription pain medication, you can comfortably wear a seatbelt, and you can safely maneuver your car and apply brakes.  FOLLOW-UP You should see your doctor in the office for a follow-up appointment approximately 2-3 weeks after your surgery.  You should have been given your post-op/follow-up appointment when your surgery was scheduled.  If you did not receive a post-op/follow-up appointment, make sure that you call for this appointment within a day or two after you arrive home to insure a convenient appointment time.   WHEN TO CALL YOUR DOCTOR: Fever over 101.0 Inability to urinate Continued bleeding from incision.   Increased pain, redness, or drainage from the incision. Increasing abdominal pain  The clinic staff is available to answer your questions during regular business hours.  Please don't hesitate to call and ask to speak to one of the nurses for  clinical concerns.  If you have a medical emergency, go to the nearest emergency room or call 911.  A surgeon from [facility] is always on call at the hospital. [address], [address], [place], [place]  [address] ? [address] [address], [place], [place]   [address] [phone] ? [phone] ? FAX [phone] Web site: [email or web address]  

## 2021-10-18 NOTE — Progress Notes (Signed)
Patient ID: Sandy Carter, female   DOB: [birth year], 21 y.o.   MRN: [id number] FACULTY PRACTICE ANTEPARTUM(COMPREHENSIVE) NOTE  Sandy Carter is a 21 y.o. G1P0 at [redacted]w[redacted]d by best clinical estimate who is admitted for acute appendicitis.   Fetal presentation is cephalic. Length of Stay:  1  Days  ASSESSMENT: Principal Problem:   Acute appendicitis complicating pregnancy Active Problems:   Fetal growth restriction antepartum   Acute appendicitis   PLAN: FGR For induction of labor at 37 weeks We will move the patient to labor and delivery at 12 AM.  Acute appendicitis Status postsurgical removal Doing well Pain is much improved, GI symptoms are resolved.  Subjective: Feels well today Patient reports the fetal movement as active. Patient reports uterine contraction  activity as none. Patient reports  vaginal bleeding as none. Patient describes fluid per vagina as None.  Vitals:  Blood pressure 107/65, pulse (!) 104, temperature 98 F (36.7 C), temperature source Oral, resp. rate 16, height 5\' 2"  (1.575 m), weight 62.1 kg, last menstrual period 01/14/2021, SpO2 100 %. Physical Examination:  General appearance - alert, well appearing, and in no distress Chest - normal effort Abdomen - gravid, non-tender well-healed incision sites Fundal Height:  size equals dates Extremities: Homans sign is negative, no sign of DVT  Membranes:intact  Fetal Monitoring:  Baseline: 130 bpm, Variability: Good {> 6 bpm), Accelerations: Reactive, and Decelerations: Absent  Labs:  Results for orders placed or performed during the hospital encounter of 10/16/21 (from the past 24 hour(s))  CBC on admission   Collection Time: 10/17/21  3:35 PM  Result Value Ref Range   WBC 19.5 (H) 4.0 - 10.5 K/uL   RBC 3.44 (L) 3.87 - 5.11 MIL/uL   Hemoglobin 10.1 (L) 12.0 - 15.0 g/dL   HCT 12/15/21 (L) 79.0 - 24.0 %   MCV 88.4 80.0 - 100.0 fL   MCH 29.4 26.0 - 34.0 pg   MCHC 33.2 30.0 - 36.0 g/dL   RDW 97.3  53.2 - 99.2 %   Platelets 260 150 - 400 K/uL   nRBC 0.0 0.0 - 0.2 %     Medications:  Scheduled  docusate sodium  100 mg Oral Daily   prenatal multivitamin  1 tablet Oral Q1200   sodium chloride flush  3 mL Intravenous Q12H   I have reviewed the patient's current medications.   42.6, MD 10/18/2021,11:45 AM

## 2021-10-18 NOTE — Anesthesia Postprocedure Evaluation (Signed)
Anesthesia Post Note  Patient: Sandy Carter  Procedure(s) Performed: APPENDECTOMY LAPAROSCOPIC (Abdomen)     Patient location during evaluation: PACU Anesthesia Type: General Level of consciousness: awake Pain management: pain level controlled Vital Signs Assessment: post-procedure vital signs reviewed and stable Respiratory status: spontaneous breathing and respiratory function stable Cardiovascular status: stable Postop Assessment: no apparent nausea or vomiting Anesthetic complications: no Comments: OB following patient. No fetal distress postop. Tanna Furry, MD     No notable events documented.  Last Vitals:  Vitals:   10/18/21 0419 10/18/21 0420  BP: (!) 100/41 (!) 99/47  Pulse: 82 85  Resp: 16   Temp: 36.8 C   SpO2: 100%     Last Pain:  Vitals:   10/18/21 0530  TempSrc:   PainSc: 0-No pain                 Mellody Dance

## 2021-10-18 NOTE — Plan of Care (Signed)

## 2021-10-19 ENCOUNTER — Encounter (HOSPITAL_COMMUNITY): Payer: Self-pay | Admitting: Obstetrics & Gynecology

## 2021-10-19 ENCOUNTER — Inpatient Hospital Stay (HOSPITAL_COMMUNITY): Payer: Medicaid Other | Admitting: Anesthesiology

## 2021-10-19 ENCOUNTER — Other Ambulatory Visit: Payer: Self-pay

## 2021-10-19 ENCOUNTER — Encounter (HOSPITAL_COMMUNITY)
Admission: AD | Disposition: A | Discharge status: Home / Self Care | Payer: Self-pay | Source: Home / Self Care | Attending: Obstetrics & Gynecology

## 2021-10-19 ENCOUNTER — Inpatient Hospital Stay (HOSPITAL_COMMUNITY): Payer: Medicaid Other | Attending: Obstetrics and Gynecology

## 2021-10-19 ENCOUNTER — Inpatient Hospital Stay (HOSPITAL_COMMUNITY)
Admission: AD | Admit: 2021-10-19 | Payer: Medicaid Other | Source: Home / Self Care | Admitting: Obstetrics and Gynecology

## 2021-10-19 DIAGNOSIS — Z98891 History of uterine scar from previous surgery: Secondary | ICD-10-CM | POA: Diagnosis not present

## 2021-10-19 DIAGNOSIS — O0993 Supervision of high risk pregnancy, unspecified, third trimester: Secondary | ICD-10-CM

## 2021-10-19 DIAGNOSIS — O365930 Maternal care for other known or suspected poor fetal growth, third trimester, not applicable or unspecified: Secondary | ICD-10-CM

## 2021-10-19 DIAGNOSIS — F1290 Cannabis use, unspecified, uncomplicated: Secondary | ICD-10-CM

## 2021-10-19 DIAGNOSIS — O365990 Maternal care for other known or suspected poor fetal growth, unspecified trimester, not applicable or unspecified: Secondary | ICD-10-CM

## 2021-10-19 DIAGNOSIS — O9902 Anemia complicating childbirth: Secondary | ICD-10-CM

## 2021-10-19 DIAGNOSIS — K3580 Unspecified acute appendicitis: Secondary | ICD-10-CM

## 2021-10-19 DIAGNOSIS — Z3A37 37 weeks gestation of pregnancy: Secondary | ICD-10-CM

## 2021-10-19 DIAGNOSIS — R8271 Bacteriuria: Secondary | ICD-10-CM

## 2021-10-19 DIAGNOSIS — O99613 Diseases of the digestive system complicating pregnancy, third trimester: Secondary | ICD-10-CM

## 2021-10-19 DIAGNOSIS — D649 Anemia, unspecified: Secondary | ICD-10-CM

## 2021-10-19 DIAGNOSIS — Z3A36 36 weeks gestation of pregnancy: Secondary | ICD-10-CM

## 2021-10-19 LAB — CBC
HCT: 28 % — ABNORMAL LOW (ref 36.0–46.0)
Hemoglobin: 9.3 g/dL — ABNORMAL LOW (ref 12.0–15.0)
MCH: 29.2 pg (ref 26.0–34.0)
MCHC: 33.2 g/dL (ref 30.0–36.0)
MCV: 88.1 fL (ref 80.0–100.0)
Platelets: 235 10*3/uL (ref 150–400)
RBC: 3.18 MIL/uL — ABNORMAL LOW (ref 3.87–5.11)
RDW: 13 % (ref 11.5–15.5)
WBC: 10.6 10*3/uL — ABNORMAL HIGH (ref 4.0–10.5)
nRBC: 0 % (ref 0.0–0.2)

## 2021-10-19 LAB — TYPE AND SCREEN
ABO/RH(D): O POS
Antibody Screen: NEGATIVE

## 2021-10-19 LAB — RPR: RPR Ser Ql: NONREACTIVE

## 2021-10-19 SURGERY — Surgical Case
Anesthesia: Spinal

## 2021-10-19 MED ORDER — CEFAZOLIN SODIUM-DEXTROSE 2-4 GM/100ML-% IV SOLN
2.0000 g | INTRAVENOUS | Status: AC
  Administered 2021-10-19: 2 g via INTRAVENOUS

## 2021-10-19 MED ORDER — OXYTOCIN-SODIUM CHLORIDE 30-0.9 UT/500ML-% IV SOLN
INTRAVENOUS | Status: AC
  Filled 2021-10-19: qty 500

## 2021-10-19 MED ORDER — MEPERIDINE HCL 25 MG/ML IJ SOLN
6.2500 mg | INTRAMUSCULAR | Status: DC | PRN
  Administered 2021-10-19: 6.25 mg via INTRAVENOUS

## 2021-10-19 MED ORDER — KETOROLAC TROMETHAMINE 30 MG/ML IJ SOLN: 30.0000 mg | Freq: Once | INTRAMUSCULAR | Status: DC | PRN

## 2021-10-19 MED ORDER — MEPERIDINE HCL 25 MG/ML IJ SOLN
INTRAMUSCULAR | Status: AC
  Filled 2021-10-19: qty 1

## 2021-10-19 MED ORDER — SODIUM CHLORIDE 0.9% FLUSH: 3.0000 mL | INTRAVENOUS | Status: DC | PRN

## 2021-10-19 MED ORDER — SCOPOLAMINE 1 MG/3DAYS TD PT72: 1.0000 | MEDICATED_PATCH | Freq: Once | TRANSDERMAL | Status: DC

## 2021-10-19 MED ORDER — LACTATED RINGERS IV SOLN: INTRAVENOUS | Status: DC

## 2021-10-19 MED ORDER — DIPHENHYDRAMINE HCL 50 MG/ML IJ SOLN: 12.5000 mg | INTRAMUSCULAR | Status: DC | PRN

## 2021-10-19 MED ORDER — DEXAMETHASONE SODIUM PHOSPHATE 10 MG/ML IJ SOLN
INTRAMUSCULAR | Status: DC | PRN
  Administered 2021-10-19: 10 mg via INTRAVENOUS

## 2021-10-19 MED ORDER — SOD CITRATE-CITRIC ACID 500-334 MG/5ML PO SOLN
30.0000 mL | ORAL | Status: AC
  Administered 2021-10-19: 30 mL via ORAL

## 2021-10-19 MED ORDER — DIBUCAINE (PERIANAL) 1 % EX OINT: 1.0000 "application " | TOPICAL_OINTMENT | CUTANEOUS | Status: DC | PRN

## 2021-10-19 MED ORDER — ONDANSETRON HCL 4 MG/2ML IJ SOLN: 4.0000 mg | Freq: Once | INTRAMUSCULAR | Status: DC | PRN

## 2021-10-19 MED ORDER — DIPHENHYDRAMINE HCL 25 MG PO CAPS: 25.0000 mg | ORAL_CAPSULE | Freq: Four times a day (QID) | ORAL | Status: DC | PRN

## 2021-10-19 MED ORDER — PHENYLEPHRINE HCL-NACL 20-0.9 MG/250ML-% IV SOLN
INTRAVENOUS | Status: AC
  Filled 2021-10-19: qty 250

## 2021-10-19 MED ORDER — SIMETHICONE 80 MG PO CHEW: 80.0000 mg | CHEWABLE_TABLET | ORAL | Status: DC | PRN

## 2021-10-19 MED ORDER — DIPHENHYDRAMINE HCL 25 MG PO CAPS: 25.0000 mg | ORAL_CAPSULE | ORAL | Status: DC | PRN

## 2021-10-19 MED ORDER — PHENYLEPHRINE 40 MCG/ML (10ML) SYRINGE FOR IV PUSH (FOR BLOOD PRESSURE SUPPORT)
PREFILLED_SYRINGE | INTRAVENOUS | Status: DC | PRN
  Administered 2021-10-19: 40 ug via INTRAVENOUS

## 2021-10-19 MED ORDER — SENNOSIDES-DOCUSATE SODIUM 8.6-50 MG PO TABS
2.0000 | ORAL_TABLET | Freq: Every day | ORAL | Status: DC
  Administered 2021-10-20 – 2021-10-22 (×3): 2 via ORAL
  Filled 2021-10-19 (×3): qty 2

## 2021-10-19 MED ORDER — SIMETHICONE 80 MG PO CHEW
80.0000 mg | CHEWABLE_TABLET | Freq: Three times a day (TID) | ORAL | Status: DC
  Administered 2021-10-19 – 2021-10-22 (×8): 80 mg via ORAL
  Filled 2021-10-19 (×7): qty 1

## 2021-10-19 MED ORDER — ENOXAPARIN SODIUM 40 MG/0.4ML IJ SOSY
40.0000 mg | PREFILLED_SYRINGE | INTRAMUSCULAR | Status: DC
  Administered 2021-10-20 – 2021-10-22 (×3): 40 mg via SUBCUTANEOUS
  Filled 2021-10-19 (×3): qty 0.4

## 2021-10-19 MED ORDER — OXYCODONE HCL 5 MG PO TABS
5.0000 mg | ORAL_TABLET | ORAL | Status: DC | PRN
  Filled 2021-10-19: qty 1

## 2021-10-19 MED ORDER — COCONUT OIL OIL: 1.0000 "application " | TOPICAL_OIL | Status: DC | PRN

## 2021-10-19 MED ORDER — ONDANSETRON HCL 4 MG/2ML IJ SOLN: 4.0000 mg | Freq: Three times a day (TID) | INTRAMUSCULAR | Status: DC | PRN

## 2021-10-19 MED ORDER — OXYTOCIN BOLUS FROM INFUSION: 333.0000 mL | Freq: Once | INTRAVENOUS | Status: DC

## 2021-10-19 MED ORDER — MENTHOL 3 MG MT LOZG: 1.0000 | LOZENGE | OROMUCOSAL | Status: DC | PRN

## 2021-10-19 MED ORDER — FENTANYL CITRATE (PF) 100 MCG/2ML IJ SOLN
INTRAMUSCULAR | Status: DC | PRN
  Administered 2021-10-19: 15 ug via INTRATHECAL

## 2021-10-19 MED ORDER — TERBUTALINE SULFATE 1 MG/ML IJ SOLN
0.2500 mg | Freq: Once | INTRAMUSCULAR | Status: AC | PRN
  Administered 2021-10-19: 0.25 mg via SUBCUTANEOUS
  Filled 2021-10-19: qty 1

## 2021-10-19 MED ORDER — OXYCODONE HCL 5 MG/5ML PO SOLN: 5.0000 mg | Freq: Once | ORAL | Status: DC | PRN

## 2021-10-19 MED ORDER — ACETAMINOPHEN 500 MG PO TABS
1000.0000 mg | ORAL_TABLET | Freq: Four times a day (QID) | ORAL | Status: DC
  Administered 2021-10-19 – 2021-10-22 (×13): 1000 mg via ORAL
  Filled 2021-10-19 (×14): qty 2

## 2021-10-19 MED ORDER — SOD CITRATE-CITRIC ACID 500-334 MG/5ML PO SOLN
30.0000 mL | ORAL | Status: DC | PRN
  Filled 2021-10-19: qty 30

## 2021-10-19 MED ORDER — FENTANYL CITRATE (PF) 100 MCG/2ML IJ SOLN: 50.0000 ug | INTRAMUSCULAR | Status: DC | PRN

## 2021-10-19 MED ORDER — ACETAMINOPHEN 325 MG PO TABS: 650.0000 mg | ORAL_TABLET | ORAL | Status: DC | PRN

## 2021-10-19 MED ORDER — OXYTOCIN-SODIUM CHLORIDE 30-0.9 UT/500ML-% IV SOLN: 2.5000 [IU]/h | INTRAVENOUS | Status: AC

## 2021-10-19 MED ORDER — MORPHINE SULFATE (PF) 0.5 MG/ML IJ SOLN
INTRAMUSCULAR | Status: DC | PRN
  Administered 2021-10-19: 150 ug via INTRATHECAL

## 2021-10-19 MED ORDER — OXYCODONE-ACETAMINOPHEN 5-325 MG PO TABS: 2.0000 | ORAL_TABLET | ORAL | Status: DC | PRN

## 2021-10-19 MED ORDER — OXYCODONE HCL 5 MG PO TABS: 5.0000 mg | ORAL_TABLET | Freq: Once | ORAL | Status: DC | PRN

## 2021-10-19 MED ORDER — LIDOCAINE HCL (PF) 1 % IJ SOLN: 30.0000 mL | INTRAMUSCULAR | Status: DC | PRN

## 2021-10-19 MED ORDER — OXYTOCIN-SODIUM CHLORIDE 30-0.9 UT/500ML-% IV SOLN: 2.5000 [IU]/h | INTRAVENOUS | Status: DC

## 2021-10-19 MED ORDER — HYDROMORPHONE HCL 1 MG/ML IJ SOLN: 0.2500 mg | INTRAMUSCULAR | Status: DC | PRN

## 2021-10-19 MED ORDER — STERILE WATER FOR IRRIGATION IR SOLN
Status: DC | PRN
  Administered 2021-10-19: 1

## 2021-10-19 MED ORDER — PRENATAL MULTIVITAMIN CH
1.0000 | ORAL_TABLET | Freq: Every day | ORAL | Status: DC
  Administered 2021-10-22: 1 via ORAL
  Filled 2021-10-19 (×2): qty 1

## 2021-10-19 MED ORDER — KETOROLAC TROMETHAMINE 30 MG/ML IJ SOLN
30.0000 mg | Freq: Four times a day (QID) | INTRAMUSCULAR | Status: AC
  Administered 2021-10-19 – 2021-10-20 (×4): 30 mg via INTRAVENOUS
  Filled 2021-10-19 (×5): qty 1

## 2021-10-19 MED ORDER — LACTATED RINGERS IV SOLN: 500.0000 mL | INTRAVENOUS | Status: DC | PRN

## 2021-10-19 MED ORDER — PHENYLEPHRINE HCL-NACL 20-0.9 MG/250ML-% IV SOLN
INTRAVENOUS | Status: DC | PRN
  Administered 2021-10-19: 60 ug/min via INTRAVENOUS

## 2021-10-19 MED ORDER — FENTANYL CITRATE (PF) 100 MCG/2ML IJ SOLN
INTRAMUSCULAR | Status: AC
  Filled 2021-10-19: qty 2

## 2021-10-19 MED ORDER — BUPIVACAINE IN DEXTROSE 0.75-8.25 % IT SOLN
INTRATHECAL | Status: DC | PRN
  Administered 2021-10-19: 12 mg via INTRATHECAL

## 2021-10-19 MED ORDER — MEDROXYPROGESTERONE ACETATE 150 MG/ML IM SUSP: 150.0000 mg | INTRAMUSCULAR | Status: DC | PRN

## 2021-10-19 MED ORDER — ONDANSETRON HCL 4 MG/2ML IJ SOLN: 4.0000 mg | Freq: Four times a day (QID) | INTRAMUSCULAR | Status: DC | PRN

## 2021-10-19 MED ORDER — MISOPROSTOL 50MCG HALF TABLET
50.0000 ug | ORAL_TABLET | ORAL | Status: DC | PRN
  Administered 2021-10-19: 50 ug via BUCCAL
  Filled 2021-10-19: qty 1

## 2021-10-19 MED ORDER — OXYTOCIN-SODIUM CHLORIDE 30-0.9 UT/500ML-% IV SOLN
INTRAVENOUS | Status: DC | PRN
  Administered 2021-10-19: 30 [IU] via INTRAVENOUS

## 2021-10-19 MED ORDER — NALOXONE HCL 4 MG/10ML IJ SOLN
1.0000 ug/kg/h | INTRAVENOUS | Status: DC | PRN
  Filled 2021-10-19: qty 5

## 2021-10-19 MED ORDER — OXYCODONE-ACETAMINOPHEN 5-325 MG PO TABS: 1.0000 | ORAL_TABLET | ORAL | Status: DC | PRN

## 2021-10-19 MED ORDER — NALOXONE HCL 0.4 MG/ML IJ SOLN: 0.4000 mg | INTRAMUSCULAR | Status: DC | PRN

## 2021-10-19 MED ORDER — ONDANSETRON HCL 4 MG/2ML IJ SOLN
INTRAMUSCULAR | Status: AC
  Filled 2021-10-19: qty 2

## 2021-10-19 MED ORDER — WITCH HAZEL-GLYCERIN EX PADS: 1.0000 "application " | MEDICATED_PAD | CUTANEOUS | Status: DC | PRN

## 2021-10-19 MED ORDER — SODIUM CHLORIDE 0.9 % IR SOLN
Status: DC | PRN
  Administered 2021-10-19: 1

## 2021-10-19 MED ORDER — ONDANSETRON HCL 4 MG/2ML IJ SOLN
INTRAMUSCULAR | Status: DC | PRN
Start: 2021-10-19 — End: 2021-10-19
  Administered 2021-10-19: 4 mg via INTRAVENOUS

## 2021-10-19 MED ORDER — DEXAMETHASONE SODIUM PHOSPHATE 10 MG/ML IJ SOLN
INTRAMUSCULAR | Status: AC
  Filled 2021-10-19: qty 1

## 2021-10-19 MED ORDER — IBUPROFEN 600 MG PO TABS
600.0000 mg | ORAL_TABLET | Freq: Four times a day (QID) | ORAL | Status: DC
  Administered 2021-10-20 (×2): 600 mg via ORAL
  Filled 2021-10-19 (×2): qty 1

## 2021-10-19 MED ORDER — MORPHINE SULFATE (PF) 0.5 MG/ML IJ SOLN
INTRAMUSCULAR | Status: AC
  Filled 2021-10-19: qty 10

## 2021-10-19 SURGICAL SUPPLY — 38 items
BENZOIN TINCTURE PRP APPL 2/3 (GAUZE/BANDAGES/DRESSINGS) ×2 IMPLANT
CHLORAPREP W/TINT 26ML (MISCELLANEOUS) ×2 IMPLANT
CLAMP CORD UMBIL (MISCELLANEOUS) IMPLANT
CLOTH BEACON ORANGE TIMEOUT ST (SAFETY) ×2 IMPLANT
DERMABOND ADHESIVE PROPEN (GAUZE/BANDAGES/DRESSINGS) ×1
DERMABOND ADVANCED (GAUZE/BANDAGES/DRESSINGS)
DERMABOND ADVANCED .7 DNX12 (GAUZE/BANDAGES/DRESSINGS) IMPLANT
DERMABOND ADVANCED .7 DNX6 (GAUZE/BANDAGES/DRESSINGS) IMPLANT
DRSG OPSITE POSTOP 4X10 (GAUZE/BANDAGES/DRESSINGS) ×2 IMPLANT
ELECT REM PT RETURN 9FT ADLT (ELECTROSURGICAL) ×2
ELECTRODE REM PT RTRN 9FT ADLT (ELECTROSURGICAL) ×1 IMPLANT
EXTRACTOR VACUUM KIWI (MISCELLANEOUS) IMPLANT
GLOVE BIOGEL PI IND STRL 7.0 (GLOVE) ×3 IMPLANT
GLOVE BIOGEL PI INDICATOR 7.0 (GLOVE) ×3
GLOVE ECLIPSE 6.5 STRL STRAW (GLOVE) ×2 IMPLANT
GOWN STRL REUS W/TWL LRG LVL3 (GOWN DISPOSABLE) ×6 IMPLANT
KIT ABG SYR 3ML LUER SLIP (SYRINGE) IMPLANT
NDL HYPO 25X5/8 SAFETYGLIDE (NEEDLE) IMPLANT
NEEDLE HYPO 25X5/8 SAFETYGLIDE (NEEDLE) IMPLANT
NS IRRIG 1000ML POUR BTL (IV SOLUTION) ×2 IMPLANT
PACK C SECTION WH (CUSTOM PROCEDURE TRAY) ×2 IMPLANT
PAD ABD 7.5X8 STRL (GAUZE/BANDAGES/DRESSINGS) ×2 IMPLANT
PAD OB MATERNITY 4.3X12.25 (PERSONAL CARE ITEMS) ×2 IMPLANT
PENCIL SMOKE EVAC W/HOLSTER (ELECTROSURGICAL) ×2 IMPLANT
RTRCTR C-SECT PINK 25CM LRG (MISCELLANEOUS) ×2 IMPLANT
STRIP CLOSURE SKIN 1/2X4 (GAUZE/BANDAGES/DRESSINGS) ×2 IMPLANT
SUT PLAIN 0 NONE (SUTURE) IMPLANT
SUT PLAIN 2 0 XLH (SUTURE) IMPLANT
SUT VIC AB 0 CT1 27 (SUTURE) ×2
SUT VIC AB 0 CT1 27XBRD ANBCTR (SUTURE) ×2 IMPLANT
SUT VIC AB 0 CTX 36 (SUTURE) ×3
SUT VIC AB 0 CTX36XBRD ANBCTRL (SUTURE) ×3 IMPLANT
SUT VIC AB 2-0 CT1 27 (SUTURE) ×1
SUT VIC AB 2-0 CT1 TAPERPNT 27 (SUTURE) ×1 IMPLANT
SUT VIC AB 4-0 KS 27 (SUTURE) ×2 IMPLANT
TOWEL OR 17X24 6PK STRL BLUE (TOWEL DISPOSABLE) ×2 IMPLANT
TRAY FOLEY W/BAG SLVR 14FR LF (SET/KITS/TRAYS/PACK) IMPLANT
WATER STERILE IRR 1000ML POUR (IV SOLUTION) ×2 IMPLANT

## 2021-10-19 NOTE — Op Note (Addendum)
Sandy Carter  PROCEDURE DATE: 10/19/2021  PREOPERATIVE DIAGNOSES: Intrauterine pregnancy at [redacted]w[redacted]d weeks gestation; non-reassuring fetal status;  -fetal intolerance of labor -Fetal growth restriction  POSTOPERATIVE DIAGNOSES: The same  PROCEDURE: Primary Low Transverse Cesarean Section  SURGEON:  Dr. Myna Hidalgo  ASSISTANT:  Dr. Evalina Field   ANESTHESIOLOGY TEAM: Anesthesiologist: Trevor Iha, MD CRNA: Algis Greenhouse, CRNA; Elgie Congo, CRNA  INDICATIONS: Sandy Carter is a 21 y.o. G1P0 at [redacted]w[redacted]d here for cesarean section secondary to the indications listed under preoperative diagnoses; please see preoperative note for further details.  The risks of cesarean section were discussed with the patient including but were not limited to: bleeding which may require transfusion or reoperation; infection which may require antibiotics; injury to bowel, bladder, ureters or other surrounding organs; injury to the fetus; need for additional procedures including hysterectomy in the event of a life-threatening hemorrhage; placental abnormalities wth subsequent pregnancies, incisional problems, thromboembolic phenomenon and other postoperative/anesthesia complications.   The patient concurred with the proposed plan, giving informed written consent for the procedure.    FINDINGS:  Viable female infant in cephalic presentation with tight nuchal cord.  Apgars 8 and 9.  Clear amniotic fluid.  Intact placenta, three vessel cord.  Normal uterus, fallopian tubes and ovaries bilaterally.  ANESTHESIA: Spinal  INTRAVENOUS FLUIDS: 700 ml   ESTIMATED BLOOD LOSS: 250 ml URINE OUTPUT:  300 ml SPECIMENS: Placenta sent to pathology COMPLICATIONS: None immediate  PROCEDURE IN DETAIL:   The patient preoperatively received intravenous antibiotics and had sequential compression devices applied to her lower extremities.  She was then taken to the operating room where spinal anesthesia was  administered and was found to be adequate. She was then placed in a dorsal supine position with a leftward tilt, and prepped and draped in a sterile manner.  A foley catheter was placed into her bladder and attached to constant gravity.    After an adequate timeout was performed, a Pfannenstiel skin incision was made with a scalpel and carried through to the underlying layer of fascia. The fascia was incised in the midline, and this incision was extended bilaterally bluntly.  The superior aspect of the fascial incision and the underlying rectus muscles were dissected off bluntly. The rectus muscles were separated in the midline and the peritoneum was entered bluntly. The Alexis self-retaining retractor was introduced into the abdominal cavity.    Attention was turned to the lower uterine segment where a low transverse hysterotomy was made with a scalpel and extended bilaterally bluntly.  The infant was successfully delivered, the cord was clamped and cut after one minute, and the infant was handed over to the awaiting neonatology team. Uterine massage was then administered, and the placenta delivered intact with a three-vessel cord. The uterus was then cleared of clots and debris.    The hysterotomy was closed with 0 Vicryl in a running locked fashion, and an imbricating layer was also placed with 0 Vicryl.  The pelvis was cleared of all clot and debris. Hemostasis was confirmed on all surfaces.  The retractor was removed.    The peritoneum was closed with a 2-0 Vicryl running stitch. The fascia was then closed using 0 Vicryl in a running fashion. The subcutaneous layer was irrigated and the skin was closed with a 4-0 Vicryl subcuticular stitch. The patient tolerated the procedure well. Sponge, instrument and needle counts were correct x 3.  She was taken to the recovery room in stable condition.   Trula Ore  Mathis Fare, MD [facility] Fellow   Faculty Practice

## 2021-10-19 NOTE — Lactation Note (Signed)
This note was copied from a baby's chart. Lactation Consultation Note  Patient Name: Sandy Carter [id number] Date: 10/19/2021 Reason for consult: Initial assessment;Early term 37-38.6wks;1st time breastfeeding;Infant < 6lbs;Primapara Age:22 hours   P1 mother whose infant is now 32 hours old.  This is an ETI at 37+0 weeks weighing < 5 lbs.  Mother's current feeding preference is breast.  Mother also had her appendix removed with this cesarean section.  Baby "Sandy Carter" was asleep in mother's arms when I arrived.  Offered to assist with waking and latching; mother receptive.  Taught hand expression and mother able to express colostrum drops which I finger fed to "Sandy Carter."  Assisted to latch in the football hold.  He was able to latch, however, not interested in initiating a suck.  Gentle stimulation and breast compressions demonstrated.  Asked father to hold him STS.  Reviewed the LPTI policy and suggested mother begin supplementing and pumping.  Mother receptive to supplementing and pumping, however, she does not want to use formula.  Since we were able to feed colostrum drops, I suggested mother pump and, if she does not obtain enough colostrum for supplementation for the next feeding, she will start donor breast milk.  Initiated the DEBP; pump parts, assembly and cleaning reviewed.  #21 flanges are appropriate at this time.  I do anticipate mother may need to move to a #24 in the near future.  She is aware of how to assess for correct flange size.  After 15 minutes of pumping mother was able to obtain 8 mls of EBM.  Praised mother for her efforts and informed her that she has enough of her own EBM for the next feeding.  Parents excited.  RN updated.  Mother will call her RN/LC for latch assistance as needed.  She will feed at least every three hours or sooner if "Sandy Carter" shows cues.  Grandmother (father's mother) present and supportive.   Maternal Data Has patient been taught Hand Expression?: Yes Does  the patient have breastfeeding experience prior to this delivery?: No  Feeding Mother's Current Feeding Choice: Breast Milk  LATCH Score Latch: Repeated attempts needed to sustain latch, nipple held in mouth throughout feeding, stimulation needed to elicit sucking reflex.  Audible Swallowing: None  Type of Nipple: Everted at rest and after stimulation  Comfort (Breast/Nipple): Soft / non-tender  Hold (Positioning): Assistance needed to correctly position infant at breast and maintain latch.  LATCH Score: 6   Lactation Tools Discussed/Used Tools: Pump;Flanges Flange Size: 21 (Currently; may need to increase to a #24 soon, especially on the left breast) Breast pump type: Manual;Double-Electric Breast Pump Pump Education: Setup, frequency, and cleaning;Milk Storage Reason for Pumping: Breast stimulation for supplementation; baby ETI weighing < 5 lbs Pumping frequency: Every three hours Pumped volume: 8 mL  Interventions Interventions: Breast feeding basics reviewed;Assisted with latch;Skin to skin;Breast massage;Hand express;Breast compression;Adjust position;DEBP;Hand pump;Expressed milk;Position options;Support pillows;Education;LC Services brochure  Discharge Pump: DEBP;Manual;Personal (Mother uncertain of pump brand) WIC Program: No (Mother plans to apply)  Consult Status Consult Status: Follow-up Date: 10/20/21 Follow-up type: In-patient    Nefertari Rebman R Royce Sciara 10/19/2021, 10:57 AM

## 2021-10-19 NOTE — Transfer of Care (Signed)
Immediate Anesthesia Transfer of Care Note  Patient: Sandy Carter  Procedure(s) Performed: CESAREAN SECTION  Patient Location: PACU  Anesthesia Type:Spinal  Level of Consciousness: awake  Airway & Oxygen Therapy: Patient Spontanous Breathing  Post-op Assessment: Report given to RN  Post vital signs: Reviewed and stable  Last Vitals:  Vitals Value Taken Time  BP 163/124 10/19/21 0702  Temp    Pulse 95 10/19/21 0704  Resp 14 10/19/21 0704  SpO2 100 % 10/19/21 0704  Vitals shown include unvalidated device data.  Last Pain:  Vitals:   10/19/21 0527  TempSrc: Oral  PainSc:       Patients Stated Pain Goal: 4 (10/18/21 2000)  Complications: No notable events documented.

## 2021-10-19 NOTE — Anesthesia Preprocedure Evaluation (Addendum)
Anesthesia Evaluation  Patient identified by MRN, date of birth, ID band Patient awake    Reviewed: Allergy & Precautions, NPO status , Patient's Chart, lab work & pertinent test results  Airway Mallampati: II  TM Distance: >3 FB Neck ROM: Full    Dental no notable dental hx. (+) Teeth Intact, Dental Advisory Given   Pulmonary neg pulmonary ROS, Patient abstained from smoking.,    Pulmonary exam normal breath sounds clear to auscultation       Cardiovascular Exercise Tolerance: Good Normal cardiovascular exam+ dysrhythmias (palpitations)  Rhythm:Regular Rate:Normal     Neuro/Psych  Headaches, PSYCHIATRIC DISORDERS (ODD) Anxiety Depression    GI/Hepatic negative GI ROS, (+)     substance abuse  marijuana use,   Endo/Other  Hyperthyroidism   Renal/GU negative Renal ROS  negative genitourinary   Musculoskeletal S/P L5S1 microdiscectomy   Abdominal   Peds negative pediatric ROS (+)  Hematology  (+) Blood dyscrasia, anemia , Lab Results      Component                Value               Date                      WBC                      10.6 (H)            10/19/2021                HGB                      9.3 (L)             10/19/2021                HCT                      28.0 (L)            10/19/2021                MCV                      88.1                10/19/2021                PLT                      235                 10/19/2021              Anesthesia Other Findings S/P appy 10/17/21 w GA   Reproductive/Obstetrics (+) Pregnancy (36 weeks 5 days)                            Anesthesia Physical  Anesthesia Plan  ASA: 2 and emergent  Anesthesia Plan: Spinal   Post-op Pain Management:    Induction:   PONV Risk Score and Plan: 3 and Treatment may vary due to age or medical condition  Airway Management Planned:   Additional Equipment: None  Intra-op Plan:    Post-operative Plan:   Informed Consent: I have reviewed the patients History and Physical, chart,  labs and discussed the procedure including the risks, benefits and alternatives for the proposed anesthesia with the patient or authorized representative who has indicated his/her understanding and acceptance.       Plan Discussed with: Anesthesiologist and CRNA  Anesthesia Plan Comments:         Anesthesia Quick Evaluation

## 2021-10-19 NOTE — Progress Notes (Signed)
Pt c/o headache 7-8/10, worse with sitting and standing. Pt reports headache went away when RN had pt lay flat for assessment and lying orthostatic BP. Dr. Yetta Barre called and reviewed pt's complaints. No new orders at this time. Pt up in room tolerating activity well. Will continue to monitor.

## 2021-10-19 NOTE — Anesthesia Procedure Notes (Signed)
Spinal  Patient location during procedure: OB Start time: 10/19/2021 5:53 AM End time: 10/19/2021 5:56 AM Reason for block: surgical anesthesia Staffing Performed: anesthesiologist  Anesthesiologist: Trevor Iha, MD Preanesthetic Checklist Completed: patient identified, IV checked, risks and benefits discussed, surgical consent, monitors and equipment checked, pre-op evaluation and timeout performed Spinal Block Patient position: sitting Prep: DuraPrep and site prepped and draped Patient monitoring: heart rate, cardiac monitor, continuous pulse ox and blood pressure Approach: midline Location: L3-4 Injection technique: single-shot Needle Needle type: Pencan  Needle gauge: 24 G Needle length: 10 cm Needle insertion depth: 5 cm Assessment Sensory level: T4 Events: CSF return Additional Notes  1 Attempt (s). Pt tolerated procedure well.

## 2021-10-19 NOTE — Lactation Note (Signed)
This note was copied from a baby's chart. Lactation Consultation Note  Patient Name: Sandy Carter [id number] Date: 10/19/2021 Reason for consult: Follow-up assessment;Early term 37-38.6wks;Primapara;1st time breastfeeding Age:22 hours   LC Follow Up Note:  Mother requested latch assistance.    Arrived to find baby "Sandy Carter" alert and ready to feed.  Assisted to latch easily to the left breast in the football hold.  With gentle stimulation he fed with a wide gape, flanged lips and good jaw extensions.  Intermittent swallows noted.  Reviewed breast feeding basics while observing "Sandy Carter" feed for 18 minutes.  Demonstrated paced bottle feeding using the white Nfant nipple.  No difficulties noted; burped well.  He consumed 8 mls of mother's expressed breast milk.  Mother will continue to post pump and will eventually give donor breast milk if she is not able to produce enough volume for "Sandy Carter."  Reminded her to always feed back her EBM prior to using donor breast milk.  Parents will continue to feed at least every three hours or sooner if baby shows feeding cues.  They will call for latch assistance as needed.  RN updated.   Maternal Data Has patient been taught Hand Expression?: Yes Does the patient have breastfeeding experience prior to this delivery?: No  Feeding Mother's Current Feeding Choice: Breast Milk  LATCH Score Latch: Grasps breast easily, tongue down, lips flanged, rhythmical sucking.  Audible Swallowing: A few with stimulation  Type of Nipple: Everted at rest and after stimulation  Comfort (Breast/Nipple): Soft / non-tender  Hold (Positioning): Assistance needed to correctly position infant at breast and maintain latch.  LATCH Score: 8   Lactation Tools Discussed/Used Tools: Pump;Flanges Flange Size: 21;24 Breast pump type: Double-Electric Breast Pump;Manual Pump Education: Setup, frequency, and cleaning;Milk Storage Reason for Pumping: Breast stimulation for  supplementation Pumping frequency: Every three hours Pumped volume: 8 mL  Interventions Interventions: Breast feeding basics reviewed;Assisted with latch;Skin to skin;Breast massage;Hand express;Breast compression;Adjust position;Hand pump;Shells;Coconut oil;Expressed milk;Position options;Support pillows;Education  Discharge Pump: DEBP;Manual;Personal (Mother uncertain of pump brand) WIC Program: No (Mother plans to apply)  Consult Status Consult Status: Follow-up Date: 10/20/21 Follow-up type: In-patient    Shavell Nored R Amea Mcphail 10/19/2021, 1:24 PM

## 2021-10-19 NOTE — Progress Notes (Addendum)
° °  S: Pt began to have head ache this evening which she describes as sharp and shooting. It starts as a frontal headache and moves to her occiput. She states it is worse with sitting or standing and relieved by lying flat. When sitting up she rates it at a 7/10.   O: BP 109/64 (BP Location: Right Arm)    Pulse 67    Temp 98.2 F (36.8 C) (Oral)    Resp 18    Ht 5\' 2"  (1.575 m)    Wt 62.1 kg    LMP 01/14/2021 (Exact Date)    SpO2 100%    Breastfeeding Unknown    BMI 25.06 kg/m   Neuro exam: CN 2-12 intact, sensation intact, finger nose finger and alternating hand movements normal  A/P: As headache is worse with sitting/standing and better with lying flat, this could be a spinal headache. I advised pt to lie flat when she can and continue to take her pain medication. We will notify anesthesia in the morning if headache persists or sooner if headache worsens.   03/16/2021, DO 10/19/2021, 11:30 PM PGY-1, [facility]

## 2021-10-19 NOTE — Progress Notes (Signed)
Labor Progress Note Sandy Carter is a 21 y.o. G1P0 at [redacted]w[redacted]d who presented for IOL due to FGR.   S: Resting in bed. No concerns at this time.   O:  BP 114/73    Pulse 91    Temp 97.7 F (36.5 C) (Oral)    Resp 16    Ht 5\' 2"  (1.575 m)    Wt 62.1 kg    LMP 01/14/2021 (Exact Date)    SpO2 100%    BMI 25.06 kg/m   EFM: Baseline 150 bpm, moderate variability, + accels, variable/late decels  Toco: Occasional contractions   CVE: Dilation: 1 Effacement (%): Thick Station: -3 Presentation: Vertex Exam by:: Dr. [email or web address]   [name]: 21 y.o. G1P0 [redacted]w[redacted]d   #Labor: SVE as above. Initial Cytotec given at 0148 followed by terbutaline at 0242. Now having occasional contractions. FHT with recurrent variable/late decelerations. Given that patient is remote from delivery in addition to FGR, recommended cesarean section. Dr. [redacted]w[redacted]d at bedside to discuss recommendations and consent for procedure.   The risks of surgery were discussed with the patient including but were not limited to: bleeding which may require transfusion or reoperation; infection which may require antibiotics; injury to bowel, bladder, ureters or other surrounding organs; injury to the fetus; need for additional procedures including hysterectomy in the event of a life-threatening hemorrhage; formation of adhesions; placental abnormalities with subsequent pregnancies; incisional problems; thromboembolic phenomenon and other postoperative/anesthesia complications.  The patient concurred with the proposed plan, giving informed written consent for the procedure.  Anesthesia and OR aware. Preoperative prophylactic antibiotics and SCDs ordered on call to the OR.  To OR when ready.  #Pain: Per anesthesia #FWB: Cat 2  #GBS negative  Charlotta Newton, MD 5:19 AM

## 2021-10-19 NOTE — Anesthesia Postprocedure Evaluation (Signed)
Anesthesia Post Note  Patient: KHAMRYN CALDERONE  Procedure(s) Performed: CESAREAN SECTION     Patient location during evaluation: PACU Anesthesia Type: Spinal Level of consciousness: oriented and awake and alert Pain management: pain level controlled Vital Signs Assessment: post-procedure vital signs reviewed and stable Respiratory status: spontaneous breathing, respiratory function stable and nonlabored ventilation Cardiovascular status: blood pressure returned to baseline and stable Postop Assessment: no headache, no backache, no apparent nausea or vomiting, spinal receding and patient able to bend at knees Anesthetic complications: no   No notable events documented.  Last Vitals:  Vitals:   10/19/21 0818 10/19/21 0819  BP:    Pulse: 84 81  Resp: 18 19  Temp:    SpO2: 99% 98%    Last Pain:  Vitals:   10/19/21 0815  TempSrc:   PainSc: 0-No pain   Pain Goal: Patients Stated Pain Goal: 4 (10/18/21 2000)  LLE Motor Response: Purposeful movement (10/19/21 0815) LLE Sensation: Decreased (10/19/21 0815) RLE Motor Response: Purposeful movement (10/19/21 0815) RLE Sensation: Decreased (10/19/21 0815)     Epidural/Spinal Function Cutaneous sensation: Able to Discern Pressure (10/19/21 0815), Patient able to flex knees: Yes (10/19/21 0815), Patient able to lift hips off bed: No (10/19/21 0815), Back pain beyond tenderness at insertion site: No (10/19/21 0815), Progressively worsening motor and/or sensory loss: No (10/19/21 0815), Bowel and/or bladder incontinence post epidural: No (10/19/21 0815)  Tynetta Bachmann A.

## 2021-10-19 NOTE — Progress Notes (Signed)
Labor Progress Note Sandy Carter is a 21 y.o. G1P0 at [redacted]w[redacted]d who presented for IOL due to IUGR.  S: Doing well. No concerns at this time. Partner at bedside.   O:  BP 115/83    Pulse 80    Temp 97.7 F (36.5 C) (Oral)    Resp 16    Ht 5\' 2"  (1.575 m)    Wt 62.1 kg    LMP 01/14/2021 (Exact Date)    SpO2 100%    BMI 25.06 kg/m   EFM: Baseline 135 bpm, moderate variability, + accels, no decels  Toco: Irregular contractions   CVE: Dilation: Fingertip Effacement (%): Thick Station: -3 Presentation: Vertex Exam by:: Dr.Jones   [name]: 21 y.o. G1P0 [redacted]w[redacted]d   #Labor: Will start induction with dose of buccal Cytotec 50 mcg. Will attempt foley balloon placement next check as able.  #Pain: PRN #FWB: Cat 1  #GBS negative  [redacted]w[redacted]d, MD 1:49 AM

## 2021-10-19 NOTE — Progress Notes (Signed)
Assessed patient at bedside due to [facility] with recurrent variable decelerations, not improved with position changes and IVF bolus per RN. Dose of terbutaline given. FHT subsequently improved, now Cat 1. Will continue to monitor closely. Will attempt to place foley balloon in the interim. Will consider transition to Pitocin for further augmentation once it has been 4 hours since Cytotec dose. Dr. Nelda Marseille updated and in agreement with plan of care.  Vilma Meckel, MD

## 2021-10-19 NOTE — Discharge Summary (Signed)
Postpartum Discharge Summary     Patient Name: Sandy Carter DOB: 11/18/2000 MRN: [id number]  Date of admission: 10/16/2021 Delivery date:10/19/2021  Delivering provider: Janyth Pupa  Date of discharge: 10/22/2021  Admitting diagnosis: Fetal growth restriction antepartum [O36.5990] Acute appendicitis [K35.80] Intrauterine pregnancy: [redacted]w[redacted]d    Secondary diagnosis:  Principal Problem:   Cesarean delivery delivered Active Problems:   Supervision of high-risk pregnancy   Hyperthyroidism   Marijuana use   Fetal growth restriction antepartum   Acute appendicitis complicating pregnancy  Additional problems: S/P Laparoscopic Appendectomy    Discharge diagnosis: Term Pregnancy Delivered                                              Post partum procedures: None Augmentation: Cytotec Complications: None  Hospital course: Induction of Labor With Cesarean Section   21y.o. yo G1P0 at 365w0das admitted to the hospital 10/16/2021 for induction of labor due to fetal growth restriction. Patient received one dose of Cytotec, and patient subsequently had recurrent variable/late decelerations requiring a dose of terbutaline. The tracing continued to be category 2 due recurrent variable/late decelerations despite treatment with terbutaline, position changes, and IVF. The patient went for cesarean section due to Non-Reassuring FHR and fetal intolerance of labor. Delivery details are as follows: Membrane Rupture Time/Date:  ,   Delivery Method:C-Section, Low Transverse  Details of operation can be found in separate operative note.  Patient had an uncomplicated postpartum course. She is ambulating, tolerating a regular diet, passing flatus, and urinating well.  Patient is discharged home in stable condition on 10/22/21.      Newborn Data: Birth date:10/19/2021  Birth time:6:17 AM  Gender:Female  Living status:Living  Apgars:8 ,9  Weight:2220 g                                Magnesium Sulfate  received: No BMZ received: No Rhophylac: N/A MMR: N/A T-DaP: Given prenatally Flu: Given prenatally  Transfusion: No   Venofer infusion x 1 dose  Physical exam  Vitals:   10/21/21 0500 10/21/21 1419 10/21/21 1934 10/22/21 0530  BP: 111/75 110/71 109/74 102/76  Pulse: 77 62 78 70  Resp: 18 16 16 16   Temp: 98.4 F (36.9 C) 98.7 F (37.1 C) 98.5 F (36.9 C) 98.3 F (36.8 C)  TempSrc: Oral Oral Oral Oral  SpO2: 100% 100%  100%  Weight:      Height:       General: alert, cooperative, and no distress Lochia: appropriate Uterine Fundus: firm, U-2 Incision: Healing well with no significant drainage, No significant erythema, Dressing is clean, dry, and intact DVT Evaluation: No evidence of DVT seen on physical exam. Negative Homan's sign. No cords or calf tenderness. No significant calf/ankle edema.  Labs: Lab Results  Component Value Date   WBC 11.0 (H) 10/20/2021   HGB 7.9 (L) 10/20/2021   HCT 24.7 (L) 10/20/2021   MCV 89.2 10/20/2021   PLT 218 10/20/2021   CMP Latest Ref Rng & Units 10/16/2021  Glucose 70 - 99 mg/dL 127(H)  BUN 6 - 20 mg/dL 10  Creatinine 0.44 - 1.00 mg/dL 0.71  Sodium 135 - 145 mmol/L 131(L)  Potassium 3.5 - 5.1 mmol/L 3.8  Chloride 98 - 111 mmol/L 101  CO2 22 - 32  mmol/L 20(L)  Calcium 8.9 - 10.3 mg/dL 8.7(L)  Total Protein 6.5 - 8.1 g/dL 6.2(L)  Total Bilirubin 0.3 - 1.2 mg/dL 0.6  Alkaline Phos 38 - 126 U/L 110  AST 15 - 41 U/L 23  ALT 0 - 44 U/L 15   Edinburgh Score: Edinburgh Postnatal Depression Scale Screening Tool 10/20/2021  I have been able to laugh and see the funny side of things. 0  I have looked forward with enjoyment to things. 0  I have blamed myself unnecessarily when things went wrong. 2  I have been anxious or worried for no good reason. 0  I have felt scared or panicky for no good reason. 0  Things have been getting on top of me. 2  I have been so unhappy that I have had difficulty sleeping. 1  I have felt sad or  miserable. 0  I have been so unhappy that I have been crying. 2  The thought of harming myself has occurred to me. 0  Edinburgh Postnatal Depression Scale Total 7     After visit meds:  Allergies as of 10/22/2021       Reactions   Penicillins Rash   Reaction: 5 years   Penicillin G Other (See Comments)        Medication List     STOP taking these medications    TYLENOL PO       TAKE these medications    Blood Pressure Monitor Misc For regular home bp monitoring during pregnancy   hydrOXYzine 50 MG tablet Commonly known as: ATARAX Take 50 mg by mouth every 6 (six) hours as needed for anxiety or itching (Seizures).   ibuprofen 600 MG tablet Commonly known as: ADVIL Take 1 tablet (600 mg total) by mouth every 6 (six) hours.   PRENATAL VITAMIN PO Take by mouth.   ProAir HFA 108 (90 Base) MCG/ACT inhaler Generic drug: albuterol Inhale 1 puff into the lungs every 6 (six) hours as needed for wheezing or shortness of breath.   traMADol 50 MG tablet Commonly known as: Ultram Take 1 tablet (50 mg total) by mouth every 6 (six) hours as needed for up to 5 days for moderate pain or severe pain.         Discharge home in stable condition Infant Feeding: Breast Infant Disposition:rooming in Discharge instruction: per After Visit Summary and Postpartum booklet. Activity: Advance as tolerated. Pelvic rest for 6 weeks.  Diet: routine diet Future Appointments: Future Appointments  Date Time Provider [place]  10/25/2021 11:30 AM Roma Schanz, CNM [facility]  11/26/2021 11:30 AM Roma Schanz, CNM [facility]   Follow up Visit:  Follow-up Information     [facility] [place] Follow up.   Specialty: General Surgery Why: our office is scheduling you for a post-operative follow up appointment after your recent appendectomy. please call to confirm appointment date/time. Contact information: [address] [place]  [address] [phone]         [facility]. Schedule an appointment as soon as possible for a visit in 6 week(s).   Specialty: Obstetrics and Gynecology Why: postpartum visit Contact information: [place] [phone]               Message sent to [facility] by Dr. Gwenlyn Perking on 10/19/21.  Please schedule this patient for a In person postpartum visit in 6 weeks with the following provider: Any provider. Additional  Postpartum F/U: Incision check 1 week  High risk pregnancy complicated by:  Fetal growth restriction Delivery mode:  C-Section, Low Transverse  Anticipated Birth Control:  Depo  10/22/2021 Laury Deep, CNM

## 2021-10-20 LAB — CBC
HCT: 24.7 % — ABNORMAL LOW (ref 36.0–46.0)
Hemoglobin: 7.9 g/dL — ABNORMAL LOW (ref 12.0–15.0)
MCH: 28.5 pg (ref 26.0–34.0)
MCHC: 32 g/dL (ref 30.0–36.0)
MCV: 89.2 fL (ref 80.0–100.0)
Platelets: 218 10*3/uL (ref 150–400)
RBC: 2.77 MIL/uL — ABNORMAL LOW (ref 3.87–5.11)
RDW: 12.9 % (ref 11.5–15.5)
WBC: 11 10*3/uL — ABNORMAL HIGH (ref 4.0–10.5)
nRBC: 0 % (ref 0.0–0.2)

## 2021-10-20 MED ORDER — HYDROMORPHONE HCL 1 MG/ML IJ SOLN
1.0000 mg | INTRAMUSCULAR | Status: AC | PRN
  Administered 2021-10-20 – 2021-10-21 (×3): 1 mg via INTRAVENOUS
  Filled 2021-10-20 (×3): qty 1

## 2021-10-20 MED ORDER — KETOROLAC TROMETHAMINE 30 MG/ML IJ SOLN
30.0000 mg | Freq: Four times a day (QID) | INTRAMUSCULAR | Status: AC
  Administered 2021-10-20 – 2021-10-21 (×3): 30 mg via INTRAVENOUS
  Filled 2021-10-20 (×3): qty 1

## 2021-10-20 MED ORDER — HYDROMORPHONE HCL 1 MG/ML IJ SOLN: 1.0000 mg | INTRAMUSCULAR | Status: DC | PRN

## 2021-10-20 MED ORDER — SODIUM CHLORIDE 0.9 % IV SOLN
500.0000 mg | Freq: Once | INTRAVENOUS | Status: AC
  Administered 2021-10-20: 500 mg via INTRAVENOUS
  Filled 2021-10-20: qty 25

## 2021-10-20 NOTE — Clinical Social Work Maternal (Signed)
CLINICAL SOCIAL WORK MATERNAL/CHILD NOTE  Patient Details  Name: Sandy Carter MRN: [id number] Date of Birth: 10/19/2022  Date:  10/20/2021  Clinical Social Worker Initiating Note:  Chevis Pretty, LCSWA Date/Time: Initiated:  10/20/21/1030     Child's Name:  Sandy Carter   Biological Parents:  Mother, Father Sandy Carter)   Need for Interpreter:  None   Reason for Referral:  Behavioral Health Concerns, Current Substance Use/Substance Use During Pregnancy     Address:  [address] [place] [address]    Phone number:  [phone] (home)     Additional phone number:   Household Members/Support Persons (HM/SP):   Household Member/Support Person 1   HM/SP Name Relationship DOB or Age  HM/SP -1 Sandy Carter Significant Other 03/29/1998  HM/SP -2        HM/SP -3        HM/SP -4        HM/SP -5        HM/SP -6        HM/SP -7        HM/SP -8          Natural Supports (not living in the home):  Extended Family   Professional Supports: None   Employment: Full-time   Type of Work: Scientist, research (life sciences)   Education:  [place] arranged:    Museum/gallery curator Resources:  [employer]   Other Resources:   (Plans to apply for [employer] and food stamps)   Cultural/Religious Considerations Which May Impact Care:    Strengths:  Ability to meet basic needs  , Engineer, materials, Home prepared for child     Psychotropic Medications:         Pediatrician:     ([place])  Pediatrician List:   [facility]      Pediatrician Fax Number:    Risk Factors/Current Problems:  None   Cognitive State:  Alert  , Linear Thinking     Mood/Affect:  Interested  , Overwhelmed  , Comfortable     CSW Assessment: CSW consulted for history of anxiety, depression and THC. CSW met with [name] to assess and provide support. CSW introduced self and role. CSW observed  infant sleeping in bassinet and FOB sleeping. [name] reported FOB could remain in room for assessment and anything could be discussed with him present. CSW informed [name] of the reason for consult and assessed current feelings. [name] was very pleasant and open regarding her feelings. [name] shared she is feeling anxious, stressed and upset. [name] expressed that breastfeeding has been hard. [name] stated she feels she is not doing what she needs for baby and there has been a lot of crying. CSW provided empathetic listening and normalized [name]'s feelings. CSW encouraged [name] to continue utilizing lactation as a resource and to combat negative thoughts with positive affirmations. CSW asked [name] if she is interested in counseling services to manage the feelings. [name] reported counseling has never been helpful and she does not like taking medications. CSW asked [name] how she copes with the feelings. [name] stated she will take a minute to relax. CSW commended [name] on identifying a way of coping and reiterated coping techniques such as deep breathing. [name] confirmed a diagnosis of anxiety and depression, stating she can not recall when she was first diagnosed. [name] stated  she is always over-thinking. [name] stated she will continue to work though her feelings on her own. [name] reported she is bonding well with infant and was observed smiling at him. [name] identified FOB and his family as the primary supports postpartum. [name] declines any current SI or HI.   CSW inquired on [name] substance use. [name] reported she stopped using THC months ago. [name] stated she used it in the beginning because it really helps her "mellow out" and calm down. CSW informed [name] of the hospital drug screen policy. [name] aware infant UDS is negative and the CDS will be followed. [name] aware a CPS report will be made if infant test positive for substances. [name] denies any questions.  CSW provided education regarding the baby blues period versus PPD and provided mental health resources. CSW  provided the New Mom Checklist and encouraged [name] to self evaluate. [name] reported she feels comfortable notifying someone if mental health needs become unmanageable.   CSW provided review of Sudden Infant Death Syndrome (SIDS) precautions.  [name] stated infant will sleep in a bassinet. [name] denies any transportation barriers to care. CSW provided [name] with information on [facility]. [name] stated she plans to apply in person. [name] declined any additional needs at this time.   CSW will continue to follow CDS and make a CPS report if warranted. CSW identifies no further need for intervention and no barriers to discharge at this time  CSW Plan/Description:  No Further Intervention Required/No Barriers to Discharge, CSW Will Continue to Monitor Umbilical Cord Tissue Drug Screen Results and Make Report if Warranted, Child Protective Service Report  , [place], Perinatal Mood and Anxiety Disorder (PMADs) Education, Sudden Infant Death Syndrome (SIDS) Education, Other Information/Referral to [employer], [place] 10/20/2021, 11:02 AM

## 2021-10-20 NOTE — Progress Notes (Signed)
Interim Progress Note   Evaluated patient at bedside at request of RN due to increasing HA since the afternoon. Seen by anesthesia earlier today who suspected her HA was more consistent with lack of sleep/dehydration and less likely spinal related.   She describes it as "sharp and constant" starting from the top of her head and going down to her occipital area bilaterally. Better when she lays flat for a period of time. Worse sitting up. No associated N/V/lightheadedness/dizziness/numbness/weakness. She has been drinking plenty of water today and trying to rest when possible.   Blood pressure 93/81, pulse 69, temperature 98.4 F (36.9 C), temperature source Oral, resp. rate 18, height 5\' 2"  (1.575 m), weight 62.1 kg, last menstrual period 01/14/2021, SpO2 100 %, unknown if currently breastfeeding.  Gen: NAD, comfortable, lights on in the room  Neuro:  A&Ox3. CN2-12 intact. Able to move all extremities equally and spontaneously. Sensation to light touch intact throughout. EOMI. Pupils equal bilaterally. Patient placed in flat supine with some improvement in HA over duration of exam.  MSK: tense trapezius musculature bilaterally with tender points present.   A/P:  Headache, likely multifactorial: Neurologically intact. Ddx: spinal HA (positionally improves), tension (tense musculature), vs fatigue/lack of sleep/change in routine related.   --Trial kpad to shoulders with massage  --Switched ibuprofen to IV Toradol x 24 hours  --IV dilaudid PRN severe pain --May discuss with Anesthesia again if worsening  RN present for evaluation. Patient/RN aware to call if worsening/not improving.   03/16/2021, DO

## 2021-10-20 NOTE — Lactation Note (Signed)
This note was copied from a baby's chart. Lactation Consultation Note  Patient Name: Sandy Carter [id number] Date: 10/20/2021 Reason for consult: Follow-up assessment;Early term 37-38.6wks;Infant < 6lbs;Primapara;1st time breastfeeding Age:22 hours  P1 mother whose infant is now 24 hours old.  This is an ETI at 37+0 weeks weighing < 5 lbs.  Mother's current feeding preference is breast/donor breast milk.  Mother had a cesarean section and also had her appendix removed.  Baby "Sandy Carter" was asleep in father's arms when I arrived; feeding completed.  He had consumed 15 mls of donor breast milk using the white Nfant nipple.  Father feels like he is doing well with this nipple.  Father remarked that he seems to be getting more hungry.  Reviewed supplementation volumes and suggested 20+ mls after every breast feeding session.    Parents are both exhausted; "Sandy Carter" went to the nursery for a few hours last night to allow the parents to rest.  Parents don't feel comfortable with both of them asleep when he is in the room.  Discussed a plan for today that will maximize feeding times with adequate volumes while also allowing adequate rest time during the day.  Mother has not consistently pumped during the night and her volume decreased from 8 mls yesterday morning to drops today.  Encouragement and reassurance provided.  Observed her pumping this morning and increased the flange size to a #27 for better fit and comfort.  Provided coconut oil with instructions for use.  Nipples intact with no trauma noted.  Parents have been very receptive to teaching and are following advice given.  Mother will continue to breast feed at least every three hours or sooner if "Sandy Carter" shows cues.  Father will supplement with 20+ mls of donor milk while mother post pumps.  They will call for assistance as needed.  Parents work well together.  RN updated.  Suggested RN place an order for "Sandy Carter" to have his own supply of donor breast milk  for the next 24 hours.    Mother plans to apply for [facility].  She has her own DEBP for home use; unsure of brand.     Maternal Data Has patient been taught Hand Expression?: Yes Does the patient have breastfeeding experience prior to this delivery?: No  Feeding Mother's Current Feeding Choice: Breast Milk and Donor Milk Nipple Type: Nfant Standard Flow (white)  LATCH Score                    Lactation Tools Discussed/Used Tools: Pump;Flanges;Coconut oil Flange Size: 27 Breast pump type: Double-Electric Breast Pump;Manual Pump Education: Setup, frequency, and cleaning;Milk Storage (Reviewed) Reason for Pumping: Breast stimulation for supplementation; baby < 5 lbs Pumping frequency: Every three hours Pumped volume:  (drops)  Interventions Interventions: Breast feeding basics reviewed;Education  Discharge Pump: DEBP;Manual;Personal (Mother unaware of DEBP brand) WIC Program: No (Mother plans to apply)  Consult Status Consult Status: Follow-up Date: 10/21/21 Follow-up type: In-patient    Shawneen Deetz R Keeven Matty 10/20/2021, 10:04 AM

## 2021-10-20 NOTE — Progress Notes (Signed)
Pt expressed concern about upper mid abdomen incision from appendectomy. Pt states it "looks different from earlier." Dermabond appears intact, no tenderness endorsed at site, not reddened or swollen. Dr. Ronnald Ramp notified of  pt's concern and to evaluate pt at bedside.

## 2021-10-20 NOTE — Progress Notes (Signed)
Post Partum Day 1 Subjective: Doing well considering she is POD#1 s/p CS and POD#2 appendectomy. Pain is controlled and bleeding is appropriate. She is eating, drinking, passing flatus, and ambulating without issue. She is breast and bottle feeding which is going well considering she is POD #1 but pt is tearful when pumping as she was not producing much this morning. Overnight she developed a headache which was worse with sitting/standing and better with lying flat. This morning she states the headache is much improved and rates it at a 2/10. She feels fatigued this morning.  Objective: Blood pressure 108/78, pulse 87, temperature 98 F (36.7 C), temperature source Oral, resp. rate 18, height 5\' 2"  (1.575 m), weight 62.1 kg, last menstrual period 01/14/2021, SpO2 99 %, unknown if currently breastfeeding.  Physical Exam:  General: alert, cooperative, and fatigued Uterine Fundus: firm Incision: healing well, no significant drainage, no dehiscence, no significant erythema DVT Evaluation: No cords or calf tenderness. No significant calf/ankle edema.  Recent Labs    10/19/21 0054 10/20/21 0441  HGB 9.3* 7.9*  HCT 28.0* 24.7*    Assessment/Plan: Sandy Carter is a 21 y.o. G1P0 on POD# 1 s/p pLTCS and POD#2 s/p appendectomy.  Progressing well. Meeting postpartum milestones. VSS.  If headache worsens and continues to be worse with sitting/standing and better with lying, will notify anesthesia as this could be a spinal headache.   Blood loss anemia: Hgb 7.9 down from 9.3 yesterday. IV venofer today.   Feeding: breast and bottle Contraception: depo Circumcision: yes  Dispo: home in 2 days   LOS: 3 days   36, DO  10/20/2021, 8:07 AM

## 2021-10-21 ENCOUNTER — Inpatient Hospital Stay (HOSPITAL_COMMUNITY): Admit: 2021-10-21 | Payer: Self-pay

## 2021-10-21 MED ORDER — TRAMADOL HCL 50 MG PO TABS
50.0000 mg | ORAL_TABLET | Freq: Four times a day (QID) | ORAL | Status: DC | PRN
Start: 2021-10-21 — End: 2021-10-22
  Administered 2021-10-22 (×3): 50 mg via ORAL
  Filled 2021-10-21 (×3): qty 1

## 2021-10-21 MED ORDER — IBUPROFEN 600 MG PO TABS
600.0000 mg | ORAL_TABLET | Freq: Four times a day (QID) | ORAL | Status: DC
  Administered 2021-10-22 (×3): 600 mg via ORAL
  Filled 2021-10-21 (×3): qty 1

## 2021-10-21 NOTE — Lactation Note (Signed)
This note was copied from a baby's chart. Lactation Consultation Note  Patient Name: Sandy Carter [id number] Date: 10/21/2021   Age:22 hours   Dad requested a White Nfant nipple for bottle feeding, as that is what they had been using. With questioning, Dad reported that sometimes infant gulps with this nipple and there is lateral spillage. I provided Dad with an Nfant slow flow nipple and requested that Dr. Viann Fish place an order for an SLP consult.   Lurline Hare [facility] 10/21/2021, 10:48 AM

## 2021-10-21 NOTE — Progress Notes (Signed)
Per report from dayshift RN who had pt from 1500-1900, pt c/o return of headache in the afternoon. Anesthesia was called during the day and saw pt and felt was related to dehydration and lack of sleep and had encouraged pt to push fluids and rest as well as stagger motrin and tylenol. In to meet pt after shift change report and pt talking to peds NP about headache. Dorathy Kinsman CNM called and will discuss with Dr. Annia Friendly to evaluate pt's headache.

## 2021-10-21 NOTE — Progress Notes (Signed)
Pt still with complaints of headache. Better now after toradol and dilaudid at 0454. Pt hasn't been able to rest much during the night despite FOB helping care for baby because the baby has been fussy and she has been trying to hold him. RN to watch baby in nursery for awhile to give mom a quieter environment to try to rest. Pt encouraged to try lying with head of bed lowered.

## 2021-10-21 NOTE — Progress Notes (Signed)
POSTPARTUM PROGRESS NOTE  Post Partum Day #2 from pLTCS and POD#3 from appendectomy  Subjective:  Sandy Carter is a 21 y.o. G1P1001 s/p pLTCS at [redacted]w[redacted]d.  She reports she is doing well. No acute events overnight. She denies any problems with ambulating, voiding or po intake. Denies nausea or vomiting.  Pain is moderately controlled.  Lochia is appropriate.  Objective: Blood pressure 111/75, pulse 77, temperature 98.4 F (36.9 C), temperature source Oral, resp. rate 18, height 5\' 2"  (1.575 m), weight 62.1 kg, last menstrual period 01/14/2021, SpO2 100 %, unknown if currently breastfeeding.  Physical Exam:  General: alert, cooperative and no distress Chest: no respiratory distress Heart:regular rate, distal pulses intact Abdomen: soft, nontender,  Uterine Fundus: firm, appropriately tender DVT Evaluation: No calf swelling or tenderness Extremities: no LE edema Skin: warm, dry. Incisions well approximated and healing, no discharge or erythema  Recent Labs    10/19/21 0054 10/20/21 0441  HGB 9.3* 7.9*  HCT 28.0* 24.7*    Assessment/Plan: Sandy Carter is a 21 y.o. G1P1001 s/p pLCTS at [redacted]w[redacted]d and lap appy at [redacted]w[redacted]d.  POD#2 - Doing well  Routine postpartum care  Contraception: Depo prior to discharge Feeding: Breast and bottle Dispo: Plan for discharge PPD#3.   LOS: 4 days   [redacted]w[redacted]d, MD/[facility] OB Fellow  10/21/2021, 10:20 AM

## 2021-10-22 ENCOUNTER — Encounter: Payer: Medicaid Other | Admitting: Women's Health

## 2021-10-22 ENCOUNTER — Other Ambulatory Visit: Payer: Medicaid Other

## 2021-10-22 LAB — SURGICAL PATHOLOGY

## 2021-10-22 MED ORDER — IBUPROFEN 600 MG PO TABS: 600.0000 mg | ORAL_TABLET | Freq: Four times a day (QID) | ORAL | 0 refills | Status: DC

## 2021-10-22 MED ORDER — ONDANSETRON HCL 4 MG PO TABS
4.0000 mg | ORAL_TABLET | Freq: Three times a day (TID) | ORAL | Status: DC | PRN
  Administered 2021-10-22 (×2): 4 mg via ORAL
  Filled 2021-10-22 (×2): qty 1

## 2021-10-22 MED ORDER — TRAMADOL HCL 50 MG PO TABS: 50.0000 mg | ORAL_TABLET | Freq: Four times a day (QID) | ORAL | 0 refills | Status: AC | PRN

## 2021-10-22 NOTE — Progress Notes (Signed)
Interim progress note   Called by RN that patient's pain is inadequately treated (incisional and HA). Has not taken ibuprofen because "it doesn't work". Declined oxycodone because it made her feel "off" and nauseous previously. Ordered tramadol earlier but has not taken yet. Requesting to have IV replaced to get dilaudid.   Evaluated patient at bedside. Reports that she has not slept >3 hours consecutively in the past three days due to infant and pain.   Discussed that she will be discharging home tomorrow and that it is important to find an oral pain regimen that will help her pain to be tolerable/manageable. She endorses understanding.   Willing to re-try ibuprofen scheduled. Start pre-regimen Zofran with tramadol. Can also try oral dilaudid if tramadol is ineffective. Placed heating pad on abdomen. Hopeful she will be able to rest, infant sleeping while in room.   Allayne Stack, DO

## 2021-10-22 NOTE — Progress Notes (Signed)
Called to bedside by RN to evaluate patient for post dural puncture headache.  Patient had subarachnoid block for Caesarean Section on the morning of February 11th using a 24 guage pencan needle.  Although a spinal headache with a 24 gauge needle is uncommon, it is still a possibility. Headache is positional and is suspicious for a spinal headache but patient doesn't wish to go through another procedure at this time  Patient was advised that if she changes her mind in the coming days, she can return to [facility] during day time hours and we can perform the blood patch at that time.

## 2021-10-22 NOTE — Lactation Note (Addendum)
This note was copied from a baby's chart. Lactation Consultation Note  Patient Name: Sandy Carter [id number] Date: 10/22/2021 Reason for consult: Follow-up assessment;Early term 37-38.6wks;Infant weight loss;1st time breastfeeding;Primapara;Infant < 6lbs Age:22 hours   P1 mother whose infant is now 97 hours old.  This is an ETI at 37+0 weeks weighing < 5 lbs.  Mother's current feeding preference is formula.  Baby is being fed Similac Neosure.  Baby had an 11% weight loss this morning.  Mother had a Cesarean section and also had her appendix removed at the same time.  She has been in a lot of pain and today complains of a constant headache that worsens when she sits up or walks.  Anesthesiologist has spoken with her.  Mother was originally latching, however, due to pain, exhaustion and her headache she has not latched.  She prefers to have father formula feed.  Mother has been solely responsible for doing STS with "[name]" which she does often.  She is hoping to eventually breast feed.  When discussing pumping, mother stated that she has not pumped in 2 days.  She has been hand expressing.  Informed her that she needs to begin now and has to be very diligent about pumping in order to obtain and maintain a good milk supply.  Mother verbalized understanding and is interested.  Observed her pumping for 15 minutes using the #27 flange size; mother obtained 12 mls.  Praised her for her efforts.  She has coconut oil for comfort.  Mother having a hard time holding her flanges in place.  Made her a belly band; mother appreciative.    Father will care for "[name]" today while mother gets extra rest.  Mother will hold "[name]" STS as much as possible and pump every three hours.  Encouraged hand expression before/after pumping.  Parents will feed back any EBM prior to formula supplementation.  Pediatrician in room while I was visiting; discussed allowing "[name]" to feed an extra 5-10 minutes to consume more volume if  he shows cues and is actively feeding.  Last feeding he consumed 50 mls without difficulty.    Parents continue to request minimal interruptions.  Sign placed on door and RN alerted.     Maternal Data    Feeding Mother's Current Feeding Choice: Formula Nipple Type: Dr. Lorne Skeens  [facility] Score                    Lactation Tools Discussed/Used Tools: Pump;Flanges;Coconut oil;Bottle Flange Size: 27 Breast pump type: Double-Electric Breast Pump;Manual Pump Education: Setup, frequency, and cleaning;Milk Storage (Reviewed) Reason for Pumping: Breast stimulation for supplementation for baby weighing < 4 lbs; mother not latching currently Pumping frequency: Every three hours Pumped volume: 12 mL  Interventions Interventions: Education;Hand express  Discharge Pump: DEBP;Manual;Personal  Consult Status Consult Status: Follow-up Date: 10/23/21 Follow-up type: In-patient    Dora Sims 10/22/2021, 12:34 PM

## 2021-10-23 ENCOUNTER — Ambulatory Visit: Payer: Self-pay

## 2021-10-23 NOTE — Lactation Note (Addendum)
This note was copied from a baby's chart. Lactation Consultation Note  Patient Name: Sandy Carter'R Date: 10/23/2021 Reason for consult: Follow-up assessment;Primapara;1st time breastfeeding;Early term 37-38.6wks;Infant < 6lbs;Infant weight loss Age:22 days  LC in to visit with P1 Mom of ET infant on day of discharge. Baby at 9.9% weight loss with good output. Baby gained 30 gms from yesterday and has been taking in more volume by bottle.  Parents feeding baby 22 cal formula, volumes of 37-53 ml.  Mom aware of benefits of any EBM.    Asked Mom about her pumping and Mom states she has too much going on.  Mom stated that she pumped a couple times last night.  Mom does have a pump at home.  Engorgement prevention and treatment reviewed.  Mom's breasts are filling and have some hard areas, but no engorgement.    Mom aware of OP lactation support and encouraged to call.   Lactation Tools Discussed/Used Tools: Pump;Flanges;Bottle;Coconut oil;Hands-free pumping top Flange Size: 27 Breast pump type: Double-Electric Breast Pump;Manual Pump Education: Setup, frequency, and cleaning;Milk Storage Reason for Pumping: support milk supply/Mom supplementing baby due to small size and weight loss Pumping frequency: Pump a couple times last night Pumped volume: 12 mL  Interventions Interventions: Breast feeding basics reviewed;Skin to skin;Breast massage;Hand express;DEBP;Hand pump  Discharge Discharge Education: Engorgement and breast care Pump: Manual;DEBP;Personal  Consult Status Consult Status: Complete Date: 10/23/21 Follow-up type: Call as needed    [name] 10/23/2021, 1:03 PM

## 2021-10-25 ENCOUNTER — Encounter: Payer: Medicaid Other | Admitting: Women's Health

## 2021-10-25 ENCOUNTER — Other Ambulatory Visit: Payer: Medicaid Other

## 2021-10-29 ENCOUNTER — Encounter: Payer: Medicaid Other | Admitting: Obstetrics and Gynecology

## 2021-10-29 ENCOUNTER — Ambulatory Visit (INDEPENDENT_AMBULATORY_CARE_PROVIDER_SITE_OTHER): Payer: Medicaid Other | Admitting: Obstetrics & Gynecology

## 2021-10-29 ENCOUNTER — Other Ambulatory Visit: Payer: Self-pay

## 2021-10-29 ENCOUNTER — Encounter: Payer: Self-pay | Admitting: Obstetrics & Gynecology

## 2021-10-29 ENCOUNTER — Emergency Department (HOSPITAL_COMMUNITY)
Admission: EM | Admit: 2021-10-29 | Discharge: 2021-10-29 | Disposition: A | Discharge status: Home / Self Care | Payer: Medicaid Other | Attending: Emergency Medicine | Admitting: Emergency Medicine

## 2021-10-29 ENCOUNTER — Other Ambulatory Visit: Payer: Medicaid Other

## 2021-10-29 ENCOUNTER — Encounter (HOSPITAL_COMMUNITY): Payer: Self-pay | Admitting: *Deleted

## 2021-10-29 VITALS — BP 115/74 | HR 80 | Wt 126.4 lb

## 2021-10-29 DIAGNOSIS — Z5321 Procedure and treatment not carried out due to patient leaving prior to being seen by health care provider: Secondary | ICD-10-CM | POA: Insufficient documentation

## 2021-10-29 DIAGNOSIS — L509 Urticaria, unspecified: Secondary | ICD-10-CM | POA: Insufficient documentation

## 2021-10-29 DIAGNOSIS — Z30013 Encounter for initial prescription of injectable contraceptive: Secondary | ICD-10-CM

## 2021-10-29 DIAGNOSIS — Z4889 Encounter for other specified surgical aftercare: Secondary | ICD-10-CM

## 2021-10-29 MED ORDER — MEDROXYPROGESTERONE ACETATE 150 MG/ML IM SUSP: 150.0000 mg | INTRAMUSCULAR | 4 refills | Status: DC

## 2021-10-29 MED ORDER — MEDROXYPROGESTERONE ACETATE 150 MG/ML IM SUSP
150.0000 mg | Freq: Once | INTRAMUSCULAR | Status: AC
  Administered 2021-10-29: 150 mg via INTRAMUSCULAR

## 2021-10-29 NOTE — Progress Notes (Signed)
° ° °  PostOp Visit Note  Sandy Carter is a 21 y.o. G11P1001 female who presents for a postoperative visit. She is 2 weeks postop following a primary C-section completed on 10/19/21    Denies fever or chills.  Tolerating gen diet.  +Flatus, Regular BMs.  Pain is well controlled.   Overall doing well and reports no acute complaints   Review of Systems Pertinent items are noted in HPI.    Objective:  BP 115/74 (BP Location: Left Arm, Patient Position: Sitting, Cuff Size: Normal)    Pulse 80    Wt 126 lb 6.4 oz (57.3 kg)    Breastfeeding Yes    BMI 23.12 kg/m    Physical Examination:  GENERAL ASSESSMENT: well developed and well nourished SKIN: normal color, no lesions CHEST: normal air exchange, respiratory effort normal with no retractions HEART: regular rate and rhythm ABDOMEN: soft, non-distended INCISION: with dermabond- C/D/I- healing appropriately EXTREMITY: no calf tenderness bilaterally PSYCH: mood appropriate, normal affect       Assessment:    Postop visit   Plan:   -reviewed incision care -meeting milestones appropriately -Discussed contraception- desires Depot- 1st shot today -f/u for final postpartum visit  Myna Hidalgo, DO Attending Obstetrician & Gynecologist, Faculty Practice Center for [employer], [facility]

## 2021-10-29 NOTE — ED Triage Notes (Signed)
Hives over body after receiving a depo shot

## 2021-10-29 NOTE — Progress Notes (Signed)
Depo Provera 150 mg given IM in Right Deltoid. Patient tolerated well. Next dose in 12 weeks. Pt informed to pick up next dose from pharmacy and bring to her visit.

## 2021-10-30 ENCOUNTER — Encounter: Payer: Self-pay | Admitting: *Deleted

## 2021-10-30 ENCOUNTER — Encounter: Payer: Self-pay | Admitting: Women's Health

## 2021-11-01 ENCOUNTER — Other Ambulatory Visit: Payer: Medicaid Other

## 2021-11-05 ENCOUNTER — Encounter: Payer: Medicaid Other | Admitting: Women's Health

## 2021-11-05 ENCOUNTER — Other Ambulatory Visit: Payer: Medicaid Other

## 2021-11-05 ENCOUNTER — Encounter: Payer: Self-pay | Admitting: Obstetrics & Gynecology

## 2021-11-09 ENCOUNTER — Inpatient Hospital Stay (HOSPITAL_COMMUNITY): Admit: 2021-11-09 | Payer: Self-pay

## 2021-11-13 ENCOUNTER — Encounter: Payer: Self-pay | Admitting: Obstetrics & Gynecology

## 2021-11-13 ENCOUNTER — Ambulatory Visit (INDEPENDENT_AMBULATORY_CARE_PROVIDER_SITE_OTHER): Payer: Medicaid Other | Admitting: Obstetrics & Gynecology

## 2021-11-13 ENCOUNTER — Other Ambulatory Visit: Payer: Self-pay

## 2021-11-13 VITALS — BP 115/75 | HR 78 | Ht 62.0 in | Wt 127.6 lb

## 2021-11-13 DIAGNOSIS — R52 Pain, unspecified: Secondary | ICD-10-CM

## 2021-11-13 DIAGNOSIS — O9089 Other complications of the puerperium, not elsewhere classified: Secondary | ICD-10-CM

## 2021-11-13 DIAGNOSIS — F321 Major depressive disorder, single episode, moderate: Secondary | ICD-10-CM

## 2021-11-13 MED ORDER — GABAPENTIN 300 MG PO CAPS
300.0000 mg | ORAL_CAPSULE | Freq: Three times a day (TID) | ORAL | 0 refills | Status: DC
Start: 2021-11-13 — End: 2021-11-26

## 2021-11-13 MED ORDER — ESCITALOPRAM OXALATE 10 MG PO TABS: 10.0000 mg | ORAL_TABLET | Freq: Every day | ORAL | 6 refills | Status: DC

## 2021-11-13 NOTE — Progress Notes (Signed)
? ? ?  PostOp Visit Note ? ?Sandy Carter is a 21 y.o. G39P1001 female who presents for  postoperative concerns. She is 4 week postop following a pLTCS completed on 10/19/21 as well as recent appendectomy ? ?Feels like she is constantly tearing and feels a burning sensation.  Feels puffier than it should be. Some improvement with ice.  Stopped Ibuprofen because it didn't feel like it was helping.  With tylenol feels like the pain is 6/10.  Anytime she has pain across her abdomen notes pain/discomfort.  She is concerned that she is not healing like she is supposed to. ? ?No nausea/vomiting.   BMs every few days and this was about the same as before pregnancy.  Notes some improvement with pain after BM. Denies dysuria.  Still notes light spotting.+ ? ?Also notes change in her mood, feeling more depressed.  She is there for her baby, but just feeling very overwhelmed with things.  Reports lacking confidence and unhappiness with herself and her body.  Reports h/o Depression and has tried multiple medications- cannot recall what worked best.  Typically, it would help for a few mos.  ?She has seen a therapist in the past, but nothing recent. ? ?Contraception: Depot on 10/29/21- had allergic reaction- will review alternative options at next visit ? ?Review of Systems ?Pertinent items are noted in HPI.   ? ?Objective:  ?BP 115/75 (BP Location: Right Arm, Patient Position: Sitting, Cuff Size: Normal)   Pulse 78   Ht 5\' 2"  (1.575 m)   Wt 57.9 kg   Breastfeeding No   BMI 23.34 kg/m?   ? ?Physical Examination:  ?GENERAL ASSESSMENT: well developed and well nourished ?SKIN: warm and dry ?CHEST: normal air exchange, respiratory effort normal with no retractions ?HEART: regular rate and rhythm ?ABDOMEN: soft, non-distended, no reproducible pain or discomfort, non-tender ?INCISION: dermabond removed- incisions well healed- C/D/I ?EXTREMITY: no calf tenderness ?PSYCH: mood appropriate, normal affect ?      ?Assessment:  ? ?  -Postop pain ?-Depression  ? ?Plan:  ? ?-referral created to Behavioral integrated ?-discussed started Lexapro 10mg  daily ?[]  may consider referral to Psych due to prior past medications ?-Trial of gabapentin x 5 days.  Reassured pt that healing very well from surgery, no abnormalities noted ? ? , DO ?Attending Obstetrician & Gynecologist, Faculty Practice ?Center for , [facility] ? ?  ?

## 2021-11-26 ENCOUNTER — Other Ambulatory Visit: Payer: Self-pay

## 2021-11-26 ENCOUNTER — Encounter: Payer: Self-pay | Admitting: Women's Health

## 2021-11-26 ENCOUNTER — Ambulatory Visit (INDEPENDENT_AMBULATORY_CARE_PROVIDER_SITE_OTHER): Payer: Medicaid Other | Admitting: Women's Health

## 2021-11-26 DIAGNOSIS — Z30016 Encounter for initial prescription of transdermal patch hormonal contraceptive device: Secondary | ICD-10-CM

## 2021-11-26 DIAGNOSIS — Z8759 Personal history of other complications of pregnancy, childbirth and the puerperium: Secondary | ICD-10-CM

## 2021-11-26 DIAGNOSIS — F418 Other specified anxiety disorders: Secondary | ICD-10-CM

## 2021-11-26 DIAGNOSIS — Z3202 Encounter for pregnancy test, result negative: Secondary | ICD-10-CM | POA: Diagnosis not present

## 2021-11-26 LAB — POCT URINE PREGNANCY: Preg Test, Ur: NEGATIVE

## 2021-11-26 MED ORDER — NORELGESTROMIN-ETH ESTRADIOL 150-35 MCG/24HR TD PTWK: 1.0000 | MEDICATED_PATCH | TRANSDERMAL | 12 refills | Status: DC

## 2021-11-26 NOTE — Progress Notes (Addendum)
? ?POSTPARTUM VISIT ?Patient name: Sandy Carter MRN [id number]  Date of birth: 12/22/2000 ?Chief Complaint:   ?Postpartum Care (Got Depo here 2/21; had tightness in chest, hard time breathing, hives; interested in patch) ? ?History of Present Illness:   ?Sandy Carter is a 21 y.o. G5P1001 Caucasian female being seen today for a postpartum visit. She is 5 weeks postpartum following a primary cesarean section, low transverse incision at 37.0 gestational weeks d/t NRFHR remote from term during IOL for FGR. IOL: yes, for fetal growth restriction . Anesthesia: spinal.  Laceration: n/a.  Complications: none. Inpatient contraception: no.   ?Pregnancy complicated by FGR , acute appendicitis w/ appendectomy @ 36.5wks (2d prior to her c/s) ?Tobacco use: vapes. Substance use disorder: no. ?Last pap smear: 05/01/21 and results were NILM w/ HRHPV not done. Next pap smear due: 2025 ?No LMP recorded. ? ?Postpartum course has been uncomplicated. Feels like c/s incision isn't right, some pain, lumpy feeling. Bleeding none. Bowel function is normal. Bladder function is  constipation- always has . C/S Urinary incontinence? no, fecal incontinence? no ?Patient is not sexually active. Last sexual activity: prior to birth of baby. Desired contraception:  got 1 dose of depo 10/29/21, got hives, chest tightness, trouble breathing- had used depo in past w/o these sex . Wants patch, has done well w/ it in the past. Patient does want a pregnancy in the future.  Desired family size is uncertain #of children.  ? Upstream - 11/26/21 1201   ? ?  ? Pregnancy Intention Screening  ? Does the patient want to become pregnant in the next year? No   ? Does the patient's partner want to become pregnant in the next year? No   ? Would the patient like to discuss contraceptive options today? Yes   ?  ? Contraception Wrap Up  ? Current Method Abstinence   ? End Method Contraceptive Patch   ? ?  ?  ? ?  ? ?The pregnancy intention screening data noted above  was reviewed. Potential methods of contraception were discussed. The patient elected to proceed with Contraceptive Patch. ? ?Edinburgh Postpartum Depression Screening: positive, started on lexapro 43m by Dr. ONelda Marseille2/21, can't remember to take. Denies SI/HI/II. Not sleeping/eating well. Declines therapy. Plans to set alarm to remind her to take the lexapro.  ? Edinburgh Postnatal Depression Scale - 11/26/21 1150   ? ?  ? Edinburgh Postnatal Depression Scale:  In the Past 7 Days  ? I have been able to laugh and see the funny side of things. 1   ? I have looked forward with enjoyment to things. 1   ? I have blamed myself unnecessarily when things went wrong. 2   ? I have been anxious or worried for no good reason. 2   ? I have felt scared or panicky for no good reason. 0   ? Things have been getting on top of me. 2   ? I have been so unhappy that I have had difficulty sleeping. 2   ? I have felt sad or miserable. 1   ? I have been so unhappy that I have been crying. 2   ? The thought of harming myself has occurred to me. 0   ? Edinburgh Postnatal Depression Scale Total 13   ? ?  ?  ? ?  ?  ?GAD 7 : Generalized Anxiety Score 08/16/2021 05/01/2021 03/13/2021  ?Nervous, Anxious, on Edge 2 3 2   ?Control/stop worrying 1 3 3   ?  Worry too much - different things 3 2 2   ?Trouble relaxing 1 3 0  ?Restless 0 0 0  ?Easily annoyed or irritable 1 1 2   ?Afraid - awful might happen 0 1 0  ?Total GAD 7 Score 8 13 9   ? ? ? ?Baby's course has been uncomplicated. Baby is feeding by bottle. Infant has a pediatrician/family doctor? Yes.  Childcare strategy if returning to work/school:  did not discuss .  Pt has material needs met for her and baby: Yes.   ?Review of Systems:   ?Pertinent items are noted in HPI ?Denies Abnormal vaginal discharge w/ itching/odor/irritation, headaches, visual changes, shortness of breath, chest pain, abdominal pain, severe nausea/vomiting, or problems with urination or bowel movements. ?Pertinent History Reviewed:   ?Reviewed past medical,surgical, obstetrical and family history.  ?Reviewed problem list, medications and allergies. ?OB History  ?Gravida Para Term Preterm AB Living  ?1 1 1     1   ?SAB IAB Ectopic Multiple Live Births  ?        1  ?  ?# Outcome Date GA Lbr Len/2nd Weight Sex Delivery Anes PTL Lv  ?1 Term 10/19/21 [redacted]w[redacted]d 4 lb 14.3 oz (2.22 kg) M CS-LTranv Spinal  LIV  ? ?Physical Assessment:  ? ?Vitals:  ? 11/26/21 1138  ?BP: 122/79  ?Pulse: 98  ?Weight: 126 lb 8 oz (57.4 kg)  ?Height: 5' 2"  (1.575 m)  ?Body mass index is 23.14 kg/m?. ? ?     Physical Examination:  ? General appearance: alert, well appearing, and in no distress ? Mental status: alert, oriented to person, place, and time ? Skin: warm & dry  ? Cardiovascular: normal heart rate noted  ? Respiratory: normal respiratory effort, no distress  ? Breasts: deferred, no complaints  ? Abdomen: soft, non-tender, c/s incision well-healed, no erythema, induration ? Pelvic: examination not indicated. Thin prep pap obtained: No ? Rectal: not examined ? Extremities: Edema: none  ? ?Chaperone: N/A   ?      ?Results for orders placed or performed in visit on 11/26/21 (from the past 24 hour(s))  ?POCT urine pregnancy  ? Collection Time: 11/26/21 11:57 AM  ?Result Value Ref Range  ? Preg Test, Ur Negative Negative  ?  ?Assessment & Plan:  ?1) Postpartum exam ?2) 5 wks s/p primary cesarean section, low transverse incision d/t NRFHR during IOL for FGR ?3) bottle feeding ?4) Depression screening ?5) Contraception management: rx ORivka Barbara5/9 (11wks from depo), f/u 363ms, condoms x 2wks ?6) Constipation> gave printed prevention/relief measures  ?7) Dep/anx> set alarm to remind to take lexapro 1083maily, declines therapy, f/u 4wks ? ?Essential components of care per ACOG recommendations: ? ?1.  Mood and well being:  ?If positive depression screen, discussed and plan developed.  ?If using tobacco we discussed reduction/cessation and risk of relapse ?If current  substance abuse, we discussed and referral to local resources was offered.  ? ?2. Infant care and feeding:  ?If breastfeeding, discussed returning to work, pumping, breastfeeding-associated pain, guidance regarding return to fertility while lactating if not using another method. If needed, patient was provided with a letter to be allowed to pump q 2-3hrs to support lactation in a private location with access to a refrigerator to store breastmilk.   ?Recommended that all caregivers be immunized for flu, pertussis and other preventable communicable diseases ?If pt does not have material needs met for her/baby, referred to local resources for help obtaining these. ? ?3. Sexuality, contraception and  birth spacing ?Provided guidance regarding sexuality, management of dyspareunia, and resumption of intercourse ?Discussed avoiding interpregnancy interval <20mhs and recommended birth spacing of 18 months ? ?4. Sleep and fatigue ?Discussed coping options for fatigue and sleep disruption ?Encouraged family/partner/community support of 4 hrs of uninterrupted sleep to help with mood and fatigue ? ?5. Physical recovery  ?If pt had a C/S, assessed incisional pain and providing guidance on normal vs prolonged recovery ?If pt had a laceration, perineal healing and pain reviewed.  ?If urinary or fecal incontinence, discussed management and referred to PT or uro/gyn if indicated  ?Patient is safe to resume physical activity. Discussed attainment of healthy weight. ? ?6.  Chronic disease management ?Discussed pregnancy complications if any, and their implications for future childbearing and long-term maternal health. ?Review recommendations for prevention of recurrent pregnancy complications, such as 17 hydroxyprogesterone caproate to reduce risk for recurrent PTB not applicable, or aspirin to reduce risk of preeclampsia not applicable. ?Pt had GDM: no. If yes, 2hr GTT scheduled: not applicable. ?Reviewed medications and non-pregnant  dosing including consideration of whether pt is breastfeeding using a reliable resource such as LactMed: not applicable ?Referred for f/u w/ PCP or subspecialist providers as indicated: not applicable ? ?7.

## 2021-11-26 NOTE — Patient Instructions (Signed)
Constipation  Drink plenty of fluid, preferably water, throughout the day  Eat foods high in fiber such as fruits, vegetables, and grains  Exercise, such as walking, is a good way to keep your bowels regular  Drink warm fluids, especially warm prune juice, or decaf coffee  Eat a 1/2 cup of real oatmeal (not instant), 1/2 cup applesauce, and 1/2-1 cup warm prune juice every day  If needed, you may take Colace (docusate sodium) stool softener once or twice a day to help keep the stool soft. If you are pregnant, wait until you are out of your first trimester (12-14 weeks of pregnancy)  If you still are having problems with constipation, you may take Miralax once daily as needed to help keep your bowels regular.  If you are pregnant, wait until you are out of your first trimester (12-14 weeks of pregnancy)    

## 2021-12-03 ENCOUNTER — Other Ambulatory Visit: Payer: Medicaid Other

## 2021-12-13 ENCOUNTER — Encounter: Payer: Self-pay | Admitting: Obstetrics & Gynecology

## 2021-12-18 ENCOUNTER — Encounter: Payer: Self-pay | Admitting: Adult Health

## 2021-12-18 ENCOUNTER — Ambulatory Visit (INDEPENDENT_AMBULATORY_CARE_PROVIDER_SITE_OTHER): Payer: Medicaid Other | Admitting: Adult Health

## 2021-12-18 VITALS — BP 108/72 | HR 83 | Ht 62.0 in | Wt 125.0 lb

## 2021-12-18 DIAGNOSIS — Z98891 History of uterine scar from previous surgery: Secondary | ICD-10-CM | POA: Insufficient documentation

## 2021-12-18 DIAGNOSIS — N939 Abnormal uterine and vaginal bleeding, unspecified: Secondary | ICD-10-CM | POA: Insufficient documentation

## 2021-12-18 NOTE — Progress Notes (Signed)
?  Subjective:  ?  ? Patient ID: Sandy Carter, female   DOB: [birth year], 21 y.o.   MRN: [id number] ? ?HPI ?Sandy Carter is a 21 year old white female, with SO, G1P1, had primary C-section 10/19/21 baby boy, in complaining of still bleeding. She had depo 10/29/21, and did not like, is planning on starting the patch 01/14/22. She is pumping.  ?PCP is [facility]. ? ?Lab Results  ?Component Value Date  ? DIAGPAP  05/01/2021  ?  - Negative for intraepithelial lesion or malignancy (NILM)  ?  ?Review of Systems ?Still bleeding after  C-section, not heavy, can wear pad all day but doesn't ?Says  C-section scar still tender ?Sex was uncomfortable  ?Has chronic constipation ? ?  Reviewed past medical,surgical, social and family history. Reviewed medications and allergies.  ?Objective:  ? Physical Exam ?BP 108/72 (BP Location: Right Arm, Patient Position: Sitting, Cuff Size: Normal)   Pulse 83   Ht 5\' 2"  (1.575 m)   Wt 125 lb (56.7 kg)   Breastfeeding Yes Comment: pumping  BMI 22.86 kg/m?   ?  Skin warm and dry.Pelvic: external genitalia is normal in appearance no lesions, vagina: scant red blood,urethra has no lesions or masses noted, cervix:smooth, non tender, uterus: normal size, shape and contour, non tender, no masses felt, adnexa: no masses, mild LLQ tenderness noted over bowel. Bladder is non tender and no masses felt. Abdomen is soft and non tender, C section scar healed, still has some numbness  ?Examination chaperoned by Celene Squibb LPN ? Upstream - 12/18/21 1006   ? ?  ? Pregnancy Intention Screening  ? Does the patient want to become pregnant in the next year? No   ? Does the patient's partner want to become pregnant in the next year? No   ? Would the patient like to discuss contraceptive options today? No   ?  ? Contraception Wrap Up  ? Current Method Hormonal Injection   ? End Method Hormonal Injection   going to start patch in May  ? Contraception Counseling Provided No   ? ?  ?  ? ?  ?  ?Assessment:  ?   ?1. Complaint  of vaginal bleeding ?Explained it is not uncommon to have bleeding for 2-12 weeks after delivery ?No evidence of infection ?Depo is in system and can have irregular bleeding  ?Start patch 01/14/22 ?Call in follow up as needed  ? ?2. S/P cesarean section ?Explained can take 6 months for nerves and tissue to heal after C section ?   ?Plan:  ?   ?Follow up prn  ?   ?

## 2021-12-23 ENCOUNTER — Encounter: Payer: Self-pay | Admitting: Women's Health

## 2021-12-24 ENCOUNTER — Encounter: Payer: Self-pay | Admitting: Women's Health

## 2021-12-24 ENCOUNTER — Telehealth (INDEPENDENT_AMBULATORY_CARE_PROVIDER_SITE_OTHER): Payer: Medicaid Other | Admitting: Women's Health

## 2021-12-24 DIAGNOSIS — F418 Other specified anxiety disorders: Secondary | ICD-10-CM

## 2021-12-24 DIAGNOSIS — Z3009 Encounter for other general counseling and advice on contraception: Secondary | ICD-10-CM | POA: Diagnosis not present

## 2021-12-24 NOTE — Progress Notes (Signed)
? ?TELEHEALTH VIRTUAL GYN VISIT ENCOUNTER NOTE ?Patient name: Sandy Carter MRN [id number]  Date of birth: November 10, 2000 ? ?I connected with patient on 12/24/21 at  2:30 PM EDT by MyChart video  and verified that I am speaking with the correct person using two identifiers.  Pt is not currently in the office, she is at home.  Provider is in the office.  ?  ?I discussed the limitations, risks, security and privacy concerns of performing an evaluation and management service by telephone and the availability of in person appointments. I also discussed with the patient that there may be a patient responsible charge related to this service. The patient expressed understanding and agreed to proceed. ?  ?Chief Complaint:   ?Follow-up ? ?History of Present Illness:   ?Sandy Carter is a 21 y.o. G41P1001 Caucasian female 22mths s/p PCS being evaluated today for f/u on lexapro 10mg  for dep/anx. Doing well, feels better. Denies SI/HI/II. EPDS today 6, was 13 on 3/21. Got 1 dose of depo but had reaction, has rx for patches, to start 5/9 (11wks from depo).    ? ?  08/16/2021  ?  9:48 AM 05/01/2021  ?  3:16 PM 03/13/2021  ?  1:54 PM  ?Depression screen PHQ 2/9  ?Decreased Interest 0 0 1  ?Down, Depressed, Hopeless 1 0 2  ?PHQ - 2 Score 1 0 3  ?Altered sleeping 1 3 3   ?Tired, decreased energy 2 3 3   ?Change in appetite 2 0 0  ?Feeling bad or failure about yourself  0 0 0  ?Trouble concentrating 2 3 0  ?Moving slowly or fidgety/restless 0 0 0  ?Suicidal thoughts 0 0 0  ?PHQ-9 Score 8 9 9   ? ? ?No LMP recorded. ?The current method of family planning is Depo-Provera injections.  ?Last pap 05/01/21. Results were:  NILM w/ HRHPV not done ?Review of Systems:   ?Pertinent items are noted in HPI ?Denies fever/chills, dizziness, headaches, visual disturbances, fatigue, shortness of breath, chest pain, abdominal pain, vomiting, abnormal vaginal discharge/itching/odor/irritation, problems with periods, bowel movements, urination, or intercourse  unless otherwise stated above.  ?Pertinent History Reviewed:  ?Reviewed past medical,surgical, social, obstetrical and family history.  ?Reviewed problem list, medications and allergies. ?Physical Assessment:  ?There were no vitals filed for this visit.There is no height or weight on file to calculate BMI. ? ?     Physical Examination:   ?General:  Alert, oriented and cooperative.   ?Mental Status: Normal mood and affect perceived. Normal judgment and thought content.  ?Physical exam deferred due to nature of the encounter ? ?No results found for this or any previous visit (from the past 24 hour(s)).  ?Assessment & Plan:  ?1) 22mths s/p PCS ? ?2) Dep/anx> doing well on lexapro 10mg , continue ? ?3) Contraception counseling> start patch 5/9 (11wks from depo), condoms x 2wks if sexually active, f/u 22mths after (note routed to [place] to schedule) (note routed to [place] to schedule) ? ?Meds: No orders of the defined types were placed in this encounter. ? ? ?No orders of the defined types were placed in this encounter. ? ? ?I discussed the assessment and treatment plan with the patient. The patient was provided an opportunity to ask questions and all were answered. The patient agreed with the plan and demonstrated an understanding of the instructions. ?  ?The patient was advised to call back or seek an in-person evaluation/go to the ED if the symptoms worsen or if the condition fails to improve as anticipated. ? ?I provided  15 minutes of non-face-to-face time during this encounter. ? ? ?Return for 8/9 for med f/u in person. ? ?Roma Schanz CNM, WHNP-BC ?12/24/2021 ?2:54 PM   ?

## 2022-01-09 ENCOUNTER — Encounter: Payer: Self-pay | Admitting: Obstetrics & Gynecology

## 2022-01-13 IMAGING — US US PELVIS COMPLETE TRANSABD/TRANSVAG W DUPLEX
1 series · 13 of 25 positions shown · non-contrast
Comparison: None.

CLINICAL DATA: Left pelvic pain, CT 2 weeks ago now side hospital
ED that showed left ovarian cyst. Last menstrual period 05/07/2020,

EXAM:
TRANSABDOMINAL AND TRANSVAGINAL ULTRASOUND OF PELVIS
DOPPLER ULTRASOUND OF OVARIES
TECHNIQUE: Both transabdominal and transvaginal ultrasound examinations of the
pelvis were performed. Transabdominal technique was performed for
global imaging of the pelvis including uterus, ovaries, adnexal
regions, and pelvic cul-de-sac.
It was necessary to proceed with endovaginal exam following the
transabdominal exam to visualize the uterus, endometrium, ovaries,
adnexa . Color and duplex Doppler ultrasound was utilized to
evaluate blood flow to the ovaries.

[Series 1: us pelvic complete with transvaginal · 106 acquisitions, 13 frames shown]
[im 1/106]
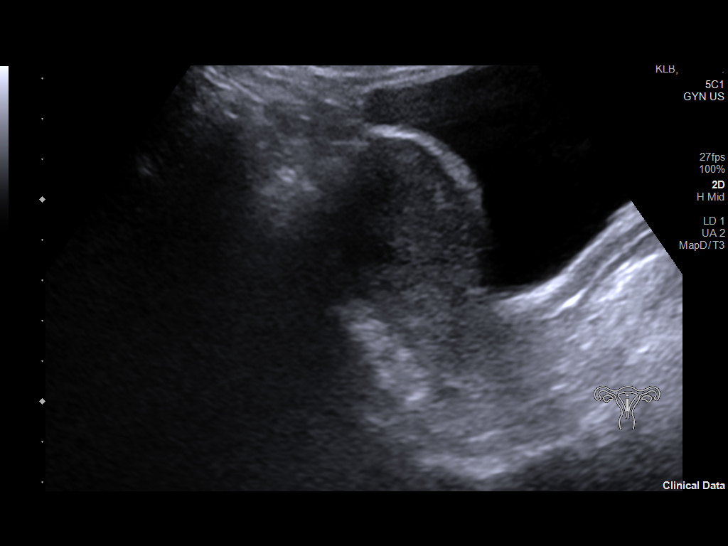
[im 9/106]
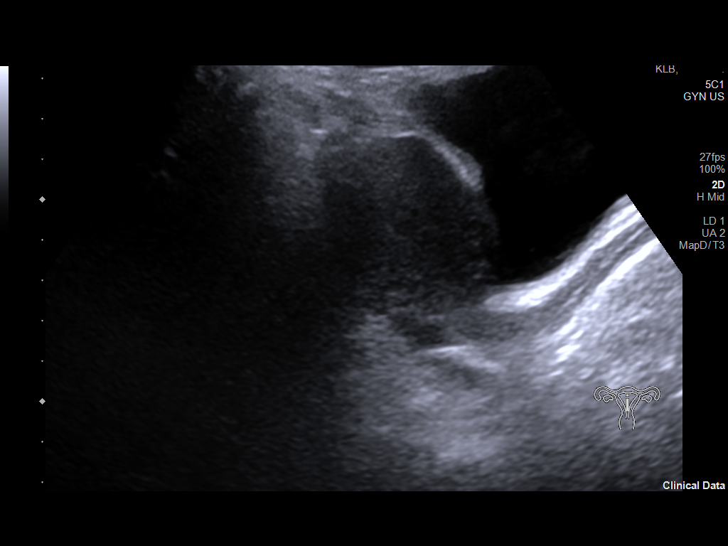
[im 18/106]
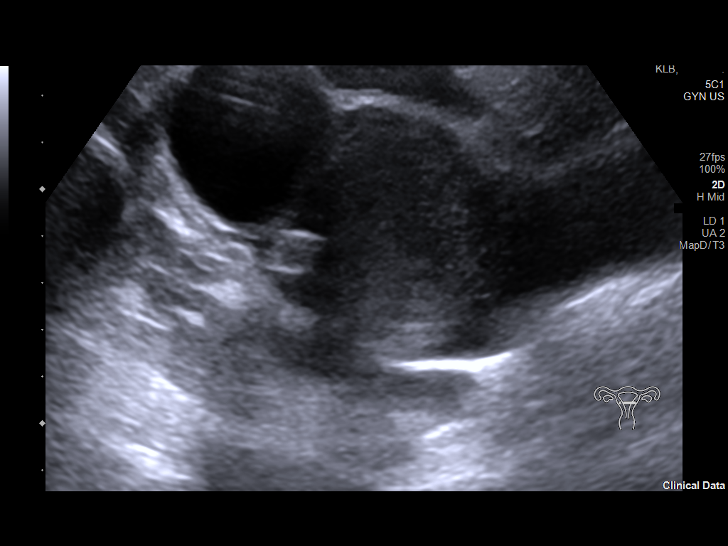
[im 27/106]
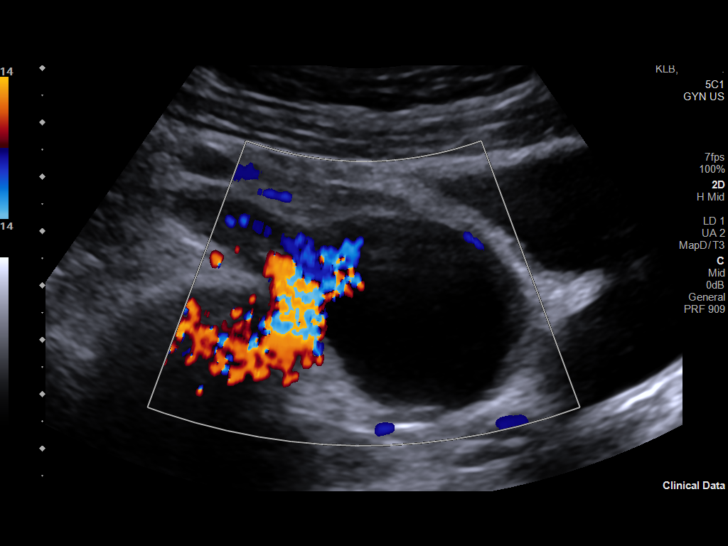
[im 36/106]
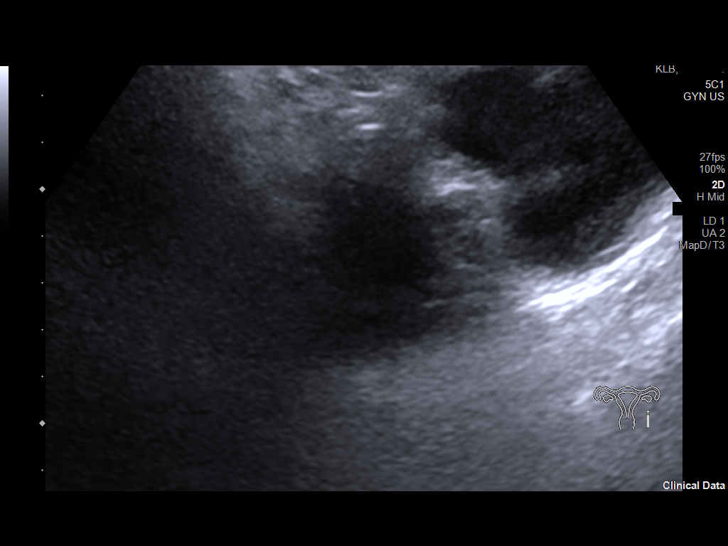
[im 44/106]
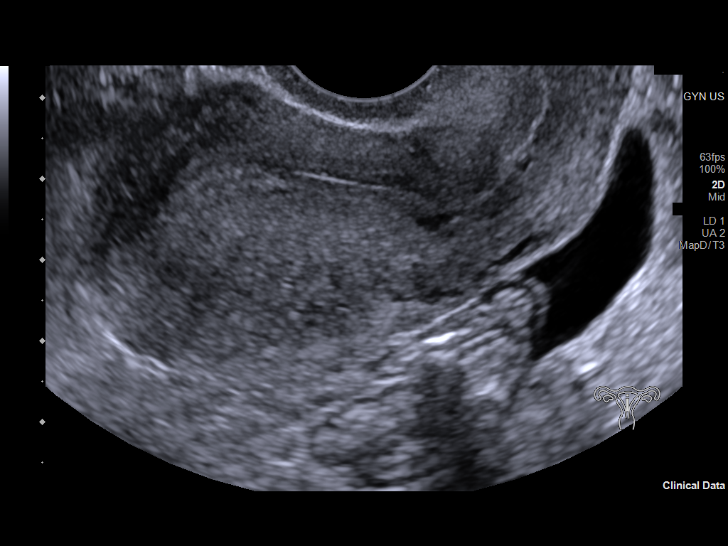
[im 53/106]
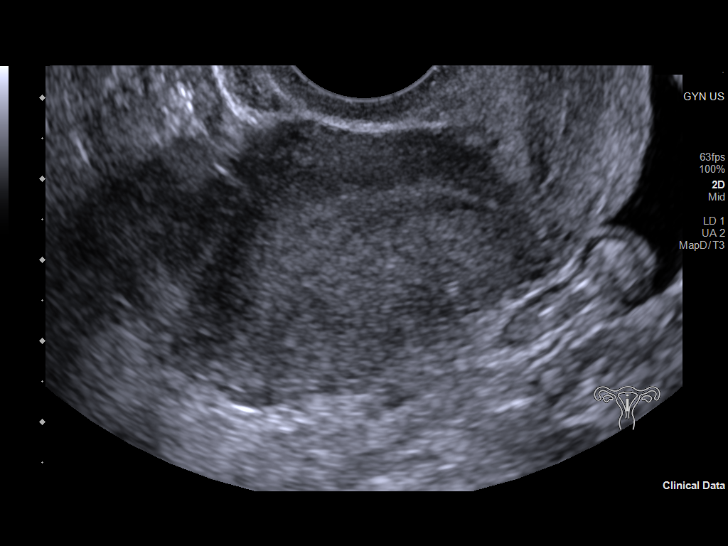
[im 62/106]
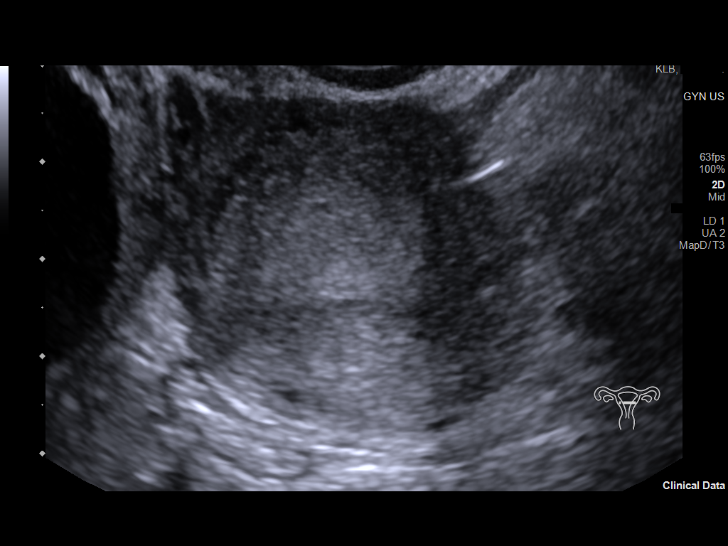
[im 71/106]
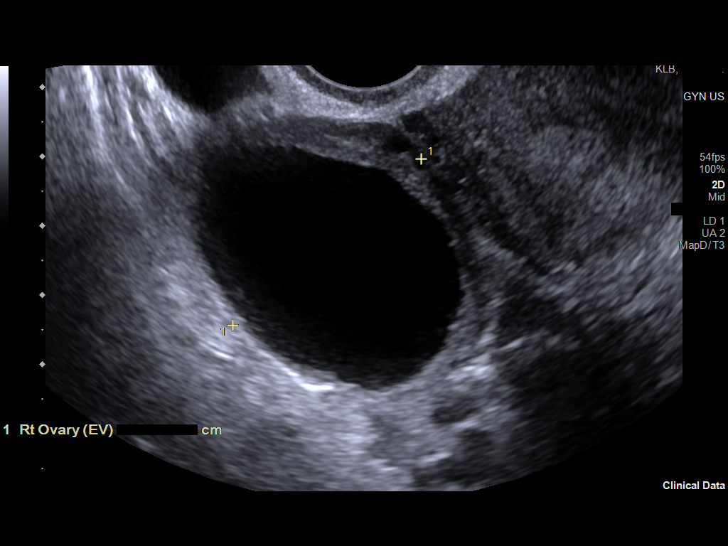
[im 79/106]
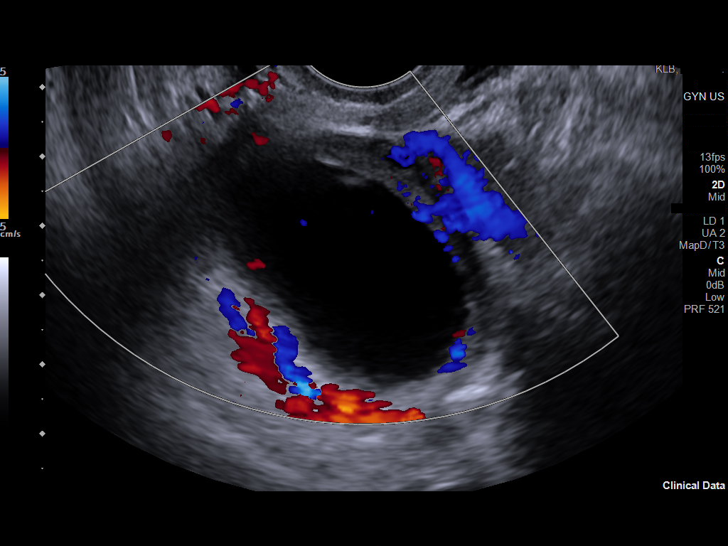
[im 88/106]
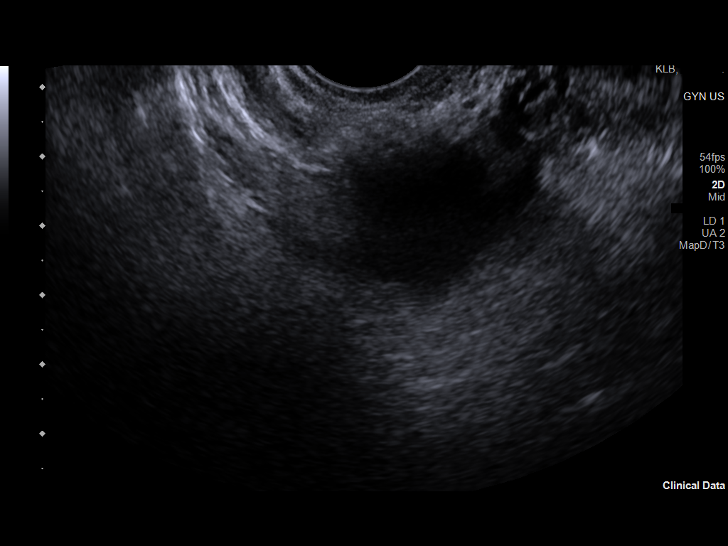
[im 97/106]
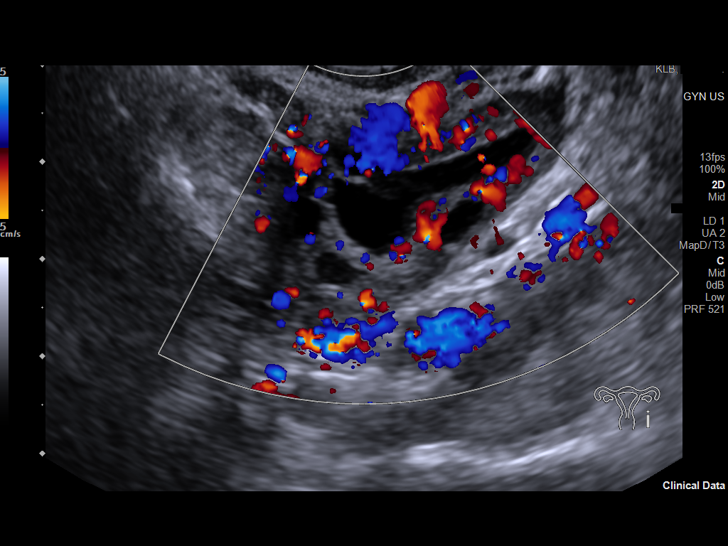
[im 106/106]
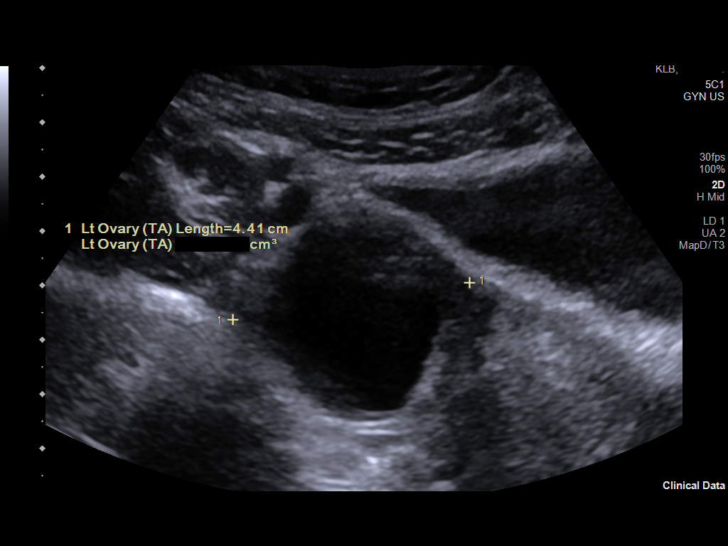

[13 of 25 positions shown; findings below may reference images not displayed]

FINDINGS: Uterus

Measurements: 7.3 x 3.5 x 4.3 cm = volume: 58 mL. No fibroids or
other mass visualized. The uterus is noted to be anteverted.

Endometrium

Thickness: 7 mm.  No focal abnormality visualized.

Right ovary

Measurements: 4.1 x 4.2 x 3.6 cm = volume: 33 mL. There is a 3.6 x
3.2 x 3 cm anechoic lesion within the right ovary that demonstrates
posterior acoustic enhancement consistent with a simple ovarian
cyst.

Left ovary

Measurements: 3.3 x 1.8 x 1.9 cm = volume: 6 mL. Normal
appearance/no adnexal mass.

Pulsed Doppler evaluation of both ovaries demonstrates normal
low-resistance arterial and venous waveforms.

Other findings

Small trace free simple fluid within the pelvis.
IMPRESSION: Unremarkable transabdominal and transvaginal pelvic ultrasound with
a 3.6 cm simple right ovarian cyst. No follow up imaging
recommended. Note: This recommendation does not apply to
premenarchal patients or to those with increased risk (genetic,
family history, elevated tumor markers or other high-risk factors)
of ovarian cancer. Reference: Radiology [DATE]):[id number].

## 2022-01-21 ENCOUNTER — Ambulatory Visit: Payer: Medicaid Other

## 2022-01-21 ENCOUNTER — Telehealth (INDEPENDENT_AMBULATORY_CARE_PROVIDER_SITE_OTHER): Payer: Medicaid Other | Admitting: Obstetrics & Gynecology

## 2022-01-21 ENCOUNTER — Encounter: Payer: Self-pay | Admitting: Obstetrics & Gynecology

## 2022-01-21 VITALS — Ht 62.0 in

## 2022-01-21 DIAGNOSIS — R102 Pelvic and perineal pain: Secondary | ICD-10-CM | POA: Diagnosis not present

## 2022-01-21 DIAGNOSIS — N939 Abnormal uterine and vaginal bleeding, unspecified: Secondary | ICD-10-CM | POA: Diagnosis not present

## 2022-01-21 NOTE — Progress Notes (Signed)
? ?TELEHEALTH VIRTUAL GYN VISIT ENCOUNTER NOTE ?Patient name: Sandy Carter MRN [id number]  Date of birth: August 30, 2001 ? ?I connected with patient on 01/21/22 at  2:10 PM EDT by telephone and verified that I am speaking with the correct person using two identifiers.  Pt is not currently in the office, she is at home.  Provider is in the office.  ?  ?I discussed the limitations, risks, security and privacy concerns of performing an evaluation and management service by telephone and the availability of in person appointments. I also discussed with the patient that there may be a patient responsible charge related to this service. The patient expressed understanding and agreed to proceed. ?  ?Chief Complaint:   ?still having vaginal bleeding; C section 10/19/21 ? ?History of Present Illness:   ?Sandy Carter is a 21 y.o. G39P1001 female being evaluated today for the following concerns:  ? ?Irregular bleeding:  She reports that she has not stopped bleeding since her C-section completed on 10/19/21.  Of note, she initially had a Depot s/p delivery, but noted allergic reaction.  She was then supposed to transition on the patch, which she did not start.  On a given day, using about 3 panty-liners/per day. Each about 70-80% saturated. ? ?Pelvic pain:  This also continues to be an ongoing concern since her surgeries (C-section and appy).   Pain is around her incision and mostly on the left, but can be on both sides.  Describes it as burning sensation.  No improvement with OTC medication.  Notes considerable dyspareunia.  She had tried Gabapentin, but did not feel like it helped.  Since her C-section postop she notes feeling like "something isn't right."   ? ? ?  08/16/2021  ?  9:48 AM 05/01/2021  ?  3:16 PM 03/13/2021  ?  1:54 PM  ?Depression screen PHQ 2/9  ?Decreased Interest 0 0 1  ?Down, Depressed, Hopeless 1 0 2  ?PHQ - 2 Score 1 0 3  ?Altered sleeping 1 3 3   ?Tired, decreased energy 2 3 3   ?Change in appetite 2 0 0   ?Feeling bad or failure about yourself  0 0 0  ?Trouble concentrating 2 3 0  ?Moving slowly or fidgety/restless 0 0 0  ?Suicidal thoughts 0 0 0  ?PHQ-9 Score 8 9 9   ? ? ?No LMP recorded. (Menstrual status: Lactating). ?The current method of family planning is abstinence.  ?Last pap 05/01/21. Results were:  NILM w/ HRHPV negative ?Review of Systems:   ?Pertinent items are noted in HPI ?Denies fever/chills, dizziness, headaches, visual disturbances, fatigue, shortness of breath, chest pain, abdominal pain, vomiting, abnormal vaginal discharge/itching/odor/irritation, problems with periods, bowel movements, urination, or intercourse unless otherwise stated above.  ?Pertinent History Reviewed:  ?Reviewed past medical,surgical, social, obstetrical and family history.  ?Reviewed problem list, medications and allergies. ?Physical Assessment:  ? ?Vitals:  ? 01/21/22 1414  ?Height: 5\' 2"  (1.575 m)  ?Body mass index is 22.86 kg/m?. ? ?     Physical Examination:   ?General:  Alert, oriented and cooperative.   ?Mental Status: Normal mood and affect perceived. Normal judgment and thought content.  ?Physical exam deferred due to nature of the encounter ? ?No results found for this or any previous visit (from the past 24 hour(s)).  ?Assessment & Plan:  ?1) Irregular bleeding ?-would consider r/o underlying etiology- TSH, hCG, prolactin ?-advised starting patch, which it sounds as though she did not want to do ? ?2) Pelvic pain ?-  encouraged pt to come in for full exam ?-possible trigger point injection with Dr. Despina Hidden ?-may consider imaging for further evaluation if indicated ? ?Plan to schedule at next available ? ? ?I discussed the assessment and treatment plan with the patient. The patient was provided an opportunity to ask questions and all were answered. The patient agreed with the plan and demonstrated an understanding of the instructions. ?  ?The patient was advised to call back or seek an in-person evaluation/go to the ED if  the symptoms worsen or if the condition fails to improve as anticipated. ? ?I provided 10 minutes of non-face-to-face time during this encounter. ? ? ?Myna Hidalgo, DO ?Attending Obstetrician & Gynecologist, Faculty Practice ?Center for [employer], [facility] ? ? ?

## 2022-02-05 ENCOUNTER — Ambulatory Visit: Payer: Medicaid Other | Admitting: Obstetrics & Gynecology

## 2022-02-05 ENCOUNTER — Encounter: Payer: Self-pay | Admitting: Obstetrics & Gynecology

## 2022-02-05 VITALS — BP 117/75 | HR 82 | Ht 62.0 in | Wt 131.0 lb

## 2022-02-05 DIAGNOSIS — G5792 Unspecified mononeuropathy of left lower limb: Secondary | ICD-10-CM

## 2022-02-05 DIAGNOSIS — N938 Other specified abnormal uterine and vaginal bleeding: Secondary | ICD-10-CM

## 2022-02-05 MED ORDER — MEGESTROL ACETATE 40 MG PO TABS: ORAL_TABLET | ORAL | 3 refills | Status: DC

## 2022-02-05 NOTE — Progress Notes (Signed)
Trigger Point Injection   Pre-operative diagnosis: Left abdominal wall pain, ilioinguinal nerve on the left, mostly, 20% on right  Post-operative diagnosis: same  After risks and benefits were explained including bleeding, infection, worsening of the pain, damage to the area being injected, weakness, allergic reaction to medications, vascular injection, and nerve damage, signed consent was obtained.  All questions were answered.    The area of the trigger point was identified and the skin prepped three times with alcohol and the alcohol allowed to dry.  Next, a 25 gauge 0.5 inch needle was placed in the area of the trigger point.  Once reproduction of the pain was elicited and negative aspiration confirmed, the trigger point was injected and the needle removed.    The patient did tolerate the procedure well and there were not complications.    Medication used: 20 cc 0.5% marcaine plain   15 minute wait: complete pain resolution Trigger points injected: 1    Trigger point(s) location(s):  bilateral     ICD-10-CM   1. 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 DUB (dysfunctional uterine bleeding)  N93.8    megestrol algorithm

## 2022-02-06 ENCOUNTER — Encounter: Payer: Self-pay | Admitting: Obstetrics & Gynecology

## 2022-02-13 NOTE — BH Specialist Note (Deleted)
Integrated Behavioral Health Initial In-Person Visit  MRN: [id number] Name: Sandy Carter  Number of Integrated Behavioral Health Clinician visits: No data recorded Session Start time: No data recorded   Session End time: No data recorded Total time in minutes: No data recorded  Types of Service: Individual psychotherapy  Interpretor:Yes.   Interpretor Name and Language: Spanish, ***, ***   Warm Hand Off Completed.        Subjective: Sandy Carter is a 21 y.o. female accompanied by  n/a Patient was referred by Myna Hidalgo, DO for depression. Patient reports the following symptoms/concerns: *** Duration of problem: ***; Severity of problem: {Mild/Moderate/Severe:20260}  Objective: Mood: {BHH MOOD:22306} and Affect: {BHH AFFECT:22307} Risk of harm to self or others: {CHL AMB BH Suicide Current Mental Status:21022748}  Life Context: Family and Social: *** School/Work: *** Self-Care: *** Life Changes: Postpartum ***  Patient and/or Family's Strengths/Protective Factors: {CHL AMB BH PROTECTIVE FACTORS:[phone]}  Goals Addressed: Patient will: Reduce symptoms of: {IBH Symptoms:21014056} Increase knowledge and/or ability of: {IBH Patient Tools:21014057}  Demonstrate ability to: {IBH Goals:21014053}  Progress towards Goals: Ongoing  Interventions: Interventions utilized: {IBH Interventions:21014054}  Standardized Assessments completed: {IBH Screening Tools:21014051}  Patient and/or Family Response: Patient agrees with treatment plan.   Patient Centered Plan: Patient is on the following Treatment Plan(s):  IBH  Assessment: Patient currently experiencing ***.   Patient may benefit from psychoeducation and brief therapeutic interventions regarding coping with symptoms of *** .  Plan: Follow up with behavioral health clinician on : Two weeks*** Behavioral recommendations:  -*** -*** Referral(s): {IBH Referrals:21014055}  Aela Bohan C Elward Nocera,  LCSW  ***

## 2022-02-27 ENCOUNTER — Institutional Professional Consult (permissible substitution): Payer: Medicaid Other

## 2022-02-27 ENCOUNTER — Ambulatory Visit: Payer: Medicaid Other | Admitting: Obstetrics & Gynecology

## 2022-02-27 ENCOUNTER — Telehealth: Payer: Self-pay | Admitting: Clinical

## 2022-02-27 ENCOUNTER — Encounter: Payer: Self-pay | Admitting: Obstetrics & Gynecology

## 2022-02-27 VITALS — BP 114/80 | HR 88 | Wt 131.0 lb

## 2022-02-27 DIAGNOSIS — N938 Other specified abnormal uterine and vaginal bleeding: Secondary | ICD-10-CM

## 2022-02-27 DIAGNOSIS — E0590 Thyrotoxicosis, unspecified without thyrotoxic crisis or storm: Secondary | ICD-10-CM | POA: Diagnosis not present

## 2022-02-27 DIAGNOSIS — G5792 Unspecified mononeuropathy of left lower limb: Secondary | ICD-10-CM | POA: Diagnosis not present

## 2022-02-27 MED ORDER — MEGESTROL ACETATE 40 MG PO TABS: ORAL_TABLET | ORAL | 3 refills | Status: DC

## 2022-02-27 MED ORDER — DULOXETINE HCL 30 MG PO CPEP: 30.0000 mg | ORAL_CAPSULE | Freq: Every day | ORAL | 3 refills | Status: DC

## 2022-02-27 NOTE — Telephone Encounter (Signed)
Call to see if pt wanted to switch to virtual visit today, but is requesting to reschedule as she's currently at another appointment. Pt says she called yesterday to reschedule and thought it was already rescheduled; will call back tomorrow when she can look at her schedule.

## 2022-02-28 ENCOUNTER — Other Ambulatory Visit: Payer: Self-pay | Admitting: Obstetrics & Gynecology

## 2022-02-28 LAB — TSH: TSH: 20.7 u[IU]/mL — ABNORMAL HIGH (ref 0.450–4.500)

## 2022-02-28 MED ORDER — LEVOTHYROXINE SODIUM 50 MCG PO TABS: 50.0000 ug | ORAL_TABLET | Freq: Every day | ORAL | 4 refills | Status: DC

## 2022-03-09 ENCOUNTER — Encounter: Payer: Self-pay | Admitting: Women's Health

## 2022-03-30 ENCOUNTER — Encounter: Payer: Self-pay | Admitting: Obstetrics & Gynecology

## 2022-05-06 ENCOUNTER — Encounter: Payer: Self-pay | Admitting: Women's Health

## 2022-05-07 ENCOUNTER — Ambulatory Visit: Payer: Medicaid Other | Admitting: Obstetrics & Gynecology

## 2022-05-07 ENCOUNTER — Encounter: Payer: Self-pay | Admitting: Obstetrics & Gynecology

## 2022-05-07 ENCOUNTER — Encounter: Payer: Self-pay | Admitting: *Deleted

## 2022-05-07 VITALS — BP 125/83 | HR 82 | Ht 62.0 in | Wt 121.6 lb

## 2022-05-07 DIAGNOSIS — Z3202 Encounter for pregnancy test, result negative: Secondary | ICD-10-CM | POA: Diagnosis not present

## 2022-05-07 DIAGNOSIS — N926 Irregular menstruation, unspecified: Secondary | ICD-10-CM

## 2022-05-07 DIAGNOSIS — R3989 Other symptoms and signs involving the genitourinary system: Secondary | ICD-10-CM

## 2022-05-07 LAB — POCT URINE PREGNANCY: Preg Test, Ur: NEGATIVE

## 2022-05-07 NOTE — Progress Notes (Signed)
   GYN VISIT Patient name: Sandy Carter MRN [id number]  Date of birth: [birth year] Chief Complaint:   Follow-up (Problems with birth control, periods not normal. )  History of Present Illness:   Sandy Carter is a 21 y.o. G41P1001 female being seen today for the following concerns:  Abnormal bleeding:  She has been on the patch for the last several months without too much issue.  Then, a few days ago, after having IC noted light pink on pantyliner.  Now this am has noted more BRB/moderate bleeding.  Patch was removed this past Friday- typically normally BRB not spotting. Additionally, she routinely she gets headaches and cramping, but not this time. She had spotting when she got pregnant with her son and was concerned that she may be pregnant.  Never missed using patch or issues with adherence.  Without patch- typically moderate to heavy bleeding lasting 5 days- changing every 4-5 hours.  She denies nausea/vomiting.  Denies pelvic or abdominal pain.  No other acute complaints  She is also concerned about UTI- due to urinary discomfort.  Denies fever/chills.  Denies back pain.  Patient's last menstrual period was 05/07/2022.     08/16/2021    9:48 AM 05/01/2021    3:16 PM 03/13/2021    1:54 PM  Depression screen PHQ 2/9  Decreased Interest 0 0 1  Down, Depressed, Hopeless 1 0 2  PHQ - 2 Score 1 0 3  Altered sleeping 1 3 3   Tired, decreased energy 2 3 3   Change in appetite 2 0 0  Feeling bad or failure about yourself  0 0 0  Trouble concentrating 2 3 0  Moving slowly or fidgety/restless 0 0 0  Suicidal thoughts 0 0 0  PHQ-9 Score 8 9 9      Review of Systems:   Pertinent items are noted in HPI Denies fever/chills, dizziness, headaches, visual disturbances, fatigue, shortness of breath, chest pain, abdominal pain, vomiting. Pertinent History Reviewed:  Reviewed past medical,surgical, social, obstetrical and family history.  Reviewed problem list, medications and  allergies. Physical Assessment:   Vitals:   05/07/22 1333 05/07/22 1339  BP: (!) 142/80 125/83  Pulse: 84 82  Weight: 121 lb 9.6 oz (55.2 kg)   Height: 5\' 2"  (1.575 m)   Body mass index is 22.24 kg/m.       Physical Examination:   General appearance: alert, well appearing, and in no distress  Psych: mood appropriate, normal affect  Skin: warm & dry   Cardiovascular: normal heart rate noted  Respiratory: normal respiratory effort, no distress  Abdomen: soft, non-tender   Extremities: no edema, no calf tenderness bilaterally  Chaperone: N/A    Assessment & Plan:  1) AUB -negative pregnancy test -reassured pt that some irregular bleeding with contraception can be normal -continue regular use of patch -should continue irregular bleeding continue- RTC  2) Urinary concerns -will r/o underlying infection   Orders Placed This Encounter  Procedures   Urine Culture   Urinalysis   POCT urine pregnancy    Return in about 1 year (around 05/08/2023) for Annual.   05/09/22, DO Attending Obstetrician & Gynecologist, Faculty Practice Center for [facility], [facility]

## 2022-05-10 LAB — URINALYSIS
Bilirubin, UA: NEGATIVE
Glucose, UA: NEGATIVE
Ketones, UA: NEGATIVE
Nitrite, UA: POSITIVE — AB
Specific Gravity, UA: 1.02 (ref 1.005–1.030)
Urobilinogen, Ur: 1 mg/dL (ref 0.2–1.0)
pH, UA: 6.5 (ref 5.0–7.5)

## 2022-05-10 LAB — URINE CULTURE

## 2022-05-10 LAB — SPECIMEN STATUS REPORT

## 2022-05-11 ENCOUNTER — Other Ambulatory Visit: Payer: Self-pay | Admitting: Obstetrics & Gynecology

## 2022-05-11 DIAGNOSIS — N3000 Acute cystitis without hematuria: Secondary | ICD-10-CM

## 2022-05-11 MED ORDER — NITROFURANTOIN MONOHYD MACRO 100 MG PO CAPS: 100.0000 mg | ORAL_CAPSULE | Freq: Two times a day (BID) | ORAL | 0 refills | Status: AC

## 2022-05-11 NOTE — Progress Notes (Signed)
Rx sent in for UTI.

## 2022-06-02 ENCOUNTER — Ambulatory Visit: Payer: Medicaid Other | Admitting: Obstetrics & Gynecology

## 2022-08-02 ENCOUNTER — Encounter: Payer: Self-pay | Admitting: Obstetrics & Gynecology

## 2022-08-04 ENCOUNTER — Ambulatory Visit (INDEPENDENT_AMBULATORY_CARE_PROVIDER_SITE_OTHER): Payer: Medicaid Other | Admitting: *Deleted

## 2022-08-04 VITALS — BP 118/78 | HR 92

## 2022-08-04 DIAGNOSIS — N926 Irregular menstruation, unspecified: Secondary | ICD-10-CM | POA: Diagnosis not present

## 2022-08-04 DIAGNOSIS — Z3201 Encounter for pregnancy test, result positive: Secondary | ICD-10-CM | POA: Diagnosis not present

## 2022-08-04 DIAGNOSIS — Z789 Other specified health status: Secondary | ICD-10-CM | POA: Diagnosis not present

## 2022-08-04 LAB — POCT URINE PREGNANCY: Preg Test, Ur: POSITIVE — AB

## 2022-08-04 NOTE — Progress Notes (Signed)
   NURSE VISIT- PREGNANCY CONFIRMATION   SUBJECTIVE:  Sandy BENEDETTI is a 21 y.o. G75P1001 female. Patient's last menstrual period was 06/17/2022 (approximate). Bleed for 4 days then had light bleeding again on 07/01/22 for another 4 days. Here for pregnancy confirmation.  Home pregnancy test: positive x 2   She reports no complaints.  She is taking prenatal vitamins.    OBJECTIVE:  BP 118/78 (BP Location: Right Arm, Patient Position: Sitting, Cuff Size: Normal)   Pulse 92   LMP 06/17/2022 (Approximate) Comment: also bleed 10/24 for 4 days  Breastfeeding No   Appears well, in no apparent distress  Results for orders placed or performed in visit on 08/04/22 (from the past 24 hour(s))  POCT urine pregnancy   Collection Time: 08/04/22  4:38 PM  Result Value Ref Range   Preg Test, Ur Positive (A) Negative    ASSESSMENT: Positive pregnancy test with approximate LMP 06/17/22  PLAN: Schedule for dating ultrasound in 3 weeks but will drawn HCG since period could be off Prenatal vitamins: continue   Nausea medicines: not currently needed   OB packet given: Yes  Jobe Marker  08/04/2022 4:42 PM

## 2022-08-05 LAB — BETA HCG QUANT (REF LAB): hCG Quant: 3126 m[IU]/mL

## 2022-08-20 ENCOUNTER — Encounter: Payer: Self-pay | Admitting: *Deleted

## 2022-08-20 DIAGNOSIS — Z3490 Encounter for supervision of normal pregnancy, unspecified, unspecified trimester: Secondary | ICD-10-CM | POA: Insufficient documentation

## 2022-08-22 ENCOUNTER — Encounter: Payer: Medicaid Other | Admitting: *Deleted

## 2022-08-22 ENCOUNTER — Other Ambulatory Visit: Payer: Medicaid Other

## 2022-08-22 IMAGING — CT CT ANGIO CHEST
2 of 6 series · 19 of 36 positions shown · IV contrast (Omnipaque or Isovue)
Comparison: None.

CLINICAL DATA: Recent back surgery. Persistent nausea and vomiting.
Chest pain.

EXAM:
CT ANGIOGRAPHY CHEST WITH CONTRAST
TECHNIQUE: Multidetector CT imaging of the chest was performed using the
standard protocol during bolus administration of intravenous
contrast. Multiplanar CT image reconstructions and MIPs were
obtained to evaluate the vascular anatomy.
CONTRAST:  75mL OMNIPAQUE IOHEXOL 350 MG/ML SOLN

[Series 5: pe axial thins · axial · 0.62mm/px · z∈[+1285,+1556]mm · 18 of 374 slices shown]
[im 18/374  lung]
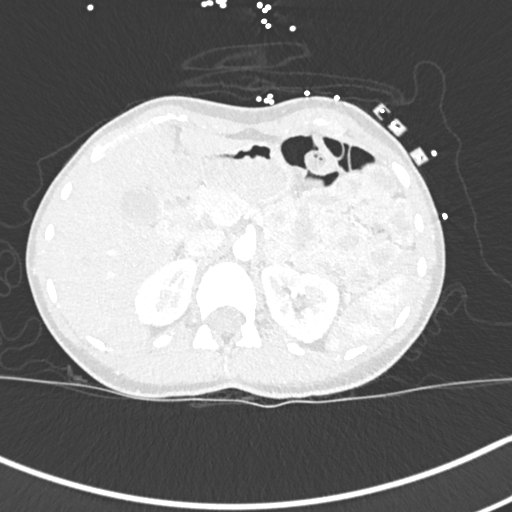
[im 36/374  mediastinal]
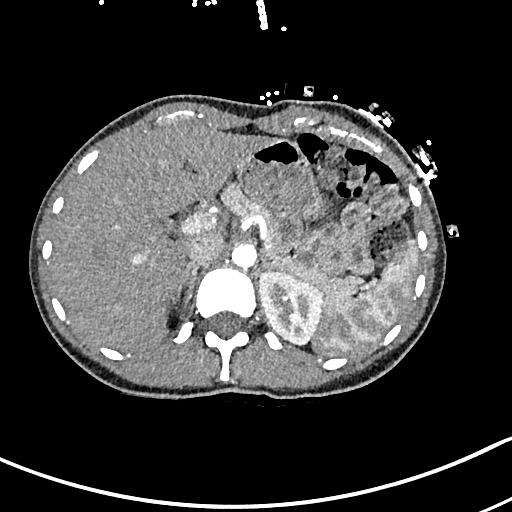
[im 54/374  lung]
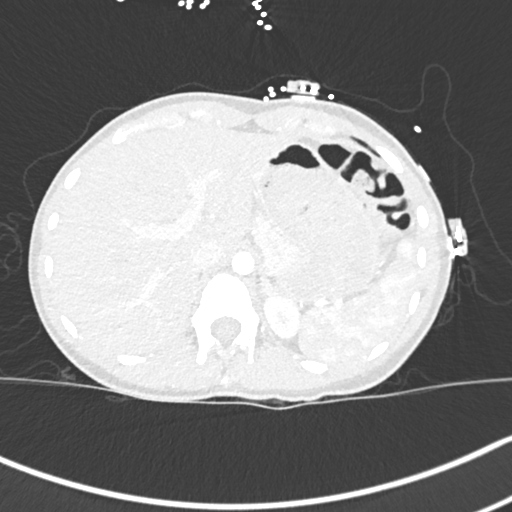
[im 72/374  mediastinal]
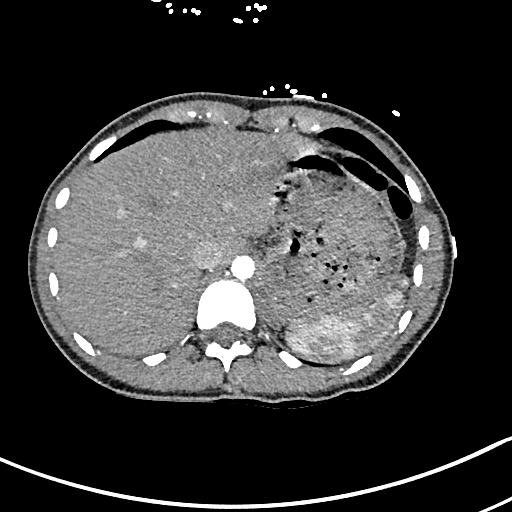
[im 107/374  lung]
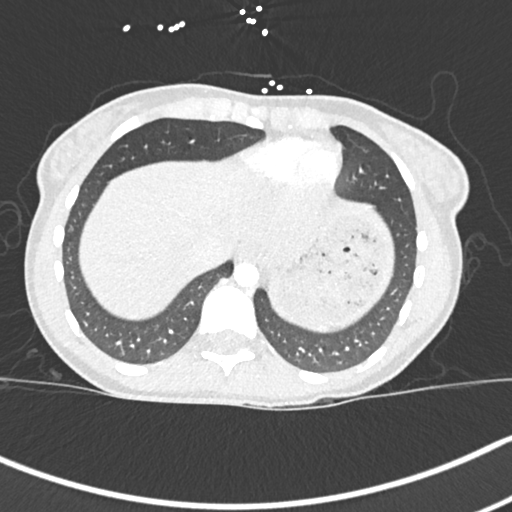
[im 125/374  mediastinal]
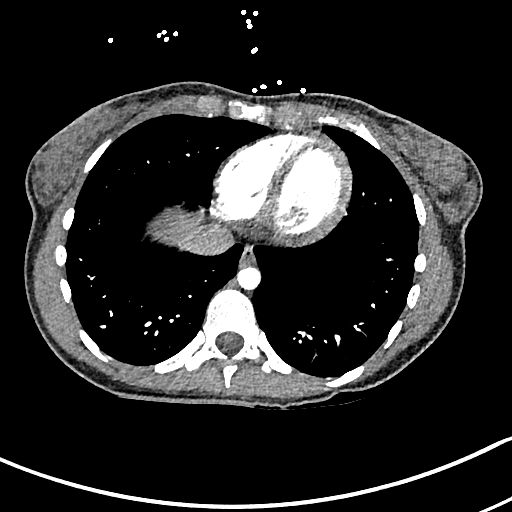
[im 143/374  lung]
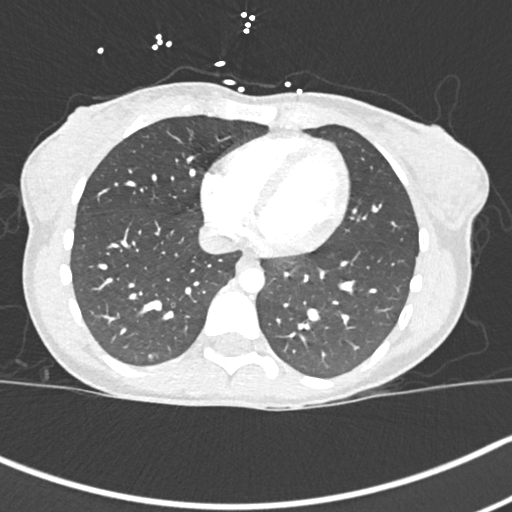
[im 160/374  mediastinal]
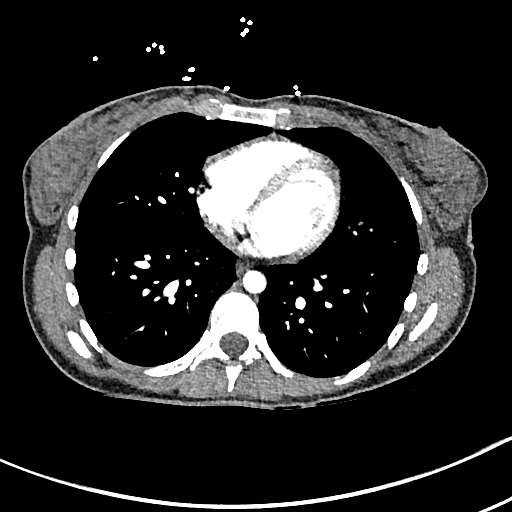
[im 178/374  lung]
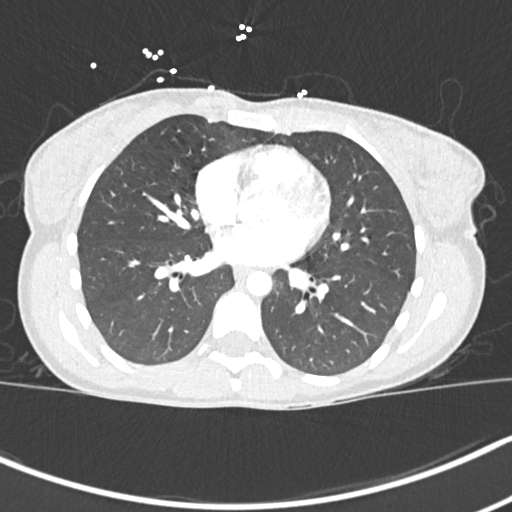
[im 196/374  mediastinal]
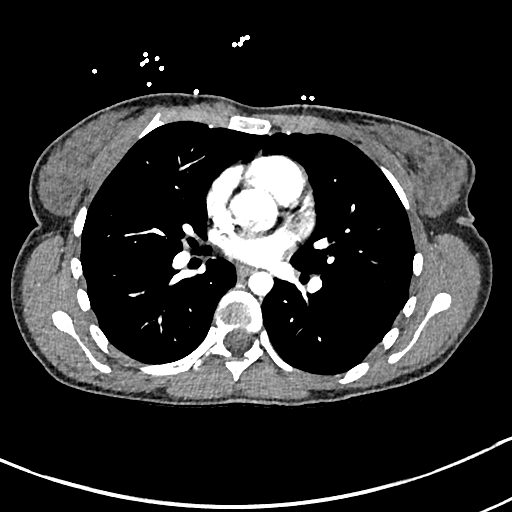
[im 214/374  lung]
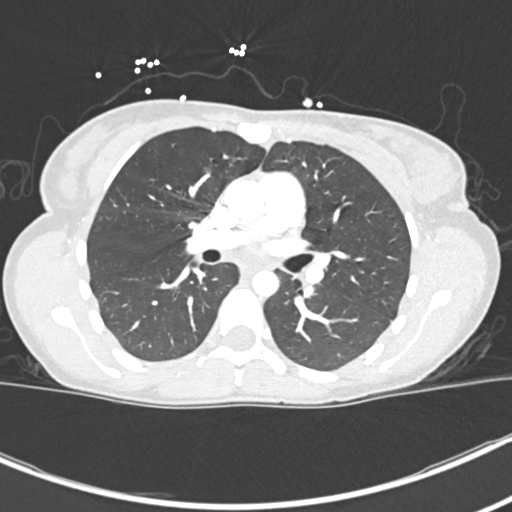
[im 231/374  mediastinal]
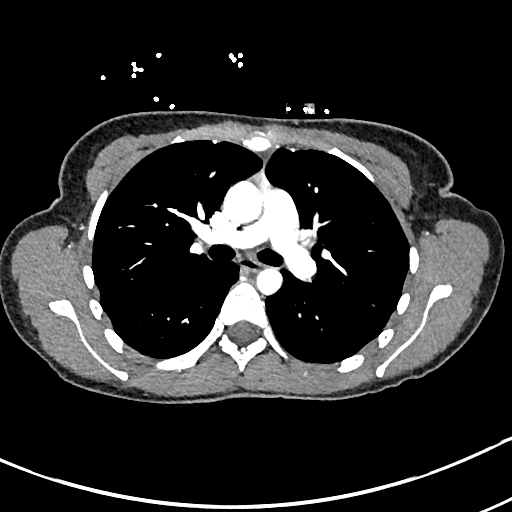
[im 249/374  lung]
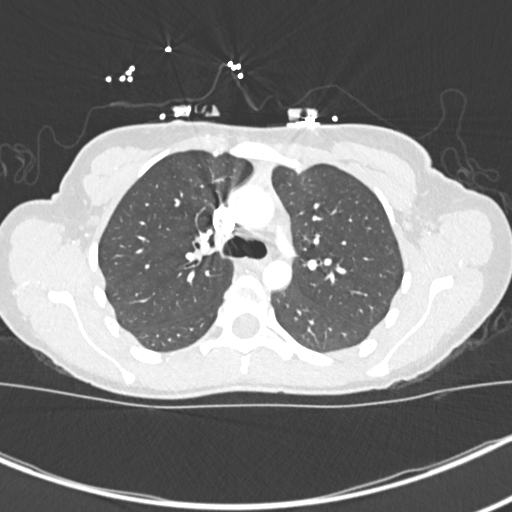
[im 285/374  mediastinal]
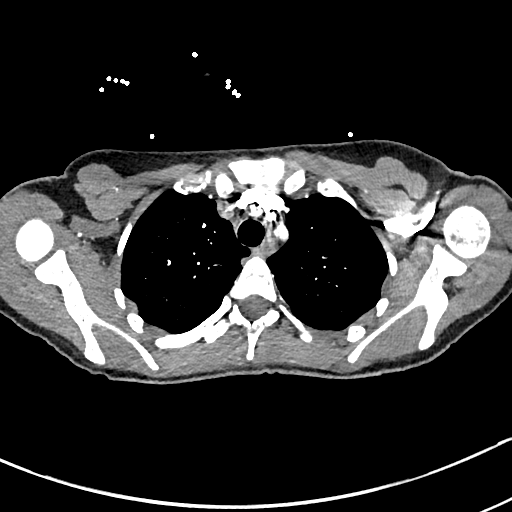
[im 302/374  lung]
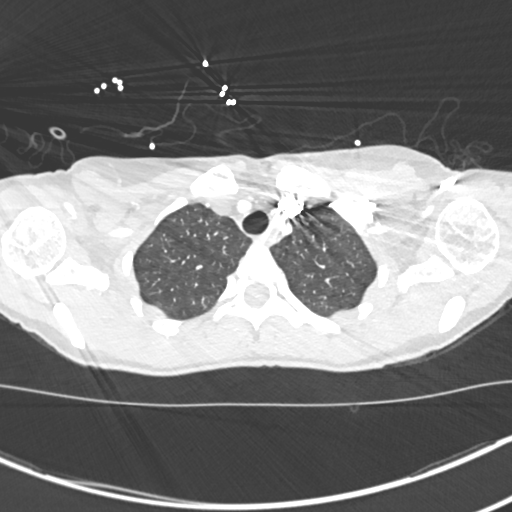
[im 320/374  mediastinal]
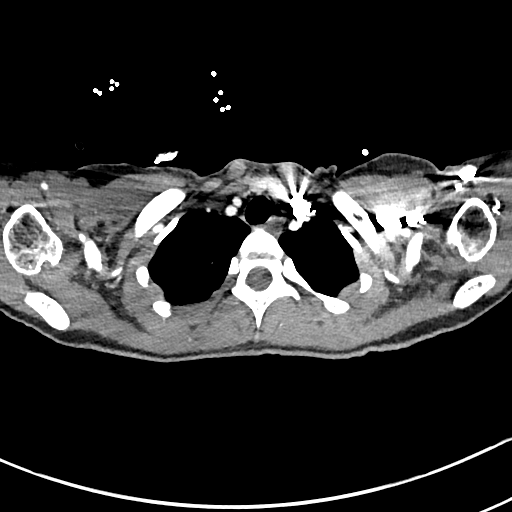
[im 338/374  lung]
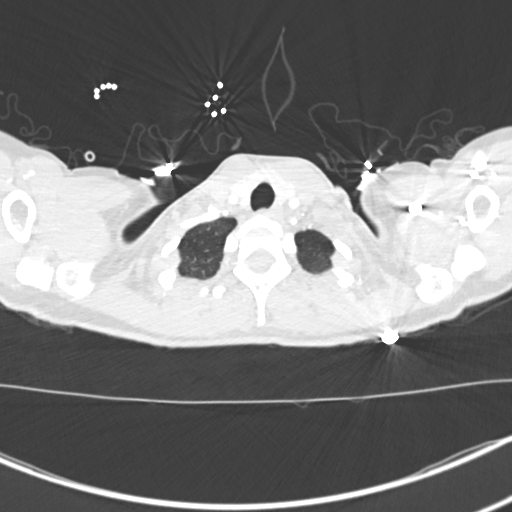
[im 356/374  mediastinal]
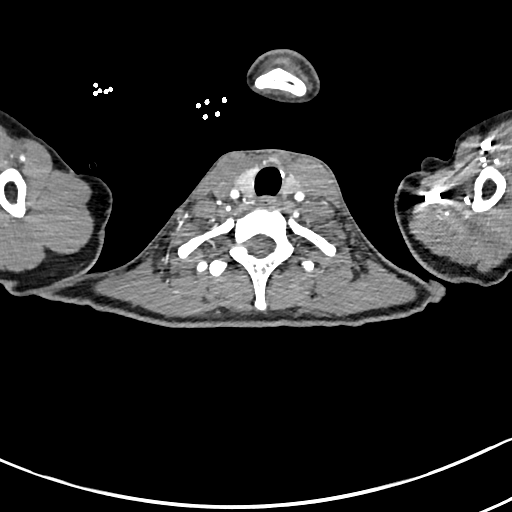

[Series 8: cor soft · coronal · 0.66mm/px · 1 of 117 slices shown]
[im 59/117  mediastinal]
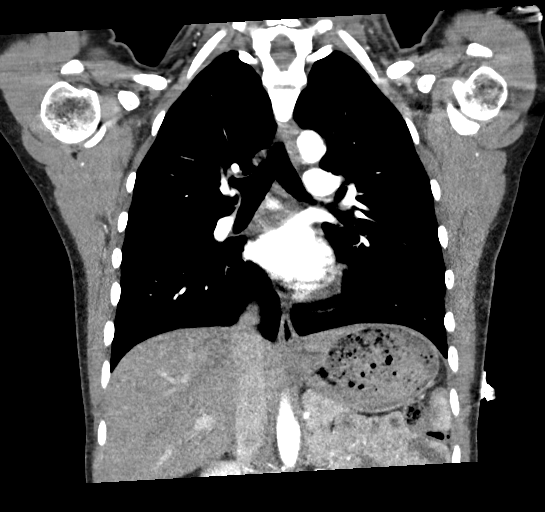

[19 of 36 positions shown; findings below may reference images not displayed]

FINDINGS: Cardiovascular: No pulmonary embolism within the main, lobar or
segmental pulmonary arteries bilaterally. No thoracic aortic
aneurysm or evidence of aortic dissection. Heart size appears
normal. No pericardial effusion.

Mediastinum/Nodes: No mass or enlarged lymph nodes are seen within
the mediastinum. Esophagus is unremarkable. Trachea and central
bronchi are unremarkable.

Lungs/Pleura: Lungs are clear.  No pleural effusion or pneumothorax.

Upper Abdomen: Limited images of the upper abdomen are unremarkable.

Musculoskeletal: Osseous structures of the chest and upper abdomen
are normal. Superficial soft tissues of the chest wall are
unremarkable

Review of the MIP images confirms the above findings.
IMPRESSION: Normal exam. No pulmonary embolism. Lungs are clear.

## 2022-08-22 IMAGING — DX DG CHEST 1V PORT
1 series · 1 of 1 positions shown · non-contrast
Comparison: None.

CLINICAL DATA: Acute chest pain for 3 days.

EXAM:
PORTABLE CHEST 1 VIEW

[chest ap]
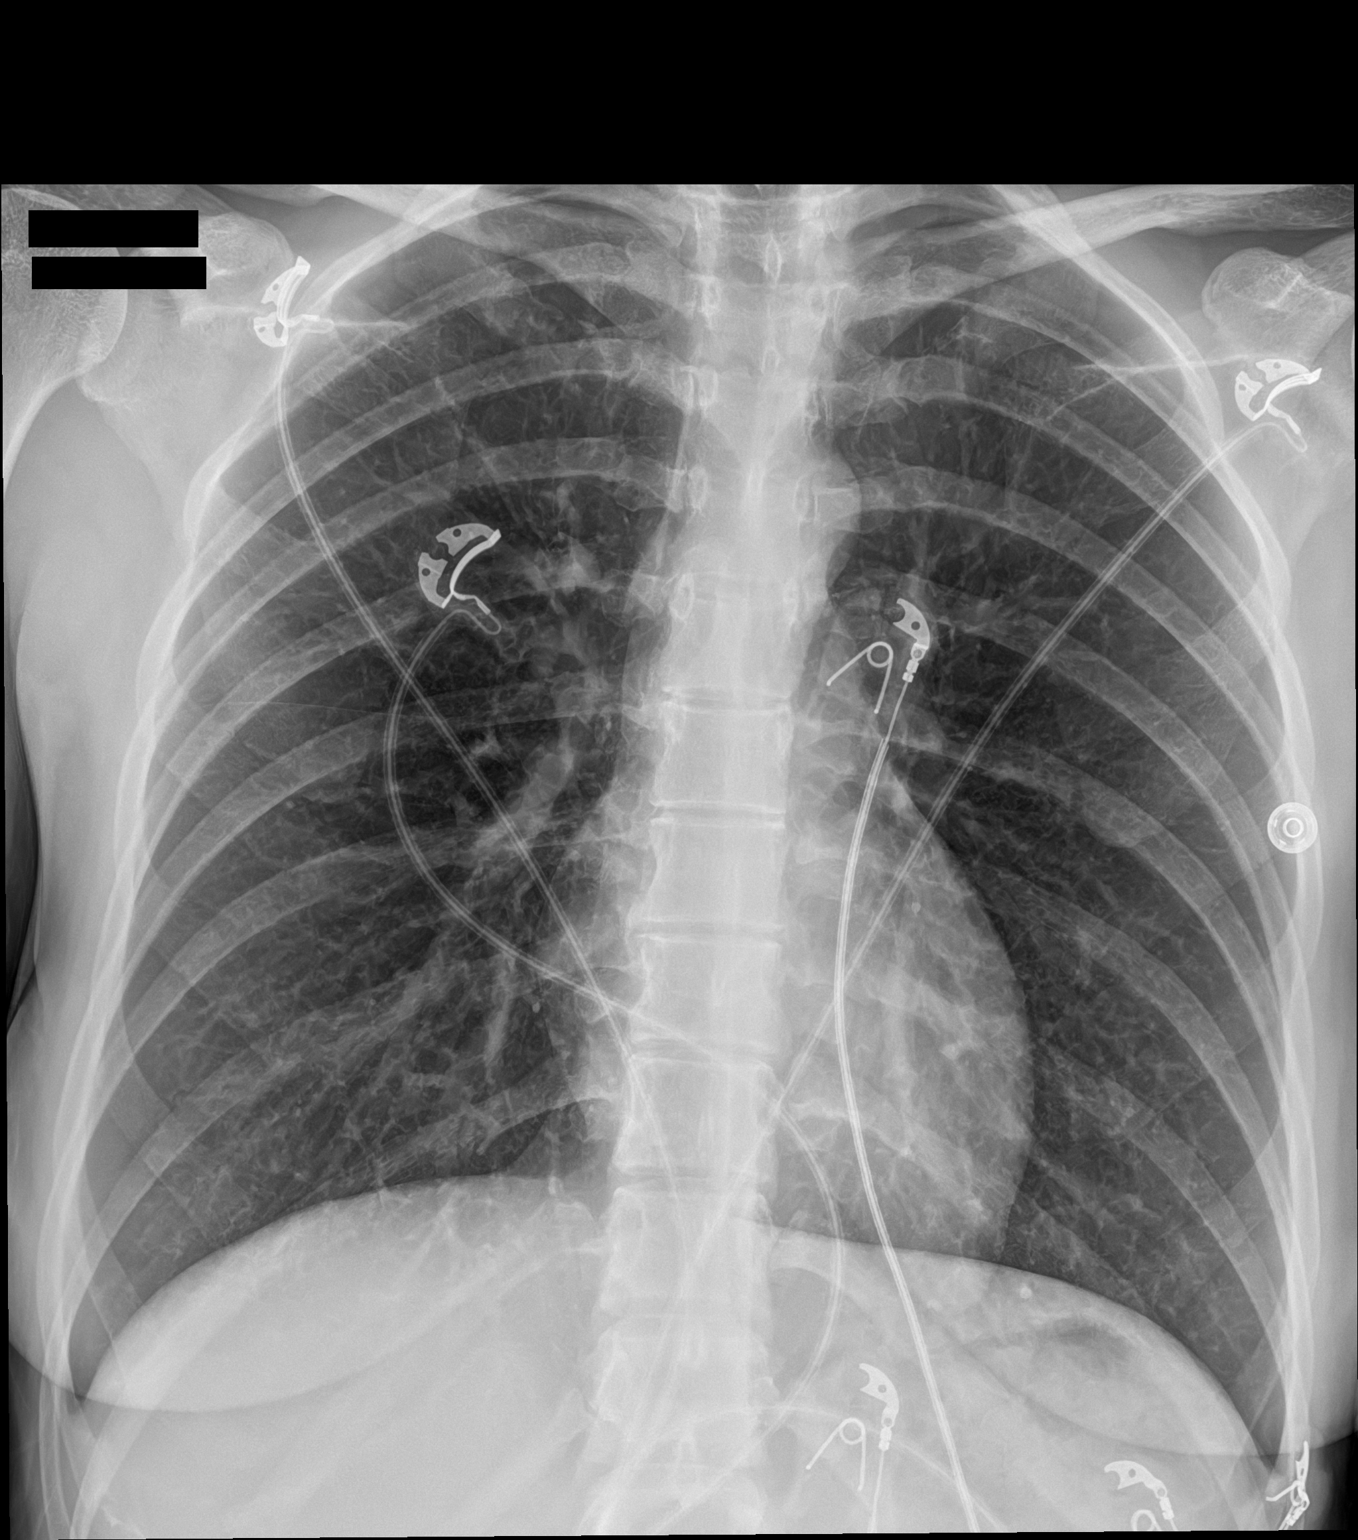

[1 of 1 positions shown; findings below may reference images not displayed]

FINDINGS: The cardiomediastinal silhouette is unremarkable.

There is no evidence of focal airspace disease, pulmonary edema,
suspicious pulmonary nodule/mass, pleural effusion, or pneumothorax.

No acute bony abnormalities are identified.
IMPRESSION: No active disease.

## 2022-08-26 ENCOUNTER — Other Ambulatory Visit: Payer: Self-pay | Admitting: Obstetrics & Gynecology

## 2022-08-26 DIAGNOSIS — O3680X0 Pregnancy with inconclusive fetal viability, not applicable or unspecified: Secondary | ICD-10-CM

## 2022-08-27 ENCOUNTER — Other Ambulatory Visit: Payer: Self-pay | Admitting: Obstetrics & Gynecology

## 2022-08-27 ENCOUNTER — Other Ambulatory Visit: Payer: Medicaid Other

## 2022-08-27 ENCOUNTER — Ambulatory Visit (INDEPENDENT_AMBULATORY_CARE_PROVIDER_SITE_OTHER): Payer: Medicaid Other

## 2022-08-27 DIAGNOSIS — O26851 Spotting complicating pregnancy, first trimester: Secondary | ICD-10-CM

## 2022-08-27 DIAGNOSIS — Z3A10 10 weeks gestation of pregnancy: Secondary | ICD-10-CM

## 2022-08-27 DIAGNOSIS — O3680X0 Pregnancy with inconclusive fetal viability, not applicable or unspecified: Secondary | ICD-10-CM | POA: Diagnosis not present

## 2022-08-27 DIAGNOSIS — Z3490 Encounter for supervision of normal pregnancy, unspecified, unspecified trimester: Secondary | ICD-10-CM

## 2022-08-27 NOTE — Progress Notes (Signed)
[place] 6 wks,single IUP with yolk sac,irregular gestational sac 22 mm,CRL 3.5 mm,no FHT visualized,Dr Ozan discussed results with patient

## 2022-08-29 LAB — BETA HCG QUANT (REF LAB): hCG Quant: 29942 m[IU]/mL

## 2022-09-05 ENCOUNTER — Other Ambulatory Visit: Payer: Self-pay | Admitting: Obstetrics & Gynecology

## 2022-09-05 DIAGNOSIS — O3680X0 Pregnancy with inconclusive fetal viability, not applicable or unspecified: Secondary | ICD-10-CM

## 2022-09-09 ENCOUNTER — Ambulatory Visit: Payer: Medicaid Other | Admitting: Obstetrics & Gynecology

## 2022-09-09 ENCOUNTER — Ambulatory Visit (INDEPENDENT_AMBULATORY_CARE_PROVIDER_SITE_OTHER): Payer: Medicaid Other

## 2022-09-09 ENCOUNTER — Encounter: Payer: Medicaid Other | Admitting: Obstetrics & Gynecology

## 2022-09-09 ENCOUNTER — Ambulatory Visit (INDEPENDENT_AMBULATORY_CARE_PROVIDER_SITE_OTHER): Payer: Medicaid Other | Admitting: Obstetrics & Gynecology

## 2022-09-09 ENCOUNTER — Encounter: Payer: Self-pay | Admitting: Obstetrics & Gynecology

## 2022-09-09 VITALS — BP 111/69 | HR 90 | Ht 62.0 in | Wt 123.0 lb

## 2022-09-09 DIAGNOSIS — O021 Missed abortion: Secondary | ICD-10-CM

## 2022-09-09 DIAGNOSIS — Z3A01 Less than 8 weeks gestation of pregnancy: Secondary | ICD-10-CM

## 2022-09-09 DIAGNOSIS — O3680X0 Pregnancy with inconclusive fetal viability, not applicable or unspecified: Secondary | ICD-10-CM | POA: Diagnosis not present

## 2022-09-09 DIAGNOSIS — Z3A12 12 weeks gestation of pregnancy: Secondary | ICD-10-CM

## 2022-09-09 MED ORDER — KETOROLAC TROMETHAMINE 10 MG PO TABS: 10.0000 mg | ORAL_TABLET | Freq: Three times a day (TID) | ORAL | 0 refills | Status: DC | PRN

## 2022-09-09 NOTE — Progress Notes (Signed)
Follow up appointment for results: Sonogram  Chief Complaint  Patient presents with   discuss [place]    Blood pressure 111/69, pulse 90, height 5\' 2"  (1.575 m), weight 123 lb (55.8 kg), last menstrual period 06/17/2022, not currently breastfeeding.  US OB Transvaginal  Result Date: 09/09/2022 Table formatting from the original result was not included. Images from the original result were not included.  ..an Sales executive of Ultrasound Medicine Diplomatic Services operational officer) accredited practice Center for [facility] @ [facility] [place] [address] Ordering Provider: Janyth Pupa, DO FOLLOW UP SONOGRAM Sandy Carter is in the office for a follow up sonogram for viability. She is a 22 y.o. year old G2P1001 with Estimated Date of Delivery: 03/24/2023 by LMP now at  [redacted] weeks gestation. Thus far the pregnancy has been complicated by no fetal heart tones on previous ultrasound . GESTATION: SINGLETON FETAL ACTIVITY:          Heart rate         no fetal heart tones          CERVIX: Appears closed ADNEXA: The ovaries are normal. GESTATIONAL AGE AND  BIOMETRICS: Gestational criteria: Estimated Date of Delivery: 03/24/2023 by LMP now at 12 wks Previous Scans:1 GESTATIONAL SAC           24.2 mm         7+3 weeks CROWN RUMP LENGTH           2.06 mm         5+5 weeks                                                                      AVERAGE EGA(BY THIS SCAN):  5+5 weeks                                               SUSPECTED ABNORMALITIES:  yes QUALITY OF SCAN: satisfactory TECHNICIAN COMMENTS: US TV: 5+5 wks fetal pole,no fetal heart tones visualized,CRL 2.08, irregular gestational sac in lower uterine segment, GS 24.2 mm,normal ovaries,EEC 16 mm,no free fluid A copy of this report including all images has been saved and backed up to a second source for retrieval if needed. All measures and details of the anatomical scan, placentation, fluid volume and pelvic anatomy are contained in that report. Sandy Carter 09/09/2022 4:28 PM Clinical Impression and recommendations: Comparison with study 08/27/22 is performed I have reviewed the sonogram results above, combined with the patient's current clinical course, below are my impressions and any appropriate recommendations for management based on the sonographic findings. Early IUP, non viable, no interval progression is pregnancy size or status from the 08/27/22 study G2P1001 Estimated Date of Delivery: None noted. Normal general sonographic findings Impression is a missed ab This recommendation follows SRU consensus guidelines: Diagnostic Criteria for Nonviable Pregnancy Early in the First Trimester. [employer] Med 2013; 202:5427-06. Patient and her significant other are informed Sandy Carter 09/09/2022 5:19 PM       MEDS ordered this encounter: Meds ordered this encounter  Medications   ketorolac (TORADOL) 10 MG tablet  Sig: Take 1 tablet (10 mg total) by mouth every 8 (eight) hours as needed.    Dispense:  15 tablet    Refill:  0    Orders for this encounter: No orders of the defined types were placed in this encounter.   Impression + Management Plan   ICD-10-CM   1. Missed ab: comparison study to 08/27/22  O02.1    pt informed of diagnosis, wants to manage conservatively for now, no cytotec desired, can call or message to request in coming days if she likes Rx toradol      Follow Up: Return if symptoms worsen or fail to improve.     All questions were answered.  Past Medical History:  Diagnosis Date   ADD (attention deficit disorder)     in her teens   Anxiety    Depression    Dysrhythmia    palpitations - from Hyperthyroidism   Headache    Hyperthyroidism    not treated at this time   Lumbar herniated disc    ODD (oppositional defiant disorder)    in her teens   Seizures ([place])    shakes alot, is aware of what is going on, feels them coming on, has not seen a neurologist   Tuberculosis    before age 53    Past Surgical  History:  Procedure Laterality Date   CESAREAN SECTION N/A 10/19/2021   Procedure: CESAREAN SECTION;  Surgeon: Sandy Pupa, DO;  Location: [facility];  Service: Obstetrics;  Laterality: N/A;   COLONOSCOPY WITH PROPOFOL N/A 09/17/2020   Procedure: COLONOSCOPY WITH PROPOFOL;  Surgeon: Eloise Harman, DO;  [facility];  Service: Endoscopy;  Laterality: N/A;  2:00pm   LAPAROSCOPIC APPENDECTOMY N/A 10/17/2021   Procedure: APPENDECTOMY LAPAROSCOPIC;  Surgeon: Ileana Roup, MD;  Location: [facility];  Service: General;  Laterality: N/A;   LUMBAR LAMINECTOMY/DECOMPRESSION MICRODISCECTOMY Right 12/28/2020   Procedure: Right Lumbar Five Sacral One Microdiscectomy;  Surgeon: Ashok Pall, MD;  Location: [facility];  Service: Neurosurgery;  Laterality: Right;   WISDOM TOOTH EXTRACTION      OB History     Gravida  2   Para  1   Term  1   Preterm      AB      Living  1      SAB      IAB      Ectopic      Multiple      Live Births  1           Allergies  Allergen Reactions   Depo-Provera [Medroxyprogesterone Acetate] Hives, Shortness Of Breath and Cough   Penicillins Rash    Reaction: 5 years   Gabapentin Other (See Comments)    Headaches, lightheaded   Penicillin G Other (See Comments)    Social History   Socioeconomic History   Marital status: Significant Other    Spouse name: Not on file   Number of children: Not on file   Years of education: Not on file   Highest education level: Not on file  Occupational History   Not on file  Tobacco Use   Smoking status: Former    Types: E-cigarettes   Smokeless tobacco: Never  Vaping Use   Vaping Use: Former  Substance and Sexual Activity   Alcohol use: Not Currently    Comment: occ   Drug use: Not Currently    Types: Marijuana   Sexual activity: Yes  Birth control/protection: None  Other Topics Concern   Not on file  Social History Narrative   Not on file   Social Determinants of Health    Financial Resource Strain: Medium Risk (11/26/2021)   Overall Financial Resource Strain (CARDIA)    Difficulty of Paying Living Expenses: Somewhat hard  Food Insecurity: Unknown (11/26/2021)   Hunger Vital Sign    Worried About Running Out of Food in the Last Year: Patient refused    [place] in the Last Year: Patient refused  Transportation Needs: No Transportation Needs (11/26/2021)   PRAPARE - Hydrologist (Medical): No    Lack of Transportation (Non-Medical): No  Physical Activity: Sufficiently Active (11/26/2021)   Exercise Vital Sign    Days of Exercise per Week: 7 days    Minutes of Exercise per Session: 30 min  Stress: Stress Concern Present (11/26/2021)   [place]    Feeling of Stress : Very much  Social Connections: Unknown (11/26/2021)   Social Connection and Isolation Panel [NHANES]    Frequency of Communication with Friends and Family: Once a week    Frequency of Social Gatherings with Friends and Family: Patient refused    Attends Religious Services: Never    Marine scientist or Organizations: No    Attends Music therapist: Never    Marital Status: Living with partner    Family History  Adopted: Yes  Problem Relation Age of Onset   Diabetes Maternal Grandmother    Alcoholism Mother    Bone cancer Mother

## 2022-09-09 NOTE — Progress Notes (Signed)
US TV: 5+5 wks fetal pole,no fetal heart tones visualized,CRL 2.08, irregular gestational sac in lower uterine segment, GS 24.2 mm,normal ovaries,EEC 16 mm,no free fluid

## 2022-09-15 ENCOUNTER — Encounter: Payer: Self-pay | Admitting: Obstetrics & Gynecology

## 2022-09-18 MED ORDER — PROAIR HFA 108 (90 BASE) MCG/ACT IN AERS: 1.0000 | INHALATION_SPRAY | Freq: Four times a day (QID) | RESPIRATORY_TRACT | 1 refills | Status: DC | PRN

## 2022-09-18 MED ORDER — MISOPROSTOL 200 MCG PO TABS: ORAL_TABLET | ORAL | 0 refills | Status: DC

## 2022-10-16 ENCOUNTER — Encounter: Payer: Self-pay | Admitting: Obstetrics & Gynecology

## 2022-11-06 ENCOUNTER — Encounter: Payer: Self-pay | Admitting: Radiology

## 2022-11-22 ENCOUNTER — Encounter (HOSPITAL_COMMUNITY): Payer: Self-pay

## 2022-11-22 ENCOUNTER — Emergency Department (HOSPITAL_COMMUNITY): Payer: Medicaid Other

## 2022-11-22 ENCOUNTER — Other Ambulatory Visit: Payer: Self-pay

## 2022-11-22 ENCOUNTER — Emergency Department (HOSPITAL_COMMUNITY)
Admission: EM | Admit: 2022-11-22 | Discharge: 2022-11-22 | Disposition: A | Discharge status: Home / Self Care | Payer: Medicaid Other | Attending: Emergency Medicine | Admitting: Emergency Medicine

## 2022-11-22 DIAGNOSIS — S62364A Nondisplaced fracture of neck of fourth metacarpal bone, right hand, initial encounter for closed fracture: Secondary | ICD-10-CM | POA: Diagnosis not present

## 2022-11-22 DIAGNOSIS — S60221A Contusion of right hand, initial encounter: Secondary | ICD-10-CM | POA: Diagnosis not present

## 2022-11-22 DIAGNOSIS — M79641 Pain in right hand: Secondary | ICD-10-CM | POA: Diagnosis present

## 2022-11-22 DIAGNOSIS — W228XXA Striking against or struck by other objects, initial encounter: Secondary | ICD-10-CM | POA: Insufficient documentation

## 2022-11-22 MED ORDER — IBUPROFEN 800 MG PO TABS
800.0000 mg | ORAL_TABLET | Freq: Once | ORAL | Status: AC
  Administered 2022-11-22: 800 mg via ORAL
  Filled 2022-11-22: qty 1
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
## 2022-11-22 NOTE — ED Notes (Signed)
Xr in room.

## 2022-11-22 NOTE — Discharge Instructions (Signed)
Please see the phone number for the orthopedic doctor above.  I want you to be checked to make sure that your hand is healing.  You do have a broken bone in your hand.  This may require surgery.  In the meantime you may take Tylenol or ibuprofen, keep your hand elevated, use the splint at all times, do not get it wet, ice will help reduce swelling  Also I have given you a list of resources for safe spaces if you feel like you are in danger or feel threatened.  You may always return to the emergency department at any time for any reason especially if you are concerned for your health or safety
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
## 2022-11-25 ENCOUNTER — Encounter: Payer: Self-pay | Admitting: Orthopedic Surgery

## 2022-11-25 ENCOUNTER — Ambulatory Visit (INDEPENDENT_AMBULATORY_CARE_PROVIDER_SITE_OTHER): Payer: Medicaid Other | Admitting: Orthopedic Surgery

## 2022-11-25 ENCOUNTER — Other Ambulatory Visit (INDEPENDENT_AMBULATORY_CARE_PROVIDER_SITE_OTHER): Payer: Medicaid Other

## 2022-11-25 VITALS — BP 115/81 | HR 78 | Ht 62.0 in | Wt 116.0 lb

## 2022-11-25 DIAGNOSIS — S99922A Unspecified injury of left foot, initial encounter: Secondary | ICD-10-CM

## 2022-11-25 DIAGNOSIS — S62324A Displaced fracture of shaft of fourth metacarpal bone, right hand, initial encounter for closed fracture: Secondary | ICD-10-CM | POA: Diagnosis not present

## 2022-11-25 DIAGNOSIS — Z01818 Encounter for other preprocedural examination: Secondary | ICD-10-CM | POA: Diagnosis not present

## 2022-11-26 ENCOUNTER — Encounter: Payer: Self-pay | Admitting: Orthopedic Surgery
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
## 2022-11-28 ENCOUNTER — Other Ambulatory Visit: Payer: Self-pay

## 2022-11-28 DIAGNOSIS — S62324A Displaced fracture of shaft of fourth metacarpal bone, right hand, initial encounter for closed fracture: Secondary | ICD-10-CM

## 2022-12-01 NOTE — Patient Instructions (Signed)
Sandy Carter  12/01/2022     @PREFPERIOPPHARMACY @   Your procedure is scheduled on  12/08/2022.   [facility] to [facility]   Call this number if you have problems the morning of surgery:  [phone]  If you experience any cold or flu symptoms such as cough, fever, chills, shortness of breath, etc. between now and your scheduled surgery, please notify us at the above number.   Remember:  Do not eat after midnight.   You may drink clear liquids until 0620 am on 12/08/2022.     Clear liquids allowed are:                    Water, Juice (No red color; non-citric and without pulp; diabetics please choose diet or no sugar options), Carbonated beverages (diabetics please choose diet or no sugar options), Clear Tea (No creamer, milk, or cream, including half & half and powdered creamer), Black Coffee Only (No creamer, milk or cream, including half & half and powdered creamer), Plain Jell-O Only (No red color; diabetics please choose no sugar options), Clear Sports drink (No red color; diabetics please choose diet or no sugar options), and Plain Popsicles Only (No red color; diabetics please choose no sugar options)       At 0620 am on 12/08/2022 drink your carb drink. You can have nothing else to drink after this.    Take these medicines the morning of surgery with A SIP OF WATER                                     toradol(if needed).     Do not wear jewelry, make-up or nail polish.  Do not wear lotions, powders, or perfumes, or deodorant.  Do not shave 48 hours prior to surgery.  Men may shave face and neck.  Do not bring valuables to the hospital.  [facility] is not responsible for any belongings or valuables.  Contacts, dentures or bridgework may not be worn into surgery.  Leave your suitcase in the car.  After surgery it may be brought to your room.  For patients admitted to the hospital, discharge time will be determined by your treatment team.  Patients  discharged the day of surgery will not be allowed to drive home and must have someone with them for 24 hours.    Special instructions:   DO NOT smoke tobacco or vape for 24 hours before your procedure.  Please read over the following fact sheets that you were given. Pain Booklet, Coughing and Deep Breathing, Surgical Site Infection Prevention, Anesthesia Post-op Instructions, and Care and Recovery After Surgery        [place] Fracture Treated With ORIF, Care After The following information offers guidance on how to care for yourself after your procedure. Your health care provider may also give you more specific instructions. If you have problems or questions, contact your health care provider. What can I expect after the procedure? After the procedure, it is common to have: Pain. Swelling. Follow these instructions at home: Medicines Take over-the-counter and prescription medicines only as told by your health care provider. Ask your health care provider if the medicine prescribed to you: Requires you to avoid driving or using machinery. Can cause constipation. You may need to take these actions to prevent or treat constipation: Drink enough fluid  to keep your urine pale yellow. Take over-the-counter or prescription medicines. Eat foods that are high in fiber, such as beans, whole grains, and fresh fruits and vegetables. Limit foods that are high in fat and processed sugars, such as fried or sweet foods. If you have a splint: Wear the splint as told by your health care provider. Remove it only as told by your health care provider. Check the skin around the splint every day. Tell your health care provider about any concerns. Loosen the splint if your fingers tingle, become numb, or turn cold and blue. Keep the splint clean and dry. If you have a cast: Do not put pressure on any part of the cast until it is fully hardened. This may take several hours. Do not stick anything inside the  cast to scratch your skin. Doing that increases your risk of infection. Check the skin around the cast every day. Tell your health care provider about any concerns. You may put lotion on dry skin around the edges of the cast. Do not put lotion on the skin underneath the cast. Keep the cast clean and dry. Bathing Do not take baths, swim, or use a hot tub until your health care provider approves. Ask your health care provider if you may take showers. You may only be allowed to take sponge baths. If your cast or splint is not waterproof: Do not let it get wet. Cover it with a watertight covering when you take a bath or shower. Keep the bandage (dressing) dry until your health care provider says it can be removed. Incision care  Follow instructions from your health care provider about how to take care of your incision. Make sure you: Wash your hands with soap and water for at least 20 seconds before and after you change your dressing. If soap and water are not available, use hand sanitizer. Change your dressing as told by your health care provider. Leave stitches (sutures), skin glue, or adhesive strips in place. These skin closures may need to stay in place for 2 weeks or longer. If adhesive strip edges start to loosen and curl up, you may trim the loose edges. Do not remove adhesive strips completely unless your health care provider tells you to do that. Check your incision area every day for signs of infection. Check for: Redness, or more swelling or pain. Fluid or blood. Warmth. Pus or a bad smell. Managing pain, stiffness, and swelling  If directed, put ice on the injured area. To do this: If you have a removable splint, remove it as told by your health care provider. Put ice in a plastic bag. Place a towel between your skin and the bag or between your cast and the bag. Leave the ice on for 20 minutes, 2-3 times a day. Remove the ice if your skin turns bright red. This is very important.  If you cannot feel pain, heat, or cold, you have a greater risk of damage to the area. Move your fingers often to reduce stiffness and swelling. Raise (elevate) the injured hand above the level of your heart while you are sitting or lying down. Activity Do not use your injured limb to support any part of your body weight until your health care provider says that you can. Return to your normal activities as told by your health care provider. Ask your health care provider what activities are safe for you. Do exercises as told by your health care provider or physical therapist. General instructions  Ask your health care provider when it is safe to drive if you have a cast or splint on your hand. Do not use any products that contain nicotine or tobacco. These products include cigarettes, chewing tobacco, and vaping devices, such as e-cigarettes. These can delay bone healing. If you need help quitting, ask your health care provider. Keep all follow-up visits. This is important. Contact a health care provider if: Your pain is getting worse. You have redness or more swelling or pain around your incision area. You have fluid or blood coming from your incision or coming from under your splint or cast. Your incision feels warm to the touch. You have pus or a bad smell coming from your incision or from under your splint or cast. You have a fever. Get help right away if: You develop a rash. You have difficulty breathing. The following happens, even after you loosen your splint: The skin or fingernails on your injured hand turn blue or gray. Your injured hand tingles, feels cold, or becomes numb. You develop severe pain under the cast or in your hand. Summary It is common to have pain and swelling after the procedure. Keep your splint or cast clean and dry. Contact your health care provider if you have redness, more swelling or pain, or fluids coming from your incision. Get help right away if your  skin or fingernails turn blue, your hand becomes cold or numb, or you develop severe pain under your cast or in your hand. This information is not intended to replace advice given to you by your health care provider. Make sure you discuss any questions you have with your health care provider. Document Revised: 05/30/2020 Document Reviewed: 05/30/2020 Elsevier Patient Education  [place] Anesthesia, Adult, Care After The following information offers guidance on how to care for yourself after your procedure. Your health care provider may also give you more specific instructions. If you have problems or questions, contact your health care provider. What can I expect after the procedure? After the procedure, it is common for people to: Have pain or discomfort at the IV site. Have nausea or vomiting. Have a sore throat or hoarseness. Have trouble concentrating. Feel cold or chills. Feel weak, sleepy, or tired (fatigue). Have soreness and body aches. These can affect parts of the body that were not involved in surgery. Follow these instructions at home: For the time period you were told by your health care provider:  Rest. Do not participate in activities where you could fall or become injured. Do not drive or use machinery. Do not drink alcohol. Do not take sleeping pills or medicines that cause drowsiness. Do not make important decisions or sign legal documents. Do not take care of children on your own. General instructions Drink enough fluid to keep your urine pale yellow. If you have sleep apnea, surgery and certain medicines can increase your risk for breathing problems. Follow instructions from your health care provider about wearing your sleep device: Anytime you are sleeping, including during daytime naps. While taking prescription pain medicines, sleeping medicines, or medicines that make you drowsy. Return to your normal activities as told by your health care  provider. Ask your health care provider what activities are safe for you. Take over-the-counter and prescription medicines only as told by your health care provider. Do not use any products that contain nicotine or tobacco. These products include cigarettes, chewing tobacco, and vaping devices, such as e-cigarettes. These can delay incision healing after surgery.  If you need help quitting, ask your health care provider. Contact a health care provider if: You have nausea or vomiting that does not get better with medicine. You vomit every time you eat or drink. You have pain that does not get better with medicine. You cannot urinate or have bloody urine. You develop a skin rash. You have a fever. Get help right away if: You have trouble breathing. You have chest pain. You vomit blood. These symptoms may be an emergency. Get help right away. Call 911. Do not wait to see if the symptoms will go away. Do not drive yourself to the hospital. Summary After the procedure, it is common to have a sore throat, hoarseness, nausea, vomiting, or to feel weak, sleepy, or fatigue. For the time period you were told by your health care provider, do not drive or use machinery. Get help right away if you have difficulty breathing, have chest pain, or vomit blood. These symptoms may be an emergency. This information is not intended to replace advice given to you by your health care provider. Make sure you discuss any questions you have with your health care provider. Document Revised: 11/22/2021 Document Reviewed: 11/22/2021 Elsevier Patient Education  [place]. How to Use Chlorhexidine Before Surgery Chlorhexidine gluconate (CHG) is a germ-killing (antiseptic) solution that is used to clean the skin. It can get rid of the bacteria that normally live on the skin and can keep them away for about 24 hours. To clean your skin with CHG, you may be given: A CHG solution to use in the shower or as part of a  sponge bath. A prepackaged cloth that contains CHG. Cleaning your skin with CHG may help lower the risk for infection: While you are staying in the intensive care unit of the hospital. If you have a vascular access, such as a central line, to provide short-term or long-term access to your veins. If you have a catheter to drain urine from your bladder. If you are on a ventilator. A ventilator is a machine that helps you breathe by moving air in and out of your lungs. After surgery. What are the risks? Risks of using CHG include: A skin reaction. Hearing loss, if CHG gets in your ears and you have a perforated eardrum. Eye injury, if CHG gets in your eyes and is not rinsed out. The CHG product catching fire. Make sure that you avoid smoking and flames after applying CHG to your skin. Do not use CHG: If you have a chlorhexidine allergy or have previously reacted to chlorhexidine. On babies younger than 61 months of age. How to use CHG solution Use CHG only as told by your health care provider, and follow the instructions on the label. Use the full amount of CHG as directed. Usually, this is one bottle. During a shower Follow these steps when using CHG solution during a shower (unless your health care provider gives you different instructions): Start the shower. Use your normal soap and shampoo to wash your face and hair. Turn off the shower or move out of the shower stream. Pour the CHG onto a clean washcloth. Do not use any type of brush or rough-edged sponge. Starting at your neck, lather your body down to your toes. Make sure you follow these instructions: If you will be having surgery, pay special attention to the part of your body where you will be having surgery. Scrub this area for at least 1 minute. Do not use CHG on  your head or face. If the solution gets into your ears or eyes, rinse them well with water. Avoid your genital area. Avoid any areas of skin that have broken skin,  cuts, or scrapes. Scrub your back and under your arms. Make sure to wash skin folds. Let the lather sit on your skin for 1-2 minutes or as long as told by your health care provider. Thoroughly rinse your entire body in the shower. Make sure that all body creases and crevices are rinsed well. Dry off with a clean towel. Do not put any substances on your body afterward--such as powder, lotion, or perfume--unless you are told to do so by your health care provider. Only use lotions that are recommended by the manufacturer. Put on clean clothes or pajamas. If it is the night before your surgery, sleep in clean sheets.  During a sponge bath Follow these steps when using CHG solution during a sponge bath (unless your health care provider gives you different instructions): Use your normal soap and shampoo to wash your face and hair. Pour the CHG onto a clean washcloth. Starting at your neck, lather your body down to your toes. Make sure you follow these instructions: If you will be having surgery, pay special attention to the part of your body where you will be having surgery. Scrub this area for at least 1 minute. Do not use CHG on your head or face. If the solution gets into your ears or eyes, rinse them well with water. Avoid your genital area. Avoid any areas of skin that have broken skin, cuts, or scrapes. Scrub your back and under your arms. Make sure to wash skin folds. Let the lather sit on your skin for 1-2 minutes or as long as told by your health care provider. Using a different clean, wet washcloth, thoroughly rinse your entire body. Make sure that all body creases and crevices are rinsed well. Dry off with a clean towel. Do not put any substances on your body afterward--such as powder, lotion, or perfume--unless you are told to do so by your health care provider. Only use lotions that are recommended by the manufacturer. Put on clean clothes or pajamas. If it is the night before your  surgery, sleep in clean sheets. How to use CHG prepackaged cloths Only use CHG cloths as told by your health care provider, and follow the instructions on the label. Use the CHG cloth on clean, dry skin. Do not use the CHG cloth on your head or face unless your health care provider tells you to. When washing with the CHG cloth: Avoid your genital area. Avoid any areas of skin that have broken skin, cuts, or scrapes. Before surgery Follow these steps when using a CHG cloth to clean before surgery (unless your health care provider gives you different instructions): Using the CHG cloth, vigorously scrub the part of your body where you will be having surgery. Scrub using a back-and-forth motion for 3 minutes. The area on your body should be completely wet with CHG when you are done scrubbing. Do not rinse. Discard the cloth and let the area air-dry. Do not put any substances on the area afterward, such as powder, lotion, or perfume. Put on clean clothes or pajamas. If it is the night before your surgery, sleep in clean sheets.  For general bathing Follow these steps when using CHG cloths for general bathing (unless your health care provider gives you different instructions). Use a separate CHG cloth for each area  of your body. Make sure you wash between any folds of skin and between your fingers and toes. Wash your body in the following order, switching to a new cloth after each step: The front of your neck, shoulders, and chest. Both of your arms, under your arms, and your hands. Your stomach and groin area, avoiding the genitals. Your right leg and foot. Your left leg and foot. The back of your neck, your back, and your buttocks. Do not rinse. Discard the cloth and let the area air-dry. Do not put any substances on your body afterward--such as powder, lotion, or perfume--unless you are told to do so by your health care provider. Only use lotions that are recommended by the manufacturer. Put on  clean clothes or pajamas. Contact a health care provider if: Your skin gets irritated after scrubbing. You have questions about using your solution or cloth. You swallow any chlorhexidine. Call your local poison control center (1-(682) 592-5476 in the [place] Get help right away if: Your eyes itch badly, or they become very red or swollen. Your skin itches badly and is red or swollen. Your hearing changes. You have trouble seeing. You have swelling or tingling in your mouth or throat. You have trouble breathing. These symptoms may represent a serious problem that is an emergency. Do not wait to see if the symptoms will go away. Get medical help right away. Call your local emergency services (911 in the [place] Do not drive yourself to the hospital. Summary Chlorhexidine gluconate (CHG) is a germ-killing (antiseptic) solution that is used to clean the skin. Cleaning your skin with CHG may help to lower your risk for infection. You may be given CHG to use for bathing. It may be in a bottle or in a prepackaged cloth to use on your skin. Carefully follow your health care provider's instructions and the instructions on the product label. Do not use CHG if you have a chlorhexidine allergy. Contact your health care provider if your skin gets irritated after scrubbing. This information is not intended to replace advice given to you by your health care provider. Make sure you discuss any questions you have with your health care provider. Document Revised: 12/23/2021 Document Reviewed: 11/05/2020 Elsevier Patient Education  [place].

## 2022-12-03 ENCOUNTER — Other Ambulatory Visit: Payer: Self-pay

## 2022-12-03 ENCOUNTER — Encounter (HOSPITAL_COMMUNITY): Payer: Self-pay

## 2022-12-03 ENCOUNTER — Encounter (HOSPITAL_COMMUNITY)
Admission: RE | Admit: 2022-12-03 | Discharge: 2022-12-03 | Disposition: A | Discharge status: Home / Self Care | Payer: Medicaid Other | Source: Ambulatory Visit | Attending: Orthopedic Surgery | Admitting: Orthopedic Surgery

## 2022-12-03 VITALS — BP 109/76 | HR 99 | Temp 98.3°F | Resp 16 | Ht 62.0 in | Wt 116.0 lb

## 2022-12-03 DIAGNOSIS — Z01818 Encounter for other preprocedural examination: Secondary | ICD-10-CM

## 2022-12-03 DIAGNOSIS — Z01812 Encounter for preprocedural laboratory examination: Secondary | ICD-10-CM | POA: Diagnosis present

## 2022-12-03 HISTORY — DX: Other specified postprocedural states: Z98.890

## 2022-12-03 HISTORY — DX: Other specified postprocedural states: R11.2

## 2022-12-03 LAB — BASIC METABOLIC PANEL
Anion gap: 8 (ref 5–15)
BUN: 15 mg/dL (ref 6–20)
CO2: 24 mmol/L (ref 22–32)
Calcium: 9.4 mg/dL (ref 8.9–10.3)
Chloride: 104 mmol/L (ref 98–111)
Creatinine, Ser: 0.71 mg/dL (ref 0.44–1.00)
GFR, Estimated: >60 mL/min (ref >60)
Glucose, Bld: 110 mg/dL — ABNORMAL HIGH (ref 70–99)
Potassium: 4.1 mmol/L (ref 3.5–5.1)
Sodium: 136 mmol/L (ref 135–145)

## 2022-12-03 LAB — POCT PREGNANCY, URINE: Preg Test, Ur: NEGATIVE

## 2022-12-03 LAB — CBC
HCT: 38.1 % (ref 36.0–46.0)
Hemoglobin: 13 g/dL (ref 12.0–15.0)
MCH: 30.7 pg (ref 26.0–34.0)
MCHC: 34.1 g/dL (ref 30.0–36.0)
MCV: 89.9 fL (ref 80.0–100.0)
Platelets: 344 10*3/uL (ref 150–400)
RBC: 4.24 MIL/uL (ref 3.87–5.11)
RDW: 11.9 % (ref 11.5–15.5)
WBC: 7.7 10*3/uL (ref 4.0–10.5)
nRBC: 0 % (ref 0.0–0.2)

## 2022-12-08 ENCOUNTER — Other Ambulatory Visit: Payer: Self-pay

## 2022-12-08 ENCOUNTER — Ambulatory Visit (HOSPITAL_COMMUNITY): Payer: Medicaid Other

## 2022-12-08 ENCOUNTER — Ambulatory Visit (HOSPITAL_COMMUNITY)
Admission: RE | Admit: 2022-12-08 | Discharge: 2022-12-08 | Disposition: A | Discharge status: Home / Self Care | Payer: Medicaid Other | Source: Ambulatory Visit | Attending: Orthopedic Surgery | Admitting: Orthopedic Surgery

## 2022-12-08 ENCOUNTER — Ambulatory Visit (HOSPITAL_BASED_OUTPATIENT_CLINIC_OR_DEPARTMENT_OTHER): Payer: Medicaid Other | Admitting: Anesthesiology

## 2022-12-08 ENCOUNTER — Encounter (HOSPITAL_COMMUNITY)
Admission: RE | Disposition: A | Discharge status: Home / Self Care | Payer: Self-pay | Source: Ambulatory Visit | Attending: Orthopedic Surgery

## 2022-12-08 ENCOUNTER — Encounter (HOSPITAL_COMMUNITY): Payer: Self-pay | Admitting: Orthopedic Surgery

## 2022-12-08 ENCOUNTER — Ambulatory Visit (HOSPITAL_COMMUNITY): Payer: Medicaid Other | Admitting: Anesthesiology

## 2022-12-08 DIAGNOSIS — Z01818 Encounter for other preprocedural examination: Secondary | ICD-10-CM

## 2022-12-08 DIAGNOSIS — X58XXXA Exposure to other specified factors, initial encounter: Secondary | ICD-10-CM | POA: Diagnosis not present

## 2022-12-08 DIAGNOSIS — F17210 Nicotine dependence, cigarettes, uncomplicated: Secondary | ICD-10-CM | POA: Insufficient documentation

## 2022-12-08 DIAGNOSIS — S62324A Displaced fracture of shaft of fourth metacarpal bone, right hand, initial encounter for closed fracture: Secondary | ICD-10-CM

## 2022-12-08 HISTORY — PX: OPEN REDUCTION INTERNAL FIXATION (ORIF) METACARPAL: SHX6234

## 2022-12-08 SURGERY — OPEN REDUCTION INTERNAL FIXATION (ORIF) METACARPAL
Anesthesia: General | Site: Finger | Laterality: Right

## 2022-12-08 MED ORDER — ORAL CARE MOUTH RINSE: 15.0000 mL | Freq: Once | OROMUCOSAL | Status: DC

## 2022-12-08 MED ORDER — BUPIVACAINE HCL (PF) 0.5 % IJ SOLN
INTRAMUSCULAR | Status: DC | PRN
  Administered 2022-12-08: 10 mL

## 2022-12-08 MED ORDER — ONDANSETRON 4 MG PO TBDP
ORAL_TABLET | ORAL | Status: AC
  Filled 2022-12-08: qty 1

## 2022-12-08 MED ORDER — LACTATED RINGERS IV SOLN: INTRAVENOUS | Status: DC

## 2022-12-08 MED ORDER — ONDANSETRON 4 MG PO TBDP
4.0000 mg | ORAL_TABLET | Freq: Once | ORAL | Status: AC
  Administered 2022-12-08: 4 mg via ORAL

## 2022-12-08 MED ORDER — GLYCOPYRROLATE PF 0.2 MG/ML IJ SOSY
PREFILLED_SYRINGE | INTRAMUSCULAR | Status: DC | PRN
  Administered 2022-12-08: .2 mg via INTRAVENOUS

## 2022-12-08 MED ORDER — DEXMEDETOMIDINE HCL IN NACL 80 MCG/20ML IV SOLN
INTRAVENOUS | Status: AC
  Filled 2022-12-08: qty 20

## 2022-12-08 MED ORDER — SEVOFLURANE IN SOLN
RESPIRATORY_TRACT | Status: AC
  Filled 2022-12-08: qty 250

## 2022-12-08 MED ORDER — OXYCODONE HCL 5 MG PO TABS: 5.0000 mg | ORAL_TABLET | Freq: Once | ORAL | Status: DC | PRN

## 2022-12-08 MED ORDER — MIDAZOLAM HCL 2 MG/2ML IJ SOLN
INTRAMUSCULAR | Status: AC
  Filled 2022-12-08: qty 2

## 2022-12-08 MED ORDER — CEFAZOLIN SODIUM-DEXTROSE 2-4 GM/100ML-% IV SOLN
2.0000 g | INTRAVENOUS | Status: AC
  Administered 2022-12-08: 2 g via INTRAVENOUS

## 2022-12-08 MED ORDER — ONDANSETRON HCL 4 MG PO TABS: 4.0000 mg | ORAL_TABLET | Freq: Three times a day (TID) | ORAL | 0 refills | Status: AC | PRN

## 2022-12-08 MED ORDER — CEFAZOLIN SODIUM-DEXTROSE 2-4 GM/100ML-% IV SOLN
INTRAVENOUS | Status: AC
  Filled 2022-12-08: qty 100

## 2022-12-08 MED ORDER — KETOROLAC TROMETHAMINE 30 MG/ML IJ SOLN
INTRAMUSCULAR | Status: AC
  Filled 2022-12-08: qty 1

## 2022-12-08 MED ORDER — SODIUM CHLORIDE 0.9 % IR SOLN
Status: DC | PRN
  Administered 2022-12-08: 1000 mL

## 2022-12-08 MED ORDER — CHLORHEXIDINE GLUCONATE 0.12 % MT SOLN
OROMUCOSAL | Status: AC
  Filled 2022-12-08: qty 15

## 2022-12-08 MED ORDER — HYDROCODONE-ACETAMINOPHEN 5-325 MG PO TABS: 1.0000 | ORAL_TABLET | Freq: Four times a day (QID) | ORAL | 0 refills | Status: AC | PRN

## 2022-12-08 MED ORDER — KETOROLAC TROMETHAMINE 30 MG/ML IJ SOLN
INTRAMUSCULAR | Status: DC | PRN
  Administered 2022-12-08: 30 mg via INTRAVENOUS

## 2022-12-08 MED ORDER — OXYCODONE HCL 5 MG/5ML PO SOLN: 5.0000 mg | Freq: Once | ORAL | Status: DC | PRN

## 2022-12-08 MED ORDER — PROPOFOL 10 MG/ML IV BOLUS
INTRAVENOUS | Status: AC
  Filled 2022-12-08: qty 20

## 2022-12-08 MED ORDER — FENTANYL CITRATE PF 50 MCG/ML IJ SOSY
25.0000 ug | PREFILLED_SYRINGE | INTRAMUSCULAR | Status: DC | PRN
  Filled 2022-12-08: qty 1

## 2022-12-08 MED ORDER — DEXAMETHASONE SODIUM PHOSPHATE 4 MG/ML IJ SOLN
INTRAMUSCULAR | Status: DC | PRN
  Administered 2022-12-08: 5 mg via INTRAVENOUS

## 2022-12-08 MED ORDER — ONDANSETRON HCL 4 MG/2ML IJ SOLN
INTRAMUSCULAR | Status: AC
  Filled 2022-12-08: qty 2

## 2022-12-08 MED ORDER — LIDOCAINE HCL (PF) 2 % IJ SOLN
INTRAMUSCULAR | Status: AC
  Filled 2022-12-08: qty 5

## 2022-12-08 MED ORDER — LIDOCAINE 2% (20 MG/ML) 5 ML SYRINGE
INTRAMUSCULAR | Status: DC | PRN
  Administered 2022-12-08: 100 mg via INTRAVENOUS

## 2022-12-08 MED ORDER — MIDAZOLAM HCL 5 MG/5ML IJ SOLN
INTRAMUSCULAR | Status: DC | PRN
  Administered 2022-12-08: 2 mg via INTRAVENOUS

## 2022-12-08 MED ORDER — FENTANYL CITRATE (PF) 100 MCG/2ML IJ SOLN
INTRAMUSCULAR | Status: DC | PRN
  Administered 2022-12-08 (×2): 50 ug via INTRAVENOUS

## 2022-12-08 MED ORDER — ONDANSETRON HCL 4 MG/2ML IJ SOLN
INTRAMUSCULAR | Status: DC | PRN
  Administered 2022-12-08: 4 mg via INTRAVENOUS

## 2022-12-08 MED ORDER — ONDANSETRON HCL 4 MG/2ML IJ SOLN: 4.0000 mg | Freq: Once | INTRAMUSCULAR | Status: DC | PRN

## 2022-12-08 MED ORDER — DEXMEDETOMIDINE HCL IN NACL 80 MCG/20ML IV SOLN
INTRAVENOUS | Status: DC | PRN
  Administered 2022-12-08 (×2): 8 ug via BUCCAL

## 2022-12-08 MED ORDER — PROPOFOL 10 MG/ML IV BOLUS
INTRAVENOUS | Status: DC | PRN
  Administered 2022-12-08: 50 mg via INTRAVENOUS
  Administered 2022-12-08: 200 mg via INTRAVENOUS

## 2022-12-08 MED ORDER — CHLORHEXIDINE GLUCONATE 0.12 % MT SOLN: 15.0000 mL | Freq: Once | OROMUCOSAL | Status: DC

## 2022-12-08 MED ORDER — GLYCOPYRROLATE PF 0.2 MG/ML IJ SOSY
PREFILLED_SYRINGE | INTRAMUSCULAR | Status: AC
  Filled 2022-12-08: qty 1

## 2022-12-08 MED ORDER — FENTANYL CITRATE (PF) 100 MCG/2ML IJ SOLN
INTRAMUSCULAR | Status: AC
  Filled 2022-12-08: qty 2

## 2022-12-08 MED ORDER — BUPIVACAINE HCL (PF) 0.5 % IJ SOLN
INTRAMUSCULAR | Status: AC
  Filled 2022-12-08: qty 30

## 2022-12-08 SURGICAL SUPPLY — 46 items
APL PRP STRL LF DISP 70% ISPRP (MISCELLANEOUS) ×1
BANDAGE ESMARK 4X12 BL STRL LF (DISPOSABLE) ×1 IMPLANT
BIT DRILL 3 CANN STRGHT (BIT) ×1
BLADE SURG 15 STRL LF DISP TIS (BLADE) IMPLANT
BLADE SURG 15 STRL SS (BLADE) ×1
BNDG CMPR 12X4 ELC STRL LF (DISPOSABLE) ×1
BNDG CMPR STD VLCR NS LF 5.8X2 (GAUZE/BANDAGES/DRESSINGS) ×1
BNDG COHESIVE 2X5 TAN STRL LF (GAUZE/BANDAGES/DRESSINGS) ×1 IMPLANT
BNDG ELASTIC 2X5.8 VLCR NS LF (GAUZE/BANDAGES/DRESSINGS) ×1 IMPLANT
BNDG ESMARK 4X12 BLUE STRL LF (DISPOSABLE) ×1
CHLORAPREP W/TINT 26 (MISCELLANEOUS) ×1 IMPLANT
CLOTH BEACON ORANGE TIMEOUT ST (SAFETY) ×1 IMPLANT
CORD BIPOLAR FORCEPS 12FT (ELECTRODE) ×1 IMPLANT
COVER LIGHT HANDLE STERIS (MISCELLANEOUS) ×2 IMPLANT
COVER MAYO STAND XLG (MISCELLANEOUS) IMPLANT
CUFF TOURN SGL QUICK 18X4 (TOURNIQUET CUFF) ×1 IMPLANT
DRAPE C-ARM FOLDED MOBILE STRL (DRAPES) ×1 IMPLANT
DRAPE HALF SHEET 40X57 (DRAPES) IMPLANT
DRILL COUNTERSINK CANN 3 (BIT) IMPLANT
GAUZE SPONGE 4X4 12PLY STRL (GAUZE/BANDAGES/DRESSINGS) ×1 IMPLANT
GLOVE BIO SURGEON STRL SZ8 (GLOVE) ×3 IMPLANT
GLOVE BIOGEL PI IND STRL 7.0 (GLOVE) ×2 IMPLANT
GLOVE SRG 8 PF TXTR STRL LF DI (GLOVE) ×1 IMPLANT
GLOVE SURG UNDER POLY LF SZ8 (GLOVE) ×1
GOWN STRL REUS W/ TWL XL LVL3 (GOWN DISPOSABLE) ×1 IMPLANT
GOWN STRL REUS W/TWL LRG LVL3 (GOWN DISPOSABLE) ×2 IMPLANT
GOWN STRL REUS W/TWL XL LVL3 (GOWN DISPOSABLE) ×1
GUIDEWIRE 0.86MM (WIRE) IMPLANT
KIT TURNOVER KIT A (KITS) ×1 IMPLANT
MANIFOLD NEPTUNE II (INSTRUMENTS) ×1 IMPLANT
NDL HYPO 21X1.5 SAFETY (NEEDLE) ×1 IMPLANT
NEEDLE HYPO 21X1.5 SAFETY (NEEDLE) ×1 IMPLANT
NS IRRIG 1000ML POUR BTL (IV SOLUTION) ×1 IMPLANT
PACK BASIC LIMB (CUSTOM PROCEDURE TRAY) ×1 IMPLANT
PAD CAST 4YDX4 CTTN HI CHSV (CAST SUPPLIES) IMPLANT
PADDING CAST COTTON 4X4 STRL (CAST SUPPLIES) ×1
SCREW CANN COMP FT 2.5X44 (Screw) IMPLANT
SET BASIN LINEN APH (SET/KITS/TRAYS/PACK) ×1 IMPLANT
SPLINT PLASTER CAST XFAST 5X30 (CAST SUPPLIES) IMPLANT
STRIP CLOSURE SKIN 1/2X4 (GAUZE/BANDAGES/DRESSINGS) IMPLANT
SUT MNCRL AB 4-0 PS2 18 (SUTURE) IMPLANT
SUT PROLENE 3 0 PS 2 (SUTURE) ×1 IMPLANT
SUT VIC AB 2-0 CT2 27 (SUTURE) IMPLANT
SYR BULB IRRIG 60ML STRL (SYRINGE) ×1 IMPLANT
SYR CONTROL 10ML LL (SYRINGE) ×1 IMPLANT
WATER STERILE IRR 500ML POUR (IV SOLUTION) IMPLANT

## 2022-12-08 NOTE — Anesthesia Procedure Notes (Signed)
Procedure Name: LMA Insertion Date/Time: 12/08/2022 9:34 AM  Performed by: Orlie Dakin, CRNAPre-anesthesia Checklist: Patient identified, Emergency Drugs available, Suction available and Patient being monitored Patient Re-evaluated:Patient Re-evaluated prior to induction Oxygen Delivery Method: Circle system utilized Preoxygenation: Pre-oxygenation with 100% oxygen Induction Type: IV induction Ventilation: Mask ventilation without difficulty LMA: LMA with gastric port inserted LMA Size: 4.0 Tube type: Oral Number of attempts: 1 Placement Confirmation: positive ETCO2 Tube secured with: Tape Dental Injury: Teeth and Oropharynx as per pre-operative assessment

## 2022-12-08 NOTE — Transfer of Care (Signed)
Immediate Anesthesia Transfer of Care Note  Patient: Sandy Carter  Procedure(s) Performed: OPEN REDUCTION INTERNAL FIXATION (ORIF) METACARPAL (Right: Finger)  Patient Location: PACU  Anesthesia Type:General  Level of Consciousness: drowsy  Airway & Oxygen Therapy: Patient Spontanous Breathing and Patient connected to face mask oxygen  Post-op Assessment: Report given to RN and Post -op Vital signs reviewed and stable  Post vital signs: Reviewed and stable  Last Vitals:  Vitals Value Taken Time  BP    Temp    Pulse    Resp    SpO2      Last Pain:  Vitals:   12/08/22 0840  TempSrc: Oral  PainSc: 0-No pain      Patients Stated Pain Goal: 7 ([id number])  Complications: No notable events documented.

## 2022-12-08 NOTE — Op Note (Signed)
Orthopaedic Surgery Operative Note (CSN: [id number])  Sandy Carter  2000/12/16 Date of Surgery: 12/08/2022   Diagnoses:  Right 4th metacarpal shaft fracture  Procedure: Operative fixation of right fourth metacarpal shaft fracture, with an intramedullary screw   Operative Finding Successful completion of the planned procedure.  Intramedullary screw was placed in retrograde fashion across the fracture site.  No complications.   Post-Op Diagnosis: Same Surgeons:Primary: Mordecai Rasmussen, MD Assistants: Franklyn Lor Location: [facility] OR ROOM 4 Anesthesia: General with local anesthesia Antibiotics: Ancef 2 g Tourniquet time:  Total Tourniquet Time Documented: Upper Arm (Right) - 70 minutes Total: Upper Arm (Right) - 70 minutes  Estimated Blood Loss: 10 cc Complications: None Specimens: None Implants: Implant Name Type Inv. Item Serial No. Manufacturer Lot No. LRB No. Used Action  SCREW CANN COMP FT [id number] - [id number] Screw SCREW CANN COMP FT [id number]  [employer] STERILE ON SET Right 1 Implanted    Indications for Surgery:   Sandy Carter is a 22 y.o. female who sustained a displaced right fourth metacarpal shaft fracture.  Given the angulation, and the location of the injury, I recommended operative fixation.  Benefits and risks of operative and nonoperative management were discussed prior to surgery with the patient and informed consent form was completed.  Specific risks including infection, need for additional surgery, damage to surrounding structures, bleeding, nonunion, malunion, stiffness, persistent pain and more severe complications associated with anesthesia.  All questions were answered.  She elects to proceed.  Surgical consent was finalized.   Procedure:   The patient was identified properly. Informed consent was obtained and the surgical site was marked. The patient was taken to the OR where general anesthesia was induced.  The patient was positioned supine, with her hand on an  extremity table.  The right arm was prepped and draped in the usual sterile fashion.  Timeout was performed before the beginning of the case.  Tourniquet was used for the above duration.  She received 2 g of Ancef prior to making incision.  We made a dorsal incision directly over the fourth MCP joint.  This was confirmed under fluoroscopy.  We dissected through skin.  We then bluntly dissected through the subcutaneous tissue.  We protected the neurovascular bundle on the ulnar side of the finger.  The radial neurovascular bundle was not encountered.  We continued our blunt dissection until we were able to visualize the extensor mechanism.  We made a long contusional split in the extensor mechanism, in line with the fibers.  This was subsequently retracted, and were able to visualize the dorsal capsule.  This was incised and cleared off.  We able to visualize the MCP joint.  The cartilage for the distal aspect of the fourth metacarpal was subsequently visualized.  The cartilage was pristine.  The MCP joint was subsequently flexed.  This exposed the entire distal fourth metacarpal head.  A K wire was introduced in retrograde fashion in the dorsal one third of the metacarpal head.  We where able to reduce the fracture.    With the assistance of fluoroscopy, the K wire was passed across the fracture site, into the proximal extent of the fourth metacarpal.  We confirmed our reduction, as well as placement of the K wire with orthogonal views on fluoroscopy.  We then carefully measured, and selected our compression screw from the back table.  We then introduced a 2.5 mm headless compression screw in retrograde fashion across the fracture site.  We made  sure that the head of the screw was countersunk at least 2-3 mm below the level of the cartilage.  We confirmed placement the screw, as well as maintenance of reduction with orthogonal views under fluoroscopy.  We then carefully evaluated the cascade of the fingers, to  ensure that there is no rotational deformity.  Once were satisfied with the position of the screw, as well as the reduction of the fracture, we took final fluoroscopic images.  We then proceeded to irrigate the wound.  The extensor mechanism was closed with 2-0 Vicryl.  The skin was then closed with 4-0 Monocryl.  Local anesthetic was injected around the incision.  Sterile dressing was placed followed by a well-padded ulnar gutter splint.  The patient was awoken taken to PACU in stable condition.   Post-operative plan:  The patient will be none weightbearing on the operative extremity Discharge home from the PACU once they have recovered DVT prophylaxis not indicated in this ambulatory upper extremity patient without significant risk factors.    Pain control with PRN pain medication preferring oral medicines.   Follow up plan will be scheduled in approximately 7-10 days for incision check and XR.

## 2022-12-08 NOTE — Interval H&P Note (Signed)
History and Physical Interval Note:  12/08/2022 9:16 AM  Sandy Carter  has presented today for surgery, with the diagnosis of Right 4th metacarpal shaft fracture.  The various methods of treatment have been discussed with the patient and family. After consideration of risks, benefits and other options for treatment, the patient has consented to  Procedure(s): OPEN REDUCTION INTERNAL FIXATION (ORIF) METACARPAL (Right) as a surgical intervention.  The patient's history has been reviewed, patient examined, no change in status, stable for surgery.  I have reviewed the patient's chart and labs.  Questions were answered to the patient's satisfaction.     Mordecai Rasmussen

## 2022-12-08 NOTE — Anesthesia Preprocedure Evaluation (Signed)
Anesthesia Evaluation  Patient identified by MRN, date of birth, ID band Patient awake    Reviewed: Allergy & Precautions, H&P , NPO status , Patient's Chart, lab work & pertinent test results, reviewed documented beta blocker date and time   History of Anesthesia Complications (+) PONV and history of anesthetic complications  Airway Mallampati: II  TM Distance: >3 FB Neck ROM: full    Dental no notable dental hx.    Pulmonary neg pulmonary ROS, Current Smoker   Pulmonary exam normal breath sounds clear to auscultation       Cardiovascular Exercise Tolerance: Good negative cardio ROS + dysrhythmias  Rhythm:regular Rate:Normal     Neuro/Psych  Headaches, Seizures -,  PSYCHIATRIC DISORDERS Anxiety Depression    negative neurological ROS  negative psych ROS   GI/Hepatic negative GI ROS, Neg liver ROS,,,  Endo/Other  negative endocrine ROS Hyperthyroidism   Renal/GU negative Renal ROS  negative genitourinary   Musculoskeletal   Abdominal   Peds  Hematology negative hematology ROS (+)   Anesthesia Other Findings   Reproductive/Obstetrics negative OB ROS                             Anesthesia Physical Anesthesia Plan  ASA: 2  Anesthesia Plan: General and General LMA   Post-op Pain Management:    Induction:   PONV Risk Score and Plan: Ondansetron  Airway Management Planned:   Additional Equipment:   Intra-op Plan:   Post-operative Plan:   Informed Consent: I have reviewed the patients History and Physical, chart, labs and discussed the procedure including the risks, benefits and alternatives for the proposed anesthesia with the patient or authorized representative who has indicated his/her understanding and acceptance.     Dental Advisory Given  Plan Discussed with: CRNA  Anesthesia Plan Comments:        Anesthesia Quick Evaluation

## 2022-12-08 NOTE — Discharge Instructions (Signed)
Michelyn Scullin A. Amedeo Kinsman, MD [facility] [address] [place],  [place]  [address] Phone: [phone] Fax: [phone]   [place] Please keep splint clean dry and intact until followup.  You may shower on Post-Op Day #2.  You must keep splint dry during this process and may find that a plastic bag taped around the arm or alternatively a towel based bath may be a better option.   If you get your splint wet or if it is damaged please contact our clinic.  EXERCISES Due to your splint being in place you will not be able to bear weight through your extremity.   DO NOT PUT ANY WEIGHT ON YOUR OPERATIVE ARM   REGIONAL ANESTHESIA (NERVE BLOCKS) The anesthesia team may have performed a nerve block for you if safe in the setting of your care.  This is a great tool used to minimize pain.  Typically the block may start wearing off overnight but the long acting medicine may last for 3-4 days.  The nerve block wearing off can be a challenging period but please utilize your as needed pain medications to try and manage this period.    POST-OP MEDICATIONS- Multimodal approach to pain control  In general your pain will be controlled with a combination of substances.  Prescriptions unless otherwise discussed are electronically sent to your pharmacy.  This is a carefully made plan we use to minimize narcotic use.     - Ibuprofen - Anti-inflammatory medication taken as needed  - Acetaminophen - Non-narcotic pain medicine taken as needed   - Hydrocodone - This is a strong narcotic, to be used only on an "as needed" basis for pain.             -          Zofran - take as needed for nausea   FOLLOW-UP If you develop a Fever (>101.5), Redness or Drainage from the surgical incision site, please call our office to arrange for an evaluation. Please call the office to schedule a follow-up appointment for your incision check if you do not  already have one, 10-14 days post-operatively.  IF YOU HAVE ANY QUESTIONS, PLEASE FEEL FREE TO CALL OUR OFFICE.  HELPFUL INFORMATION  If you had a block, it will wear off between 8-24 hrs postop typically.  This is period when your pain may go from nearly zero to the pain you would have had postop without the block.  This is an abrupt transition but nothing dangerous is happening.  You may take an extra dose of narcotic when this happens.  You should wean off your narcotic medicines as soon as you are able.  Most patients will be off or using minimal narcotics before their first postop appointment.   Elevating your leg will help with swelling and pain control.  You are encouraged to elevate your leg as much as possible in the first couple of weeks following surgery.  Imagine a drop of water on your toe, and your goal is to get that water back to your heart.  We suggest you use the pain medication the first night prior to going to bed, in order to ease any pain when the anesthesia wears off. You should avoid taking pain medications on an empty stomach as it will make you nauseous.  Do not drink alcoholic beverages or take illicit drugs when taking pain medications.  In most states it  is against the law to drive while you are in a splint or sling.  And certainly against the law to drive while taking narcotics.  You may return to work/school in the next couple of days when you feel up to it.   Pain medication may make you constipated.  Below are a few solutions to try in this order: Decrease the amount of pain medication if you aren't having pain. Drink lots of decaffeinated fluids. Drink prune juice and/or each dried prunes  If the first 3 don't work start with additional solutions Take Colace - an over-the-counter stool softener Take Senokot - an over-the-counter laxative Take Miralax - a stronger over-the-counter laxative

## 2022-12-11 NOTE — Anesthesia Postprocedure Evaluation (Signed)
Anesthesia Post Note  Patient: Sandy Carter  Procedure(s) Performed: OPEN REDUCTION INTERNAL FIXATION (ORIF) METACARPAL (Right: Finger)  Patient location during evaluation: Phase II Anesthesia Type: General Level of consciousness: awake Pain management: pain level controlled Vital Signs Assessment: post-procedure vital signs reviewed and stable Respiratory status: spontaneous breathing and respiratory function stable Cardiovascular status: blood pressure returned to baseline and stable Postop Assessment: no headache and no apparent nausea or vomiting Anesthetic complications: no Comments: Late entry   No notable events documented.   Last Vitals:  Vitals:   12/08/22 1200 12/08/22 1209  BP: 116/69 116/69  Pulse: 91 91  Resp: 12 12  Temp:  (!) 36.4 C  SpO2: 100% 100%    Last Pain:  Vitals:   12/09/22 1011  TempSrc:   PainSc: [place]

## 2022-12-15 ENCOUNTER — Encounter (HOSPITAL_COMMUNITY): Payer: Self-pay | Admitting: Orthopedic Surgery

## 2022-12-22 ENCOUNTER — Telehealth: Payer: Self-pay | Admitting: Orthopedic Surgery

## 2022-12-22 NOTE — Telephone Encounter (Signed)
Dr. Dallas Schimke pt - spoke w/this patient, she had surgery on her right hand on 12/08/22, she should've had a postop appointment last week.  The patient stated that she is in [place] w/her spouse for his work.  She doesn't think she'll be back any sooner than two weeks and not sure of that.  She stated that Dr. Dallas Schimke told her before he did her surgery that she could follow up with someone there, but she stated she doesn't have insurance there so she can't.  She said they have a lot of bills.  She is requesting a phone/virtual visit, but she is supposed to have an incision check w/an xray.  Please advise.

## 2022-12-23 NOTE — Telephone Encounter (Signed)
Scheduled

## 2022-12-24 ENCOUNTER — Encounter: Payer: Self-pay | Admitting: Orthopedic Surgery

## 2022-12-24 ENCOUNTER — Ambulatory Visit (INDEPENDENT_AMBULATORY_CARE_PROVIDER_SITE_OTHER): Payer: Medicaid Other | Admitting: Orthopedic Surgery

## 2022-12-24 DIAGNOSIS — S62324D Displaced fracture of shaft of fourth metacarpal bone, right hand, subsequent encounter for fracture with routine healing: Secondary | ICD-10-CM

## 2022-12-24 NOTE — Progress Notes (Signed)
Orthopaedic Clinic Return - Virtual Telephone Visit   **Visit was conducted via telephone at the patient's request.  No vital signs or physical exam were completed.  All previous medical records were readily available during my conversation with the patient.  In total, I was on the phone with the patient for 15 minutes**   Location: Patient: Home Provider: Clinic   Assessment: Sandy Carter is a 22 y.o. female with the following: Status postoperative fixation of right metacarpal shaft fracture  DOS: December 08, 2022  Plan: Procedure was discussed in detail.  Recommendations regarding incision care and activity were discussed.  Okay to work on gentle range of motion.  Consider a removable brace to provide some stability.  She was able to remove the splint while we discussed her treatment on the phone.  No immediate concerns.  She will send [place] pictures if she has any issues with the incision or the appearance of her hand.  Suture should be trimmed.  Band-Aid as needed.  She states she may be returning to the [place] area within the next couple weeks, and have asked her to return to clinic when she does.  She states her understanding.   Follow-up: Return if symptoms worsen or fail to improve.   Subjective:  Chief Complaint  Patient presents with   Postop     R hand surgery - DOS 12/08/2022    History of Present Illness: I connected with  Sandy Carter on 12/24/22 via telephone and verified that I am speaking with the correct person using two identifiers.   I discussed the limitations of evaluation and management by telemedicine. The patient expressed understanding and agreed to proceed.   Sandy Carter is a 22 y.o. female who presents 2 weeks following operative fixation of the right fourth metacarpal shaft fracture.  She did state immediately following the surgery.  She has done well.  She reports that the pain is improving.  She notes some motion in her splint.  She has not  remove the splint.  She has not been evaluated by anybody else.  She is no longer taking pain medications.  She states that she might be returning to [place] in the next 1-2 weeks.  Review of Systems: No fevers or chills No numbness or tingling No chest pain No shortness of breath No bowel or bladder dysfunction No GI distress No headaches   Objective: No vital signs  Physical Exam:  Telephone consult - No exam   IMAGING: I personally ordered and reviewed the following images:  No new imaging today  Oliver Barre, MD 12/24/2022 4:15 PM

## 2022-12-29 ENCOUNTER — Encounter: Payer: Self-pay | Admitting: Orthopedic Surgery

## 2022-12-31 ENCOUNTER — Ambulatory Visit (INDEPENDENT_AMBULATORY_CARE_PROVIDER_SITE_OTHER): Payer: Medicaid Other | Admitting: Orthopedic Surgery

## 2022-12-31 ENCOUNTER — Encounter: Payer: Self-pay | Admitting: Orthopedic Surgery

## 2022-12-31 ENCOUNTER — Other Ambulatory Visit (INDEPENDENT_AMBULATORY_CARE_PROVIDER_SITE_OTHER): Payer: Medicaid Other

## 2022-12-31 DIAGNOSIS — S62324D Displaced fracture of shaft of fourth metacarpal bone, right hand, subsequent encounter for fracture with routine healing: Secondary | ICD-10-CM

## 2022-12-31 NOTE — Progress Notes (Signed)
Orthopaedic Postop Note  Assessment: Sandy Carter is a 22 y.o. female s/p intramedullary screw placement for a right fourth metacarpal shaft fracture  DOS: 12/08/2022  Plan: Sutures trimmed, Steri-Strips placed. No brace or splint Okay for active range of motion, but limit lifting and power grip. Continue to passively extend the ring finger.  As swelling improves, anticipate that ability to extend the finger will continue to improve.  Medications as needed. Contact the clinic if having issues, we can set up a phone appointment.  If returns to [place], we can set up an appointment for further evaluation.  Follow-up: Return if symptoms worsen or fail to improve. XR at next visit: Right hand  Subjective:  Chief Complaint  Patient presents with   Routine Post Op    ORIF RT fourth metacarpal DOS 12/08/22    History of Present Illness: Sandy Carter is a 22 y.o. female who presents following the above stated procedure.  Surgery was approximately 3 weeks ago.  We have previously had a telephone visit.  She has been out of states that surgery.  She is denying pain.  Since removal of the splint, she has been working on range of motion.  She has achieved excellent flexion.  She is having some difficulty working on extension.  No injuries following surgery.  She has returned to [place] today, but is returning to [place] later on tonight.  Unclear of when she will be returning to the area.  Review of Systems: No fevers or chills No numbness or tingling No Chest Pain No shortness of breath   Objective: There were no vitals taken for this visit.  Physical Exam:  Alert and oriented.  No acute distress.  Healing dorsal based incision just ulnar to the fourth MCP, on the dorsal hand.  No surrounding erythema or drainage.  There is residual swelling in the area.  Sensation throughout the ring finger is intact.  She is able to achieve full flexion of all fingers.  She is having  difficulty with extension of the MCP joint due to pain.  Passively she can achieve full extension.  Fingers are warm and well-perfused.  IMAGING: I personally ordered and reviewed the following images:   X-rays of the right hand were obtained in clinic today.  No acute injuries are noted.  Well-positioned intramedullary screw in the fourth metacarpal.  Fracture remains reduced.  There has been no interval displacement.  No bony lesions.  No dislocations.  Impression: Healing right fourth metacarpal fracture without hardware failure  Oliver Barre, MD 12/31/2022 3:43 PM

## 2022-12-31 NOTE — Patient Instructions (Addendum)
Please call if you have any issues  When you return to [place], please contact to schedule follow-up appointment.  Continue to work on range of motion.  Continue to stretch and extend the ring finger.

## 2023-02-12 ENCOUNTER — Encounter: Payer: Self-pay | Admitting: Obstetrics & Gynecology

## 2023-02-24 ENCOUNTER — Other Ambulatory Visit: Payer: Self-pay | Admitting: Adult Health

## 2023-02-24 ENCOUNTER — Ambulatory Visit (INDEPENDENT_AMBULATORY_CARE_PROVIDER_SITE_OTHER): Payer: Medicaid Other | Admitting: *Deleted

## 2023-02-24 VITALS — BP 113/69 | HR 90 | Ht 62.0 in | Wt 114.0 lb

## 2023-02-24 DIAGNOSIS — Z3201 Encounter for pregnancy test, result positive: Secondary | ICD-10-CM | POA: Diagnosis not present

## 2023-02-24 DIAGNOSIS — Z789 Other specified health status: Secondary | ICD-10-CM

## 2023-02-24 DIAGNOSIS — N926 Irregular menstruation, unspecified: Secondary | ICD-10-CM

## 2023-02-24 LAB — POCT URINE PREGNANCY: Preg Test, Ur: POSITIVE — AB

## 2023-02-24 NOTE — Progress Notes (Signed)
   NURSE VISIT- PREGNANCY CONFIRMATION   SUBJECTIVE:  Sandy Carter is a 22 y.o. G18P1011 female at by uncertain LMP of No LMP recorded (lmp unknown). Patient is pregnant. Here for pregnancy confirmation.  Home pregnancy test: positive x 3   Had 3 days of spotting 4/5 and 5/4. She reports nausea and vomiting.  She is taking prenatal vitamins.    OBJECTIVE:  BP 113/69 (BP Location: Right Arm, Patient Position: Sitting, Cuff Size: Normal)   Pulse 90   Ht 5\' 2"  (1.575 m)   Wt 114 lb (51.7 kg)   LMP  (LMP Unknown)   Breastfeeding No   BMI 20.85 kg/m   Appears well, in no apparent distress  Results for orders placed or performed in visit on 02/24/23 (from the past 24 hour(s))  POCT urine pregnancy   Collection Time: 02/24/23  3:11 PM  Result Value Ref Range   Preg Test, Ur Positive (A) Negative    ASSESSMENT: Positive pregnancy test, Unknown by LMP    PLAN: Schedule for dating ultrasound in  Prenatal vitamins: continue   Nausea medicines: not currently needed   OB packet given: Yes  Jobe Marker  02/24/2023 3:11 PM

## 2023-02-25 LAB — BETA HCG QUANT (REF LAB): hCG Quant: 43381 m[IU]/mL

## 2023-03-11 ENCOUNTER — Encounter: Payer: Self-pay | Admitting: Obstetrics & Gynecology

## 2023-03-17 ENCOUNTER — Other Ambulatory Visit: Payer: MEDICAID

## 2023-03-19 ENCOUNTER — Other Ambulatory Visit: Payer: Self-pay | Admitting: Obstetrics & Gynecology

## 2023-03-19 ENCOUNTER — Ambulatory Visit: Payer: MEDICAID

## 2023-03-19 DIAGNOSIS — O3680X0 Pregnancy with inconclusive fetal viability, not applicable or unspecified: Secondary | ICD-10-CM

## 2023-03-19 DIAGNOSIS — Z3A09 9 weeks gestation of pregnancy: Secondary | ICD-10-CM

## 2023-03-19 DIAGNOSIS — Z3481 Encounter for supervision of other normal pregnancy, first trimester: Secondary | ICD-10-CM | POA: Diagnosis not present

## 2023-03-19 NOTE — Progress Notes (Signed)
[place] 9+6 wks,single IUP with yolk sac,CRL 30.02 mm,normal ovaries,FHR 164 BPM

## 2023-03-23 ENCOUNTER — Other Ambulatory Visit: Payer: MEDICAID

## 2023-04-08 ENCOUNTER — Encounter: Payer: Self-pay | Admitting: Obstetrics & Gynecology

## 2023-04-11 ENCOUNTER — Encounter: Payer: Self-pay | Admitting: Obstetrics & Gynecology

## 2023-04-13 ENCOUNTER — Other Ambulatory Visit: Payer: Self-pay | Admitting: Obstetrics & Gynecology

## 2023-04-13 DIAGNOSIS — Z3682 Encounter for antenatal screening for nuchal translucency: Secondary | ICD-10-CM

## 2023-04-14 ENCOUNTER — Ambulatory Visit: Payer: MEDICAID

## 2023-04-14 ENCOUNTER — Encounter: Payer: MEDICAID | Admitting: *Deleted

## 2023-04-14 ENCOUNTER — Encounter: Payer: Self-pay | Admitting: Women's Health

## 2023-04-14 ENCOUNTER — Ambulatory Visit (INDEPENDENT_AMBULATORY_CARE_PROVIDER_SITE_OTHER): Payer: MEDICAID | Admitting: Women's Health

## 2023-04-14 VITALS — BP 113/72 | HR 98 | Wt 114.0 lb

## 2023-04-14 DIAGNOSIS — Z3A13 13 weeks gestation of pregnancy: Secondary | ICD-10-CM

## 2023-04-14 DIAGNOSIS — F418 Other specified anxiety disorders: Secondary | ICD-10-CM | POA: Insufficient documentation

## 2023-04-14 DIAGNOSIS — F1290 Cannabis use, unspecified, uncomplicated: Secondary | ICD-10-CM

## 2023-04-14 DIAGNOSIS — Z3682 Encounter for antenatal screening for nuchal translucency: Secondary | ICD-10-CM | POA: Diagnosis not present

## 2023-04-14 DIAGNOSIS — Z3481 Encounter for supervision of other normal pregnancy, first trimester: Secondary | ICD-10-CM

## 2023-04-14 DIAGNOSIS — Z3480 Encounter for supervision of other normal pregnancy, unspecified trimester: Secondary | ICD-10-CM

## 2023-04-14 DIAGNOSIS — E0590 Thyrotoxicosis, unspecified without thyrotoxic crisis or storm: Secondary | ICD-10-CM | POA: Diagnosis not present

## 2023-04-14 DIAGNOSIS — Z3490 Encounter for supervision of normal pregnancy, unspecified, unspecified trimester: Secondary | ICD-10-CM | POA: Insufficient documentation

## 2023-04-14 DIAGNOSIS — G40909 Epilepsy, unspecified, not intractable, without status epilepticus: Secondary | ICD-10-CM

## 2023-04-14 DIAGNOSIS — R8271 Bacteriuria: Secondary | ICD-10-CM

## 2023-04-14 DIAGNOSIS — O99891 Other specified diseases and conditions complicating pregnancy: Secondary | ICD-10-CM

## 2023-04-14 DIAGNOSIS — O0990 Supervision of high risk pregnancy, unspecified, unspecified trimester: Secondary | ICD-10-CM | POA: Insufficient documentation

## 2023-04-14 NOTE — Progress Notes (Signed)
[place] 13+4 wks,measurements c/w dates,posterior fundal placenta,normal ovaries,NB present,NT 1.5 mm,FHR 160 bpm,CRL 77.83 mm

## 2023-04-14 NOTE — Patient Instructions (Addendum)
Sandy Carter, thank you for choosing our office today! We appreciate the opportunity to meet your healthcare needs. You may receive a short survey by mail, e-mail, or through [employer]. If you are happy with your care we would appreciate if you could take just a few minutes to complete the survey questions. We read all of your comments and take your feedback very seriously. Thank you again for choosing our office.  [facility] [employer] Team at [facility]  [facility] at [facility] ([address] [place], [place] [address]) Entrance C, located off of [address] [employer] [address] valet parking   [email or web address] [address], [place], [place] [address]  22 mi [phone]  Nausea & Vomiting Have saltine crackers or pretzels by your bed and eat a few bites before you raise your head out of bed in the morning Eat small frequent meals throughout the day instead of large meals Drink plenty of fluids throughout the day to stay hydrated, just don't drink a lot of fluids with your meals.  This can make your stomach fill up faster making you feel sick Do not brush your teeth right after you eat Products with real ginger are good for nausea, like ginger ale and ginger hard candy Make sure it says made with real ginger! Sucking on sour candy like lemon heads is also good for nausea If your prenatal vitamins make you nauseated, take them at night so you will sleep through the nausea Sea Bands If you feel like you need medicine for the nausea & vomiting please let us know If you are unable to keep any fluids or food down please let us know   Constipation Drink plenty of fluid, preferably water, throughout the day Eat foods high in fiber such as fruits, vegetables, and grains Exercise, such as walking, is a good way to keep your bowels regular Drink warm fluids, especially warm prune juice, or decaf coffee Eat a 1/2 cup of real oatmeal (not instant), 1/2 cup  applesauce, and 1/2-1 cup warm prune juice every day If needed, you may take Colace (docusate sodium) stool softener once or twice a day to help keep the stool soft.  If you still are having problems with constipation, you may take Miralax once daily as needed to help keep your bowels regular.   Home Blood Pressure Monitoring for Patients   Your provider has recommended that you check your blood pressure (BP) at least once a week at home. If you do not have a blood pressure cuff at home, one will be provided for you. Contact your provider if you have not received your monitor within 1 week.   Helpful Tips for Accurate Home Blood Pressure Checks  Don't smoke, exercise, or drink caffeine 30 minutes before checking your BP Use the restroom before checking your BP (a full bladder can raise your pressure) Relax in a comfortable upright chair Feet on the ground Left arm resting comfortably on a flat surface at the level of your heart Legs uncrossed Back supported Sit quietly and don't talk Place the cuff on your bare arm Adjust snuggly, so that only two fingertips can fit between your skin and the top of the cuff Check 2 readings separated by at least one minute Keep a log of your BP readings For a visual, please reference this diagram: http://ccnc.care/bpdiagram  Provider Name: [facility]     Phone: [phone]  Zone 1: ALL CLEAR  Continue to monitor  your symptoms:  BP reading is less than 140 (top number) or less than 90 (bottom number)  No right upper stomach pain No headaches or seeing spots No feeling nauseated or throwing up No swelling in face and hands  Zone 2: CAUTION Call your doctor's office for any of the following:  BP reading is greater than 140 (top number) or greater than 90 (bottom number)  Stomach pain under your ribs in the middle or right side Headaches or seeing spots Feeling nauseated or throwing up Swelling in face and hands  Zone 3: EMERGENCY  Seek  immediate medical care if you have any of the following:  BP reading is greater than160 (top number) or greater than 110 (bottom number) Severe headaches not improving with Tylenol Serious difficulty catching your breath Any worsening symptoms from Zone 2    First Trimester of Pregnancy The first trimester of pregnancy is from week 1 until the end of week 12 (months 1 through 3). A week after a sperm fertilizes an egg, the egg will implant on the wall of the uterus. This embryo will begin to develop into a baby. Genes from you and your partner are forming the baby. The female genes determine whether the baby is a boy or a girl. At 6-8 weeks, the eyes and face are formed, and the heartbeat can be seen on ultrasound. At the end of 12 weeks, all the baby's organs are formed.  Now that you are pregnant, you will want to do everything you can to have a healthy baby. Two of the most important things are to get good prenatal care and to follow your health care provider's instructions. Prenatal care is all the medical care you receive before the baby's birth. This care will help prevent, find, and treat any problems during the pregnancy and childbirth. BODY CHANGES Your body goes through many changes during pregnancy. The changes vary from woman to woman.  You may gain or lose a couple of pounds at first. You may feel sick to your stomach (nauseous) and throw up (vomit). If the vomiting is uncontrollable, call your health care provider. You may tire easily. You may develop headaches that can be relieved by medicines approved by your health care provider. You may urinate more often. Painful urination may mean you have a bladder infection. You may develop heartburn as a result of your pregnancy. You may develop constipation because certain hormones are causing the muscles that push waste through your intestines to slow down. You may develop hemorrhoids or swollen, bulging veins (varicose veins). Your breasts  may begin to grow larger and become tender. Your nipples may stick out more, and the tissue that surrounds them (areola) may become darker. Your gums may bleed and may be sensitive to brushing and flossing. Dark spots or blotches (chloasma, mask of pregnancy) may develop on your face. This will likely fade after the baby is born. Your menstrual periods will stop. You may have a loss of appetite. You may develop cravings for certain kinds of food. You may have changes in your emotions from day to day, such as being excited to be pregnant or being concerned that something may go wrong with the pregnancy and baby. You may have more vivid and strange dreams. You may have changes in your hair. These can include thickening of your hair, rapid growth, and changes in texture. Some women also have hair loss during or after pregnancy, or hair that feels dry or thin. Your hair will  most likely return to normal after your baby is born. WHAT TO EXPECT AT YOUR PRENATAL VISITS During a routine prenatal visit: You will be weighed to make sure you and the baby are growing normally. Your blood pressure will be taken. Your abdomen will be measured to track your baby's growth. The fetal heartbeat will be listened to starting around week 10 or 12 of your pregnancy. Test results from any previous visits will be discussed. Your health care provider may ask you: How you are feeling. If you are feeling the baby move. If you have had any abnormal symptoms, such as leaking fluid, bleeding, severe headaches, or abdominal cramping. If you have any questions. Other tests that may be performed during your first trimester include: Blood tests to find your blood type and to check for the presence of any previous infections. They will also be used to check for low iron levels (anemia) and Rh antibodies. Later in the pregnancy, blood tests for diabetes will be done along with other tests if problems develop. Urine tests to  check for infections, diabetes, or protein in the urine. An ultrasound to confirm the proper growth and development of the baby. An amniocentesis to check for possible genetic problems. Fetal screens for spina bifida and Down syndrome. You may need other tests to make sure you and the baby are doing well. HOME CARE INSTRUCTIONS  Medicines Follow your health care provider's instructions regarding medicine use. Specific medicines may be either safe or unsafe to take during pregnancy. Take your prenatal vitamins as directed. If you develop constipation, try taking a stool softener if your health care provider approves. Diet Eat regular, well-balanced meals. Choose a variety of foods, such as meat or vegetable-based protein, fish, milk and low-fat dairy products, vegetables, fruits, and whole grain breads and cereals. Your health care provider will help you determine the amount of weight gain that is right for you. Avoid raw meat and uncooked cheese. These carry germs that can cause birth defects in the baby. Eating four or five small meals rather than three large meals a day may help relieve nausea and vomiting. If you start to feel nauseous, eating a few soda crackers can be helpful. Drinking liquids between meals instead of during meals also seems to help nausea and vomiting. If you develop constipation, eat more high-fiber foods, such as fresh vegetables or fruit and whole grains. Drink enough fluids to keep your urine clear or pale yellow. Activity and Exercise Exercise only as directed by your health care provider. Exercising will help you: Control your weight. Stay in shape. Be prepared for labor and delivery. Experiencing pain or cramping in the lower abdomen or low back is a good sign that you should stop exercising. Check with your health care provider before continuing normal exercises. Try to avoid standing for long periods of time. Move your legs often if you must stand in one place for  a long time. Avoid heavy lifting. Wear low-heeled shoes, and practice good posture. You may continue to have sex unless your health care provider directs you otherwise. Relief of Pain or Discomfort Wear a good support bra for breast tenderness.   Take warm sitz baths to soothe any pain or discomfort caused by hemorrhoids. Use hemorrhoid cream if your health care provider approves.   Rest with your legs elevated if you have leg cramps or low back pain. If you develop varicose veins in your legs, wear support hose. Elevate your feet for 15 minutes, 3-4  times a day. Limit salt in your diet. Prenatal Care Schedule your prenatal visits by the twelfth week of pregnancy. They are usually scheduled monthly at first, then more often in the last 2 months before delivery. Write down your questions. Take them to your prenatal visits. Keep all your prenatal visits as directed by your health care provider. Safety Wear your seat belt at all times when driving. Make a list of emergency phone numbers, including numbers for family, friends, the hospital, and police and fire departments. General Tips Ask your health care provider for a referral to a local prenatal education class. Begin classes no later than at the beginning of month 6 of your pregnancy. Ask for help if you have counseling or nutritional needs during pregnancy. Your health care provider can offer advice or refer you to specialists for help with various needs. Do not use hot tubs, steam rooms, or saunas. Do not douche or use tampons or scented sanitary pads. Do not cross your legs for long periods of time. Avoid cat litter boxes and soil used by cats. These carry germs that can cause birth defects in the baby and possibly loss of the fetus by miscarriage or stillbirth. Avoid all smoking, herbs, alcohol, and medicines not prescribed by your health care provider. Chemicals in these affect the formation and growth of the baby. Schedule a dentist  appointment. At home, brush your teeth with a soft toothbrush and be gentle when you floss. SEEK MEDICAL CARE IF:  You have dizziness. You have mild pelvic cramps, pelvic pressure, or nagging pain in the abdominal area. You have persistent nausea, vomiting, or diarrhea. You have a bad smelling vaginal discharge. You have pain with urination. You notice increased swelling in your face, hands, legs, or ankles. SEEK IMMEDIATE MEDICAL CARE IF:  You have a fever. You are leaking fluid from your vagina. You have spotting or bleeding from your vagina. You have severe abdominal cramping or pain. You have rapid weight gain or loss. You vomit blood or material that looks like coffee grounds. You are exposed to [place] measles and have never had them. You are exposed to fifth disease or chickenpox. You develop a severe headache. You have shortness of breath. You have any kind of trauma, such as from a fall or a car accident. Document Released: 08/19/2001 Document Revised: 01/09/2014 Document Reviewed: 07/05/2013 [facility] Patient Information 2015 [place], [place]. This information is not intended to replace advice given to you by your health care provider. Make sure you discuss any questions you have with your health care provider.

## 2023-04-14 NOTE — Progress Notes (Signed)
INITIAL OBSTETRICAL VISIT Patient name: Sandy Carter MRN [id number]  Date of birth: 06-16-2001 Chief Complaint:   Initial Prenatal Visit  History of Present Illness:   Sandy Carter is a 22 y.o. G48P1011 Caucasian female at [redacted]w[redacted]d by [place] at 9 weeks with an Estimated Date of Delivery: 10/16/23 being seen today for her initial obstetrical visit.   No LMP recorded. Patient is pregnant. Her obstetrical history is significant for  SAB x 1, term C/S for NRFHR remote from delivery during IOL for FGR .   Dep/anx- on lexapro 10mg  and atarax, declines IBH, has therapist she can call if needed ?h/o seizure disorder dx by PCP, never seen by neuro, no meds, last activity few months ago THC use for nausea and to ease stress Today she reports  some occ upper abdominal pain .  Last pap 05/01/21. Results were: NILM w/ HRHPV not done     04/14/2023    2:24 PM 08/16/2021    9:48 AM 05/01/2021    3:16 PM 03/13/2021    1:54 PM  Depression screen PHQ 2/9  Decreased Interest 3 0 0 1  Down, Depressed, Hopeless 3 1 0 2  PHQ - 2 Score 6 1 0 3  Altered sleeping 2 1 3 3   Tired, decreased energy 3 2 3 3   Change in appetite 2 2 0 0  Feeling bad or failure about yourself  2 0 0 0  Trouble concentrating 3 2 3  0  Moving slowly or fidgety/restless 0 0 0 0  Suicidal thoughts 0 0 0 0  PHQ-9 Score 18 8 9 9         04/14/2023    2:24 PM 08/16/2021    9:49 AM 05/01/2021    3:17 PM 03/13/2021    1:54 PM  GAD 7 : Generalized Anxiety Score  Nervous, Anxious, on Edge 3 2 3 2   Control/stop worrying 3 1 3 3   Worry too much - different things 3 3 2 2   Trouble relaxing 3 1 3  0  Restless 1 0 0 0  Easily annoyed or irritable 3 1 1 2   Afraid - awful might happen 2 0 1 0  Total GAD 7 Score 18 8 13 9      Review of Systems:   Pertinent items are noted in HPI Denies cramping/contractions, leakage of fluid, vaginal bleeding, abnormal vaginal discharge w/ itching/odor/irritation, headaches, visual changes, shortness of breath,  chest pain, abdominal pain, severe nausea/vomiting, or problems with urination or bowel movements unless otherwise stated above.  Pertinent History Reviewed:  Reviewed past medical,surgical, social, obstetrical and family history.  Reviewed problem list, medications and allergies. OB History  Gravida Para Term Preterm AB Living  3 1 1   1 1   SAB IAB Ectopic Multiple Live Births  1       1    # Outcome Date GA Lbr Len/2nd Weight Sex Type Anes PTL Lv  3 Current           2 SAB 09/2022          1 Term 10/19/21 [redacted]w[redacted]d  4 lb 14.3 oz (2.22 kg) M CS-LTranv Spinal N LIV     Complications: Fetal Intolerance, Fetal growth restriction antepartum   Physical Assessment:   Vitals:   04/14/23 1426  BP: 113/72  Pulse: 98  Weight: 114 lb (51.7 kg)  Body mass index is 20.85 kg/m.       Physical Examination:  General appearance - well appearing, and  in no distress  Mental status - alert, oriented to person, place, and time  Psych:  She has a normal mood and affect  Skin - warm and dry, normal color, no suspicious lesions noted  Chest - effort normal, all lung fields clear to auscultation bilaterally  Heart - normal rate and regular rhythm  Abdomen - soft, nontender  Extremities:  No swelling or varicosities noted  Thin prep pap is not done   Chaperone: N/A    TODAY'S NT [place] 13+4 wks,measurements c/w dates,posterior fundal placenta,normal ovaries,NB present,NT 1.5 mm,FHR 160 bpm,CRL 77.83 mm   No results found for this or any previous visit (from the past 24 hour(s)).  Assessment & Plan:  1) Low-Risk Pregnancy G3P1011 at [redacted]w[redacted]d with an Estimated Date of Delivery: 10/16/23   2) Initial OB visit  3) H/O C/S> for NRFHR remote from delivery, gave TOLAC consent to review  4) H/O FGR  5) H/O hyperthyroidism> no meds, will get TSH today  6) Dep/anx> on lexapro 10mg  and atarax, declines IBH, has therapist she can call if needed  7) ?seizure disorder> dx by PCP, never seen by neuro, no meds,  last activity few months ago, neuro referral entered  8) THC use> advised cessation  Meds: No orders of the defined types were placed in this encounter.   Initial labs obtained Continue prenatal vitamins Reviewed n/v relief measures and warning s/s to report Reviewed recommended weight gain based on pre-gravid BMI Encouraged well-balanced diet Genetic & carrier screening discussed: requests Panorama and NT/IT, declines Horizon  Ultrasound discussed; fetal survey: requested CCNC completed> form faxed if has or is planning to apply for medicaid The nature of [place] [facility] [employer] with multiple MDs and other Advanced Practice Providers was explained to patient; also emphasized that fellows, residents, and students are part of our team. Does have home bp cuff. Office bp cuff given: no. Rx sent: n/a. Check bp weekly, let us know if consistently >140/90.   Follow-up: Return in about 3 weeks (around 05/05/2023) for LROB, 2nd IT, CNM, in person; then 6wks from now anatomy u/s and LROB w/ CNM.   Orders Placed This Encounter  Procedures   Urine Culture   GC/Chlamydia Probe Amp   PANORAMA PRENATAL TEST   CBC/D/Plt+RPR+Rh+ABO+RubIgG...   TSH   Integrated 1    Cheral Marker CNM, [facility] 04/14/2023 3:06 PM

## 2023-04-16 ENCOUNTER — Encounter: Payer: Self-pay | Admitting: Women's Health

## 2023-04-17 ENCOUNTER — Other Ambulatory Visit: Payer: Self-pay | Admitting: Obstetrics & Gynecology

## 2023-04-17 MED ORDER — HYDROXYZINE HCL 25 MG PO TABS: 25.0000 mg | ORAL_TABLET | Freq: Two times a day (BID) | ORAL | 5 refills | Status: AC | PRN

## 2023-04-17 NOTE — Progress Notes (Signed)
Rx for atarax

## 2023-04-20 MED ORDER — NITROFURANTOIN MONOHYD MACRO 100 MG PO CAPS: 100.0000 mg | ORAL_CAPSULE | Freq: Two times a day (BID) | ORAL | 0 refills | Status: DC

## 2023-04-20 NOTE — Addendum Note (Signed)
Addended by: Cheral Marker on: 04/20/2023 09:21 AM   Modules accepted: Orders

## 2023-04-27 ENCOUNTER — Encounter: Payer: Self-pay | Admitting: Women's Health

## 2023-04-30 ENCOUNTER — Encounter: Payer: Self-pay | Admitting: Women's Health

## 2023-05-06 ENCOUNTER — Ambulatory Visit (INDEPENDENT_AMBULATORY_CARE_PROVIDER_SITE_OTHER): Payer: MEDICAID | Admitting: Obstetrics and Gynecology

## 2023-05-06 ENCOUNTER — Encounter: Payer: Self-pay | Admitting: Obstetrics and Gynecology

## 2023-05-06 VITALS — BP 112/69 | HR 92 | Wt 112.0 lb

## 2023-05-06 DIAGNOSIS — Z87440 Personal history of urinary (tract) infections: Secondary | ICD-10-CM

## 2023-05-06 DIAGNOSIS — Z98891 History of uterine scar from previous surgery: Secondary | ICD-10-CM

## 2023-05-06 DIAGNOSIS — O99891 Other specified diseases and conditions complicating pregnancy: Secondary | ICD-10-CM

## 2023-05-06 DIAGNOSIS — R8271 Bacteriuria: Secondary | ICD-10-CM

## 2023-05-06 DIAGNOSIS — Z3A16 16 weeks gestation of pregnancy: Secondary | ICD-10-CM

## 2023-05-06 DIAGNOSIS — Z3482 Encounter for supervision of other normal pregnancy, second trimester: Secondary | ICD-10-CM

## 2023-05-06 NOTE — Progress Notes (Signed)
   PRENATAL VISIT NOTE  Subjective:  Sandy Carter is a 22 y.o. G3P1011 at [redacted]w[redacted]d being seen today for ongoing prenatal care.  She is currently monitored for the following issues for this low-risk pregnancy and has Disruptive mood dysregulation disorder (HCC); Reactive attachment disorder of early childhood; ODD (oppositional defiant disorder); MDD (major depressive disorder), recurrent severe, without psychosis (HCC); Rectal bleeding; HNP (herniated nucleus pulposus), lumbar; Hyperthyroidism; Asymptomatic bacteriuria during pregnancy in first trimester; Marijuana use; History of prior pregnancy with IUGR newborn; Acute appendicitis; Cesarean delivery delivered; Arthralgia; Difficulty controlling anger; Hx of self mutilation; Moderate episode of recurrent major depressive disorder (HCC); Supervision of normal pregnancy; Seizure disorder (HCC); and Depression with anxiety on their problem list.  Patient reports .   . Vag. Bleeding: None.  Movement: Present. Denies leaking of fluid.   The following portions of the patient's history were reviewed and updated as appropriate: allergies, current medications, past family history, past medical history, past social history, past surgical history and problem list.   Objective:   Vitals:   05/06/23 1535  BP: 112/69  Pulse: 92  Weight: 112 lb (50.8 kg)    Fetal Status: Fetal Heart Rate (bpm): 151   Movement: Present     General:  Alert, oriented and cooperative. Patient is in no acute distress.  Skin: Skin is warm and dry. No rash noted.   Cardiovascular: Normal heart rate noted  Respiratory: Normal respiratory effort, no problems with respiration noted  Abdomen: Soft, gravid, appropriate for gestational age.  Pain/Pressure: Absent     Pelvic: Cervical exam deferred        Extremities: Normal range of motion.  Edema: None  Mental Status: Normal mood and affect. Normal behavior. Normal judgment and thought content.   Assessment and Plan:   Pregnancy: G3P1011 at [redacted]w[redacted]d 1. Encounter for supervision of other normal pregnancy in second trimester BP and FHR normal Feeling regular fetal movement  - INTEGRATED 2 - Urine Culture  2. [redacted] weeks gestation of pregnancy Started taking lexapro a week ago  Got a call from neurology needs to schedule appt  Anatomy u/s scheduled 9/18 - INTEGRATED 2 - Urine Culture  3. History of urinary tract infection 4. Asymptomatic bacteriuria during pregnancy in first trimester Finished course of abx, checking today  - Urine Culture  5. History of cesarean delivery Still considering c/s vs tolac  Preterm labor symptoms and general obstetric precautions including but not limited to vaginal bleeding, contractions, leaking of fluid and fetal movement were reviewed in detail with the patient. Please refer to After Visit Summary for other counseling recommendations.   Return in 4 weeks for routine prenatal appointment  Future Appointments  Date Time Provider Department Center  05/27/2023  1:30 PM [facility] [place] [facility] None  05/27/2023  2:30 PM Cheral Marker, CNM [facility]    Albertine Grates, FNP

## 2023-05-08 LAB — INTEGRATED 2
AFP MoM: 0.96
Alpha-Fetoprotein: 38.5 ng/mL
Crown Rump Length: 77.8 mm
DIA MoM: 0.44
DIA Value: 84.2 pg/mL
Estriol, Unconjugated: 1.59 ng/mL
Gest. Age on Collection Date: 13.6 weeks
Gestational Age: 16.7 wk
Maternal Age at EDD: 23.8 a
Nuchal Translucency (NT): 1.5 mm
Nuchal Translucency MoM: 0.83
Number of Fetuses: 1
PAPP-A MoM: 0.62
PAPP-A Value: 1279 ng/mL
Test Results:: NEGATIVE
Weight: 114 [lb_av]
Weight: 114 [lb_av]
hCG MoM: 0.55
hCG Value: 19.9 [IU]/mL
uE3 MoM: 1.53

## 2023-05-08 LAB — URINE CULTURE

## 2023-05-09 ENCOUNTER — Emergency Department (HOSPITAL_COMMUNITY)
Admission: EM | Admit: 2023-05-09 | Discharge: 2023-05-09 | Disposition: A | Discharge status: Home / Self Care | Payer: MEDICAID | Attending: Emergency Medicine | Admitting: Emergency Medicine

## 2023-05-09 ENCOUNTER — Encounter: Payer: Self-pay | Admitting: Women's Health

## 2023-05-09 ENCOUNTER — Encounter (HOSPITAL_COMMUNITY): Payer: Self-pay

## 2023-05-09 ENCOUNTER — Other Ambulatory Visit: Payer: Self-pay

## 2023-05-09 DIAGNOSIS — O99012 Anemia complicating pregnancy, second trimester: Secondary | ICD-10-CM | POA: Diagnosis not present

## 2023-05-09 DIAGNOSIS — D649 Anemia, unspecified: Secondary | ICD-10-CM

## 2023-05-09 DIAGNOSIS — R109 Unspecified abdominal pain: Secondary | ICD-10-CM | POA: Diagnosis not present

## 2023-05-09 DIAGNOSIS — Z3A17 17 weeks gestation of pregnancy: Secondary | ICD-10-CM | POA: Diagnosis not present

## 2023-05-09 DIAGNOSIS — Z3492 Encounter for supervision of normal pregnancy, unspecified, second trimester: Secondary | ICD-10-CM

## 2023-05-09 DIAGNOSIS — O26892 Other specified pregnancy related conditions, second trimester: Secondary | ICD-10-CM | POA: Diagnosis not present

## 2023-05-09 DIAGNOSIS — N3001 Acute cystitis with hematuria: Secondary | ICD-10-CM

## 2023-05-09 LAB — URINALYSIS, W/ REFLEX TO CULTURE (INFECTION SUSPECTED)
Bilirubin Urine: NEGATIVE
Glucose, UA: NEGATIVE mg/dL
Ketones, ur: 20 mg/dL — AB
Nitrite: NEGATIVE
Protein, ur: 30 mg/dL — AB
Specific Gravity, Urine: 1.019 (ref 1.005–1.030)
WBC, UA: >50 WBC/hpf (ref 0–5)
pH: 6 (ref 5.0–8.0)

## 2023-05-09 LAB — BASIC METABOLIC PANEL
Anion gap: 7 (ref 5–15)
BUN: 9 mg/dL (ref 6–20)
CO2: 23 mmol/L (ref 22–32)
Calcium: 8.7 mg/dL — ABNORMAL LOW (ref 8.9–10.3)
Chloride: 102 mmol/L (ref 98–111)
Creatinine, Ser: 0.54 mg/dL (ref 0.44–1.00)
GFR, Estimated: >60 mL/min (ref >60)
Glucose, Bld: 127 mg/dL — ABNORMAL HIGH (ref 70–99)
Potassium: 3.4 mmol/L — ABNORMAL LOW (ref 3.5–5.1)
Sodium: 132 mmol/L — ABNORMAL LOW (ref 135–145)

## 2023-05-09 LAB — CBC WITH DIFFERENTIAL/PLATELET
Abs Immature Granulocytes: 0.04 10*3/uL (ref 0.00–0.07)
Basophils Absolute: 0 10*3/uL (ref 0.0–0.1)
Basophils Relative: 0 %
Eosinophils Absolute: 0 10*3/uL (ref 0.0–0.5)
Eosinophils Relative: 0 %
HCT: 32.8 % — ABNORMAL LOW (ref 36.0–46.0)
Hemoglobin: 11.5 g/dL — ABNORMAL LOW (ref 12.0–15.0)
Immature Granulocytes: 0 %
Lymphocytes Relative: 16 %
Lymphs Abs: 1.6 10*3/uL (ref 0.7–4.0)
MCH: 31.9 pg (ref 26.0–34.0)
MCHC: 35.1 g/dL (ref 30.0–36.0)
MCV: 90.9 fL (ref 80.0–100.0)
Monocytes Absolute: 0.4 10*3/uL (ref 0.1–1.0)
Monocytes Relative: 5 %
Neutro Abs: 7.5 10*3/uL (ref 1.7–7.7)
Neutrophils Relative %: 79 %
Platelets: 256 10*3/uL (ref 150–400)
RBC: 3.61 MIL/uL — ABNORMAL LOW (ref 3.87–5.11)
RDW: 13.2 % (ref 11.5–15.5)
WBC: 9.6 10*3/uL (ref 4.0–10.5)
nRBC: 0 % (ref 0.0–0.2)

## 2023-05-09 MED ORDER — SODIUM CHLORIDE 0.9 % IV SOLN
2.0000 g | Freq: Once | INTRAVENOUS | Status: AC
  Administered 2023-05-09: 2 g via INTRAVENOUS
  Filled 2023-05-09: qty 20

## 2023-05-09 MED ORDER — HYDROCODONE-ACETAMINOPHEN 5-325 MG PO TABS: ORAL_TABLET | ORAL | 0 refills | Status: DC

## 2023-05-09 MED ORDER — ONDANSETRON 4 MG PO TBDP: ORAL_TABLET | ORAL | 0 refills | Status: DC

## 2023-05-09 MED ORDER — CEPHALEXIN 500 MG PO CAPS: 500.0000 mg | ORAL_CAPSULE | Freq: Four times a day (QID) | ORAL | 0 refills | Status: DC

## 2023-05-09 NOTE — ED Triage Notes (Signed)
Pt arrived from home via POV c/o persistent back pain, rib pain, and reports recent UTI. Pt reports finishing full course of Nitrofurantoin, w/o relief. Pt reports being [redacted]w[redacted]d pregnant.

## 2023-05-09 NOTE — ED Provider Notes (Signed)
[place] EMERGENCY DEPARTMENT AT [facility] Provider Note   CSN: [id number] Arrival date & time: 05/09/23  0551     History  Chief Complaint  Patient presents with   Back Pain    Sandy Carter is a 22 y.o. female.  The history is provided by the patient.  Back Pain She is [redacted] weeks pregnant and comes in complaining of back pain which is worst in the right flank but also present on the left side in the midline.  She was diagnosed with a UTI recently and treated with nitrofurantoin but repeat urine showed persistence of infection.  She has not been given additional antibiotics.  Her back pain started about 3 days ago and it has been getting worse.  She has taken acetaminophen for pain without relief.  She was unable to sleep tonight.  She has had some chills but denies fever or sweats.  She denies dysuria or urinary urgency or frequency.  She has been noticing normal fetal movement.   Home Medications Prior to Admission medications   Medication Sig Start Date End Date Taking? Authorizing Provider  Blood Pressure Monitor MISC For regular home bp monitoring during pregnancy 05/01/21   Cresenzo-Dishmon, Scarlette Calico, CNM  escitalopram (LEXAPRO) 10 MG tablet Take 1 tablet (10 mg total) by mouth daily. 11/13/21 12/19/22  Myna Hidalgo, DO  hydrOXYzine (ATARAX) 25 MG tablet Take 1 tablet (25 mg total) by mouth 2 (two) times daily as needed. Patient not taking: Reported on 05/06/2023 04/17/23 05/17/23  Myna Hidalgo, DO  Prenatal Vit-Fe Fumarate-FA (PRENATAL VITAMIN PO) Take by mouth.    [provider]  PROAIR HFA 108 (90 Base) MCG/ACT inhaler Inhale 1 puff into the lungs every 6 (six) hours as needed for wheezing or shortness of breath. Patient not taking: Reported on 02/24/2023 09/18/22   Cresenzo-Dishmon, Scarlette Calico, CNM  ARIPiprazole (ABILIFY) 5 MG tablet Take 1 tablet (5 mg total) by mouth 2 (two) times daily. 02/20/16 04/14/20  Denzil Magnuson, NP  etonogestrel (NEXPLANON) 68 MG  IMPL implant 1 each by Subdermal route once. Implanted 02/05/16  04/14/20  [provider]  FLUoxetine (PROZAC) 40 MG capsule Take 1 capsule (40 mg total) by mouth daily. 02/20/16 04/14/20  Denzil Magnuson, NP  lamoTRIgine (LAMICTAL) 200 MG tablet Take 1 tablet (200 mg total) by mouth every evening. 02/20/16 04/14/20  Denzil Magnuson, NP  prazosin (MINIPRESS) 1 MG capsule Take 1 capsule (1 mg total) by mouth at bedtime. 02/20/16 04/14/20  Denzil Magnuson, NP      Allergies    Depo-provera [medroxyprogesterone acetate], Penicillins, Gabapentin, Other, and Penicillin g    Review of Systems   Review of Systems  Musculoskeletal:  Positive for back pain.  All other systems reviewed and are negative.   Physical Exam Updated Vital Signs BP 105/65   Pulse (!) 112   Temp 98.1 F (36.7 C) (Oral)   Resp 18   Ht 5\' 2"  (1.575 m)   Wt 51 kg   SpO2 98%   BMI 20.56 kg/m  Physical Exam Vitals and nursing note reviewed.   22 year old female, resting comfortably and in no acute distress. Vital signs are normal for a patient who is pregnant. Oxygen saturation is 99%, which is normal. Head is normocephalic and atraumatic. PERRLA, EOMI.  Back is nontender in the midline.  There is mild to moderate right CVA tenderness, no left CVA tenderness. Lungs are clear without rales, wheezes, or rhonchi. Chest is nontender. Heart has regular rate  and rhythm without murmur. Abdomen is soft, flat, nontender.  Gravid uterus is present, size consistent with dates. Extremities have no cyanosis or edema, full range of motion is present. Skin is warm and dry without rash. Neurologic: Mental status is normal, cranial nerves are intact, moves all extremities equally.  ED Results / Procedures / Treatments   Labs (all labs ordered are listed, but only abnormal results are displayed) Labs Reviewed  CBC WITH DIFFERENTIAL/PLATELET - Abnormal; Notable for the following components:      Result Value   RBC 3.61 (*)     Hemoglobin 11.5 (*)    HCT 32.8 (*)    All other components within normal limits  BASIC METABOLIC PANEL  URINALYSIS, W/ REFLEX TO CULTURE (INFECTION SUSPECTED)   Procedures Procedures    Medications Ordered in ED Medications - No data to display  ED Course/ Medical Decision Making/ [name]                                 Medical Decision Making Amount and/or Complexity of Data Reviewed Labs: ordered.   Right flank pain in the setting of known UTI and patient in second trimester pregnancy, concerning for developing pyelonephritis.  I have reviewed her past records, and urine cultures on 04/14/2023 and 05/06/2023 showed E. coli which was generally sensitive and was listed as sensitive to nitrofurantoin which she had taken.  I have ordered repeat urinalysis and culture, screening labs of CBC and basic metabolic panel.  I anticipate that she might need a more potent antibiotic.  CBC shows normal WBC and mild anemia consistent with pregnancy, per my interpretation.  Urinalysis and basic metabolic panel are pending.  Case is signed out to Dr. Estell Harpin.   Final Clinical Impression(s) / ED Diagnoses Final diagnoses:  Right flank pain  Second trimester pregnancy  Normochromic normocytic anemia    Rx / DC Orders ED Discharge Orders     None         Dione Booze, MD 05/09/23 [phone]

## 2023-05-09 NOTE — Discharge Instructions (Signed)
Drink plenty of fluids.  Follow-up with your doctor next week.  If you are getting worse over the weekend you should go to [facility]

## 2023-05-20 ENCOUNTER — Encounter: Payer: Self-pay | Admitting: Women's Health

## 2023-05-26 ENCOUNTER — Other Ambulatory Visit: Payer: Self-pay | Admitting: Obstetrics & Gynecology

## 2023-05-26 DIAGNOSIS — Z363 Encounter for antenatal screening for malformations: Secondary | ICD-10-CM

## 2023-05-27 ENCOUNTER — Encounter: Payer: Self-pay | Admitting: Advanced Practice Midwife

## 2023-05-27 ENCOUNTER — Ambulatory Visit (INDEPENDENT_AMBULATORY_CARE_PROVIDER_SITE_OTHER): Payer: MEDICAID | Admitting: Advanced Practice Midwife

## 2023-05-27 ENCOUNTER — Ambulatory Visit: Payer: MEDICAID

## 2023-05-27 VITALS — BP 104/68 | HR 98 | Wt 116.5 lb

## 2023-05-27 DIAGNOSIS — Z363 Encounter for antenatal screening for malformations: Secondary | ICD-10-CM | POA: Diagnosis not present

## 2023-05-27 DIAGNOSIS — Z3A19 19 weeks gestation of pregnancy: Secondary | ICD-10-CM

## 2023-05-27 DIAGNOSIS — Z87440 Personal history of urinary (tract) infections: Secondary | ICD-10-CM

## 2023-05-27 DIAGNOSIS — O99891 Other specified diseases and conditions complicating pregnancy: Secondary | ICD-10-CM

## 2023-05-27 DIAGNOSIS — R300 Dysuria: Secondary | ICD-10-CM

## 2023-05-27 DIAGNOSIS — R8271 Bacteriuria: Secondary | ICD-10-CM

## 2023-05-27 DIAGNOSIS — Z3482 Encounter for supervision of other normal pregnancy, second trimester: Secondary | ICD-10-CM

## 2023-05-27 LAB — POCT URINALYSIS DIPSTICK OB
Glucose, UA: NEGATIVE
Ketones, UA: NEGATIVE
Nitrite, UA: NEGATIVE
POC,PROTEIN,UA: NEGATIVE

## 2023-05-27 NOTE — Progress Notes (Signed)
[place] 19+5 wks,breech,posterior placenta gr 0,normal ovaries,cx 2.9 cm,SVP of fluid 3.9 cm,FHR 166 bpm,LVEICF,EFW 360 g 86%,anatomy complete,limited view because of fetal position

## 2023-05-27 NOTE — Patient Instructions (Signed)
Sandy Carter, thank you for choosing our office today! We appreciate the opportunity to meet your healthcare needs. You may receive a short survey by mail, e-mail, or through [employer]. If you are happy with your care we would appreciate if you could take just a few minutes to complete the survey questions. We read all of your comments and take your feedback very seriously. Thank you again for choosing our office.  Center for [employer] Team at [facility] at [facility] ([address] [place], [place] [address]) Entrance C, located off of [address] Free 24/7 valet parking  Go to [email or web address] to register for FREE online childbirth classes  Call the office [phone]) or go to [facility] if: You begin to severe cramping Your water breaks.  Sometimes it is a big gush of fluid, sometimes it is just a trickle that keeps getting your panties wet or running down your legs You have vaginal bleeding.  It is normal to have a small amount of spotting if your cervix was checked.   [facility] Pediatricians/Family Doctors [facility]): [address] Colette Ribas, [phone]           [facility]: [address], [phone]                [facility] Medicine [facility]): [address], [phone] (call to ask if accepting patients) [facility] Department: [address], [place], [phone]    [facility] Pediatricians/Family Doctors [facility]): [phone] [address], [phone] [facility]: [address] [place], [phone] [facility]: [address], [phone]  [facility] Doctors  [facility]): [phone] [facility]: [address] [phone]   [facility] Doctors [facility]: [address], [phone]  [facility] Doctors  [facility]: [phone], [phone]  Home Blood Pressure Monitoring for Patients   Your provider has recommended that you check your blood pressure (BP) at least once a week at home. If you do not have a blood pressure cuff at home, one will be provided for you. Contact your provider if you have not received your monitor within 1 week.   Helpful Tips for Accurate Home Blood Pressure Checks  Don't smoke, exercise, or drink caffeine 30 minutes before checking your BP Use the restroom before checking your BP (a full bladder can raise your pressure) Relax in a comfortable upright chair Feet on the ground Left arm resting comfortably on a flat surface at the level of your heart Legs uncrossed Back supported Sit quietly and don't talk Place the cuff on your bare arm Adjust snuggly, so that only two fingertips can fit between your skin and the top of the cuff Check 2 readings separated by at least one minute Keep a log of your BP readings For a visual, please reference this diagram: http://ccnc.care/bpdiagram  Provider Name: [facility]     Phone: [phone]  Zone 1: ALL CLEAR  Continue to monitor your symptoms:  BP reading is less than 140 (top number) or less than 90 (bottom number)  No right upper stomach pain No headaches or seeing spots No feeling nauseated or throwing up No swelling in face and hands  Zone 2: CAUTION Call your doctor's office for any of the following:  BP reading is greater than 140 (top number) or greater than  90 (bottom number)  Stomach pain under your ribs in the middle or right side Headaches or seeing spots Feeling nauseated or throwing up Swelling in face and hands  Zone 3: EMERGENCY  Seek immediate medical care if you have any of the following:  BP reading is greater than160 (top number) or greater than 110 (bottom number) Severe headaches not improving with Tylenol Serious difficulty catching your breath Any worsening symptoms from  Zone 2     Second Trimester of Pregnancy The second trimester is from week 14 through week 27 (months 4 through 6). The second trimester is often a time when you feel your best. Your body has adjusted to being pregnant, and you begin to feel better physically. Usually, morning sickness has lessened or quit completely, you may have more energy, and you may have an increase in appetite. The second trimester is also a time when the fetus is growing rapidly. At the end of the sixth month, the fetus is about 9 inches long and weighs about 1 pounds. You will likely begin to feel the baby move (quickening) between 16 and 20 weeks of pregnancy. Body changes during your second trimester Your body continues to go through many changes during your second trimester. The changes vary from woman to woman. Your weight will continue to increase. You will notice your lower abdomen bulging out. You may begin to get stretch marks on your hips, abdomen, and breasts. You may develop headaches that can be relieved by medicines. The medicines should be approved by your health care provider. You may urinate more often because the fetus is pressing on your bladder. You may develop or continue to have heartburn as a result of your pregnancy. You may develop constipation because certain hormones are causing the muscles that push waste through your intestines to slow down. You may develop hemorrhoids or swollen, bulging veins (varicose veins). You may have back pain. This is caused by: Weight gain. Pregnancy hormones that are relaxing the joints in your pelvis. A shift in weight and the muscles that support your balance. Your breasts will continue to grow and they will continue to become tender. Your gums may bleed and may be sensitive to brushing and flossing. Dark spots or blotches (chloasma, mask of pregnancy) may develop on your face. This will likely fade after the baby is born. A dark line from your belly button to  the pubic area (linea nigra) may appear. This will likely fade after the baby is born. You may have changes in your hair. These can include thickening of your hair, rapid growth, and changes in texture. Some women also have hair loss during or after pregnancy, or hair that feels dry or thin. Your hair will most likely return to normal after your baby is born.  What to expect at prenatal visits During a routine prenatal visit: You will be weighed to make sure you and the fetus are growing normally. Your blood pressure will be taken. Your abdomen will be measured to track your baby's growth. The fetal heartbeat will be listened to. Any test results from the previous visit will be discussed.  Your health care provider may ask you: How you are feeling. If you are feeling the baby move. If you have had any abnormal symptoms, such as leaking fluid, bleeding, severe headaches, or abdominal cramping. If you are using any tobacco products, including cigarettes, chewing tobacco, and electronic cigarettes. If you have any questions.  Other tests that may be performed during  your second trimester include: Blood tests that check for: Low iron levels (anemia). High blood sugar that affects pregnant women (gestational diabetes) between 72 and 28 weeks. Rh antibodies. This is to check for a protein on red blood cells (Rh factor). Urine tests to check for infections, diabetes, or protein in the urine. An ultrasound to confirm the proper growth and development of the baby. An amniocentesis to check for possible genetic problems. Fetal screens for spina bifida and Down syndrome. HIV (human immunodeficiency virus) testing. Routine prenatal testing includes screening for HIV, unless you choose not to have this test.  Follow these instructions at home: Medicines Follow your health care provider's instructions regarding medicine use. Specific medicines may be either safe or unsafe to take during  pregnancy. Take a prenatal vitamin that contains at least 600 micrograms (mcg) of folic acid. If you develop constipation, try taking a stool softener if your health care provider approves. Eating and drinking Eat a balanced diet that includes fresh fruits and vegetables, whole grains, good sources of protein such as meat, eggs, or tofu, and low-fat dairy. Your health care provider will help you determine the amount of weight gain that is right for you. Avoid raw meat and uncooked cheese. These carry germs that can cause birth defects in the baby. If you have low calcium intake from food, talk to your health care provider about whether you should take a daily calcium supplement. Limit foods that are high in fat and processed sugars, such as fried and sweet foods. To prevent constipation: Drink enough fluid to keep your urine clear or pale yellow. Eat foods that are high in fiber, such as fresh fruits and vegetables, whole grains, and beans. Activity Exercise only as directed by your health care provider. Most women can continue their usual exercise routine during pregnancy. Try to exercise for 30 minutes at least 5 days a week. Stop exercising if you experience uterine contractions. Avoid heavy lifting, wear low heel shoes, and practice good posture. A sexual relationship may be continued unless your health care provider directs you otherwise. Relieving pain and discomfort Wear a good support bra to prevent discomfort from breast tenderness. Take warm sitz baths to soothe any pain or discomfort caused by hemorrhoids. Use hemorrhoid cream if your health care provider approves. Rest with your legs elevated if you have leg cramps or low back pain. If you develop varicose veins, wear support hose. Elevate your feet for 15 minutes, 3-4 times a day. Limit salt in your diet. Prenatal Care Write down your questions. Take them to your prenatal visits. Keep all your prenatal visits as told by your health  care provider. This is important. Safety Wear your seat belt at all times when driving. Make a list of emergency phone numbers, including numbers for family, friends, the hospital, and police and fire departments. General instructions Ask your health care provider for a referral to a local prenatal education class. Begin classes no later than the beginning of month 6 of your pregnancy. Ask for help if you have counseling or nutritional needs during pregnancy. Your health care provider can offer advice or refer you to specialists for help with various needs. Do not use hot tubs, steam rooms, or saunas. Do not douche or use tampons or scented sanitary pads. Do not cross your legs for long periods of time. Avoid cat litter boxes and soil used by cats. These carry germs that can cause birth defects in the baby and possibly loss of the  fetus by miscarriage or stillbirth. Avoid all smoking, herbs, alcohol, and unprescribed drugs. Chemicals in these products can affect the formation and growth of the baby. Do not use any products that contain nicotine or tobacco, such as cigarettes and e-cigarettes. If you need help quitting, ask your health care provider. Visit your dentist if you have not gone yet during your pregnancy. Use a soft toothbrush to brush your teeth and be gentle when you floss. Contact a health care provider if: You have dizziness. You have mild pelvic cramps, pelvic pressure, or nagging pain in the abdominal area. You have persistent nausea, vomiting, or diarrhea. You have a bad smelling vaginal discharge. You have pain when you urinate. Get help right away if: You have a fever. You are leaking fluid from your vagina. You have spotting or bleeding from your vagina. You have severe abdominal cramping or pain. You have rapid weight gain or weight loss. You have shortness of breath with chest pain. You notice sudden or extreme swelling of your face, hands, ankles, feet, or legs. You  have not felt your baby move in over an hour. You have severe headaches that do not go away when you take medicine. You have vision changes. Summary The second trimester is from week 14 through week 27 (months 4 through 6). It is also a time when the fetus is growing rapidly. Your body goes through many changes during pregnancy. The changes vary from woman to woman. Avoid all smoking, herbs, alcohol, and unprescribed drugs. These chemicals affect the formation and growth your baby. Do not use any tobacco products, such as cigarettes, chewing tobacco, and e-cigarettes. If you need help quitting, ask your health care provider. Contact your health care provider if you have any questions. Keep all prenatal visits as told by your health care provider. This is important. This information is not intended to replace advice given to you by your health care provider. Make sure you discuss any questions you have with your health care provider. Document Released: 08/19/2001 Document Revised: 01/31/2016 Document Reviewed: 10/26/2012 Elsevier Interactive Patient Education  2017 [employer].

## 2023-05-27 NOTE — Progress Notes (Signed)
LOW-RISK PREGNANCY VISIT Patient name: Sandy Carter MRN [id number]  Date of birth: March 31, 2001 Chief Complaint:   Routine Prenatal Visit (Ultrasound today)  History of Present Illness:   Sandy Carter is a 23 y.o. G73P1011 female at [redacted]w[redacted]d with an Estimated Date of Delivery: 10/16/23 being seen today for ongoing management of a low-risk pregnancy.  Today she reports  some bldg just outside of rectum a couple weeks ago, and now some burning after sex/urinating; finished Keflex for recent UTI  . Contractions: Irritability. Vag. Bleeding: None.  Movement: Present. denies leaking of fluid. Review of Systems:   Pertinent items are noted in HPI Denies abnormal vaginal discharge w/ itching/odor/irritation, headaches, visual changes, shortness of breath, chest pain, abdominal pain, severe nausea/vomiting, or problems with urination or bowel movements unless otherwise stated above. Pertinent History Reviewed:  Reviewed past medical,surgical, social, obstetrical and family history.  Reviewed problem list, medications and allergies. Physical Assessment:   Vitals:   05/27/23 1435  BP: 104/68  Pulse: 98  Weight: 116 lb 8 oz (52.8 kg)  Body mass index is 21.31 kg/m.        Physical Examination:   General appearance: Well appearing, and in no distress  Mental status: Alert, oriented to person, place, and time  Skin: Warm & dry  Cardiovascular: Normal heart rate noted  Respiratory: Normal respiratory effort, no distress  Abdomen: Soft, gravid, nontender  Pelvic: Cervical exam deferred         Extremities: Edema: None  Fetal Status: Fetal Heart Rate (bpm): 166 u/s   Movement: Present    Anatomy u/s: [place] 19+5 wks,breech,posterior placenta gr 0,normal ovaries,cx 2.9 cm,SVP of fluid 3.9 cm,FHR 166 bpm,LVEICF,EFW 360 g 86%,anatomy complete,limited view because of fetal position   Results for orders placed or performed in visit on 05/27/23 (from the past 24 hour(s))  POC Urinalysis Dipstick OB    Collection Time: 05/27/23  2:45 PM  Result Value Ref Range   Color, UA     Clarity, UA     Glucose, UA Negative Negative   Bilirubin, UA     Ketones, UA Negative    Spec Grav, UA     Blood, UA Trace-intact    pH, UA     POC,PROTEIN,UA Negative Negative, Trace, Small (1+), Moderate (2+), Large (3+), 4+   Urobilinogen, UA     Nitrite, UA Negative    Leukocytes, UA Small (1+) (A) Negative   Appearance     Odor      Assessment & Plan:  1) Low-risk pregnancy G3P1011 at [redacted]w[redacted]d with an Estimated Date of Delivery: 10/16/23   2) Recent UTI/early pyelo, finished Keflex; will send urine for culture today; occasional burning   Meds: No orders of the defined types were placed in this encounter.  Labs/procedures today: anatomy u/s  Plan:  Continue routine obstetrical care   Reviewed: Preterm labor symptoms and general obstetric precautions including but not limited to vaginal bleeding, contractions, leaking of fluid and fetal movement were reviewed in detail with the patient.  All questions were answered. Has home bp cuff. Check bp weekly, let us know if >140/90.   Follow-up: Return in about 4 weeks (around 06/24/2023) for LROB, in person.  Orders Placed This Encounter  Procedures   Urine Culture   POC Urinalysis Dipstick OB   Sandy Carter [facility] 05/27/2023 3:21 PM

## 2023-05-29 ENCOUNTER — Other Ambulatory Visit: Payer: Self-pay | Admitting: Advanced Practice Midwife

## 2023-05-29 DIAGNOSIS — R8271 Bacteriuria: Secondary | ICD-10-CM

## 2023-05-29 MED ORDER — SULFAMETHOXAZOLE-TRIMETHOPRIM 800-160 MG PO TABS: 1.0000 | ORAL_TABLET | Freq: Two times a day (BID) | ORAL | 0 refills | Status: AC

## 2023-05-29 MED ORDER — NITROFURANTOIN MONOHYD MACRO 100 MG PO CAPS: 100.0000 mg | ORAL_CAPSULE | Freq: Every day | ORAL | 4 refills | Status: DC

## 2023-05-30 LAB — URINE CULTURE

## 2023-05-31 ENCOUNTER — Observation Stay
Admission: EM | Admit: 2023-05-31 | Discharge: 2023-05-31 | Disposition: A | Discharge status: Home / Self Care | Payer: MEDICAID | Attending: Obstetrics and Gynecology | Admitting: Obstetrics and Gynecology

## 2023-05-31 ENCOUNTER — Other Ambulatory Visit: Payer: Self-pay

## 2023-05-31 ENCOUNTER — Encounter: Payer: Self-pay | Admitting: Obstetrics and Gynecology

## 2023-05-31 DIAGNOSIS — Z87891 Personal history of nicotine dependence: Secondary | ICD-10-CM | POA: Insufficient documentation

## 2023-05-31 DIAGNOSIS — Z79899 Other long term (current) drug therapy: Secondary | ICD-10-CM | POA: Diagnosis not present

## 2023-05-31 DIAGNOSIS — Z3A20 20 weeks gestation of pregnancy: Secondary | ICD-10-CM | POA: Diagnosis not present

## 2023-05-31 DIAGNOSIS — R109 Unspecified abdominal pain: Principal | ICD-10-CM | POA: Diagnosis present

## 2023-05-31 DIAGNOSIS — Z98891 History of uterine scar from previous surgery: Secondary | ICD-10-CM | POA: Diagnosis not present

## 2023-05-31 DIAGNOSIS — B9620 Unspecified Escherichia coli [E. coli] as the cause of diseases classified elsewhere: Secondary | ICD-10-CM | POA: Diagnosis not present

## 2023-05-31 DIAGNOSIS — O2342 Unspecified infection of urinary tract in pregnancy, second trimester: Secondary | ICD-10-CM | POA: Diagnosis not present

## 2023-05-31 DIAGNOSIS — O26892 Other specified pregnancy related conditions, second trimester: Secondary | ICD-10-CM | POA: Diagnosis present

## 2023-05-31 DIAGNOSIS — O99891 Other specified diseases and conditions complicating pregnancy: Secondary | ICD-10-CM | POA: Diagnosis not present

## 2023-05-31 DIAGNOSIS — R000 Tachycardia, unspecified: Secondary | ICD-10-CM | POA: Insufficient documentation

## 2023-05-31 DIAGNOSIS — O26899 Other specified pregnancy related conditions, unspecified trimester: Secondary | ICD-10-CM | POA: Diagnosis present

## 2023-05-31 DIAGNOSIS — R10A0 Flank pain, unspecified side: Secondary | ICD-10-CM | POA: Diagnosis present

## 2023-05-31 DIAGNOSIS — O2340 Unspecified infection of urinary tract in pregnancy, unspecified trimester: Secondary | ICD-10-CM | POA: Diagnosis present

## 2023-05-31 LAB — CBC
HCT: 35.2 % — ABNORMAL LOW (ref 36.0–46.0)
Hemoglobin: 12.2 g/dL (ref 12.0–15.0)
MCH: 31.5 pg (ref 26.0–34.0)
MCHC: 34.7 g/dL (ref 30.0–36.0)
MCV: 91 fL (ref 80.0–100.0)
Platelets: 276 10*3/uL (ref 150–400)
RBC: 3.87 MIL/uL (ref 3.87–5.11)
RDW: 13.1 % (ref 11.5–15.5)
WBC: 11.8 10*3/uL — ABNORMAL HIGH (ref 4.0–10.5)
nRBC: 0 % (ref 0.0–0.2)

## 2023-05-31 LAB — COMPREHENSIVE METABOLIC PANEL
ALT: 9 U/L (ref 0–44)
AST: 16 U/L (ref 15–41)
Albumin: 3.2 g/dL — ABNORMAL LOW (ref 3.5–5.0)
Alkaline Phosphatase: 76 U/L (ref 38–126)
Anion gap: 10 (ref 5–15)
BUN: 9 mg/dL (ref 6–20)
CO2: 24 mmol/L (ref 22–32)
Calcium: 8.8 mg/dL — ABNORMAL LOW (ref 8.9–10.3)
Chloride: 101 mmol/L (ref 98–111)
Creatinine, Ser: 0.44 mg/dL (ref 0.44–1.00)
GFR, Estimated: >60 mL/min (ref >60)
Glucose, Bld: 97 mg/dL (ref 70–99)
Potassium: 3.5 mmol/L (ref 3.5–5.1)
Sodium: 135 mmol/L (ref 135–145)
Total Bilirubin: 0.6 mg/dL (ref 0.3–1.2)
Total Protein: 7.8 g/dL (ref 6.5–8.1)

## 2023-05-31 LAB — URINE DRUG SCREEN, QUALITATIVE (ARMC ONLY)
Amphetamines, Ur Screen: NOT DETECTED
Barbiturates, Ur Screen: NOT DETECTED
Benzodiazepine, Ur Scrn: NOT DETECTED
Cannabinoid 50 Ng, Ur ~~LOC~~: NOT DETECTED
Cocaine Metabolite,Ur ~~LOC~~: NOT DETECTED
MDMA (Ecstasy)Ur Screen: NOT DETECTED
Methadone Scn, Ur: NOT DETECTED
Opiate, Ur Screen: NOT DETECTED
Phencyclidine (PCP) Ur S: NOT DETECTED
Tricyclic, Ur Screen: NOT DETECTED

## 2023-05-31 LAB — URINALYSIS, COMPLETE (UACMP) WITH MICROSCOPIC
Bilirubin Urine: NEGATIVE
Glucose, UA: NEGATIVE mg/dL
Hgb urine dipstick: NEGATIVE
Ketones, ur: NEGATIVE mg/dL
Nitrite: NEGATIVE
Protein, ur: NEGATIVE mg/dL
Specific Gravity, Urine: 1.015 (ref 1.005–1.030)
WBC, UA: >50 WBC/hpf (ref 0–5)
pH: 6 (ref 5.0–8.0)

## 2023-05-31 LAB — TSH: TSH: 1.485 u[IU]/mL (ref 0.350–4.500)

## 2023-05-31 MED ORDER — ACETAMINOPHEN 500 MG PO TABS
1000.0000 mg | ORAL_TABLET | Freq: Four times a day (QID) | ORAL | Status: DC | PRN
  Administered 2023-05-31: 1000 mg via ORAL
  Filled 2023-05-31: qty 2

## 2023-05-31 MED ORDER — SULFAMETHOXAZOLE-TRIMETHOPRIM 800-160 MG PO TABS
1.0000 | ORAL_TABLET | Freq: Once | ORAL | Status: AC
  Administered 2023-05-31: 1 via ORAL
  Filled 2023-05-31: qty 1

## 2023-05-31 MED ORDER — LACTATED RINGERS IV BOLUS
1000.0000 mL | Freq: Once | INTRAVENOUS | Status: AC
  Administered 2023-05-31: 1000 mL via INTRAVENOUS

## 2023-05-31 MED ORDER — SODIUM CHLORIDE 0.9 % IV SOLN
2.0000 g | Freq: Once | INTRAVENOUS | Status: AC
  Administered 2023-05-31: 2 g via INTRAVENOUS
  Filled 2023-05-31: qty 20

## 2023-05-31 MED ORDER — CALCIUM CARBONATE ANTACID 500 MG PO CHEW: 2.0000 | CHEWABLE_TABLET | ORAL | Status: DC | PRN

## 2023-05-31 NOTE — OB Triage Note (Addendum)
Pt presents to L&D via EMS. Reports she has had a UTI the past couple of weeks. Has been on 2 rounds of antibiotics which have unrelieved her pain. Reports right sided back pain. Denies bleeding, LOF, endorses positive fetal movement. Reports nausea. Increased HR. No fever. Monitors applied and assessing. Lamont Tant B. Anastasya Jewell, RN BSN 05/31/2023 5:11 PM

## 2023-05-31 NOTE — Discharge Summary (Signed)
Sandy Carter is a 22 y.o. female. She is at [redacted]w[redacted]d gestation. No LMP recorded. Patient is pregnant. 10/16/2023, by Ultrasound   Prenatal care site:  [facility]  Chief complaint: back pain   Admission Diagnoses:  1) intrauterine pregnancy at [redacted]w[redacted]d  2) Pregnancy with flank pain, antepartum [O26.899, R10.9]  Discharge Diagnoses:  Principal Problem:   Pregnancy with flank pain, antepartum Active Problems:   UTI (urinary tract infection) during pregnancy  HPI: Sandy Carter presents to L&D with complaints of back pain that became worse today.  She has been treated for E. Coli UTI in her pregnancy.  First with Macrobid and then most recently with Keflex. Urine culture on Friday was again positive for E. Coli.  Her provider sent in a prescription for Bactrim DS followed by Macrobid for suppression therapy.  She was not aware a prescription had been sent and has not picked up the new antibiotic regimen yet. She started having back pain last night that was worse on the right but now is bilateral and mid to upper back.  Denies n/v, denies body aches, or chills. Her pregnancy is complicated by recurrent UTI, previous c/section, hx of IUGR, depression and anxiety, hyperthyroidism (no meds), and tachycardia .  She denies Contractions, Loss of fluid, or Vaginal bleeding.   S: Resting comfortably. no CTX, no VB.no LOF. Endorses feel fetal movement.   Maternal Medical History:  Past Medical Hx:  has a past medical history of ADD (attention deficit disorder), Anxiety, Depression, Dysrhythmia, Headache, Hyperthyroidism, Lumbar herniated disc, ODD (oppositional defiant disorder), PONV (postoperative nausea and vomiting), Seizures (HCC), and Tuberculosis.    Past Surgical Hx:  has a past surgical history that includes Wisdom tooth extraction; Colonoscopy with propofol (N/A, 09/17/2020); Lumbar laminectomy/decompression microdiscectomy (Right, 12/28/2020); laparoscopic appendectomy (N/A, 10/17/2021); Cesarean section  (N/A, 10/19/2021); and Open reduction internal fixation (orif) metacarpal (Right, 12/08/2022).   Allergies  Allergen Reactions   Depo-Provera [Medroxyprogesterone Acetate] Hives, Shortness Of Breath and Cough   Penicillins Rash    Reaction: 5 years   Gabapentin Other (See Comments)    Headaches, lightheaded   Other Nausea And Vomiting    Pt has nausea and vomiting post procedure    Penicillin G Other (See Comments)     Prior to Admission medications   Medication Sig Start Date End Date Taking? Authorizing Provider  Prenatal Vit-Fe Fumarate-FA (PRENATAL VITAMIN PO) Take by mouth.   Yes [provider]  Blood Pressure Monitor MISC For regular home bp monitoring during pregnancy Patient not taking: Reported on 05/27/2023 05/01/21   Cresenzo-Dishmon, Scarlette Calico, CNM  escitalopram (LEXAPRO) 10 MG tablet Take 1 tablet (10 mg total) by mouth daily. 11/13/21 12/19/22  Myna Hidalgo, DO  nitrofurantoin, macrocrystal-monohydrate, (MACROBID) 100 MG capsule Take 1 capsule (100 mg total) by mouth at bedtime. Begin this treatment after you complete your course of Bactrim and take one each night until delivery Patient not taking: Reported on 05/31/2023 05/29/23   [name], CNM  PROAIR HFA 108 [id number] Base) MCG/ACT inhaler Inhale 1 puff into the lungs every 6 (six) hours as needed for wheezing or shortness of breath. Patient not taking: Reported on 02/24/2023 09/18/22   Cresenzo-Dishmon, Scarlette Calico, CNM  sulfamethoxazole-trimethoprim (BACTRIM DS) 800-160 MG tablet Take 1 tablet by mouth 2 (two) times daily for 14 days. Patient not taking: Reported on 05/31/2023 05/29/23 06/12/23  [name], CNM  ARIPiprazole (ABILIFY) 5 MG tablet Take 1 tablet (5 mg total) by mouth 2 (two) times daily. 02/20/16  04/14/20  Denzil Magnuson, NP  etonogestrel (NEXPLANON) 68 MG IMPL implant 1 each by Subdermal route once. Implanted 02/05/16  04/14/20  [provider]  FLUoxetine (PROZAC) 40 MG capsule Take 1 capsule (40 mg  total) by mouth daily. 02/20/16 04/14/20  Denzil Magnuson, NP  lamoTRIgine (LAMICTAL) 200 MG tablet Take 1 tablet (200 mg total) by mouth every evening. 02/20/16 04/14/20  Denzil Magnuson, NP  prazosin (MINIPRESS) 1 MG capsule Take 1 capsule (1 mg total) by mouth at bedtime. 02/20/16 04/14/20  Denzil Magnuson, NP    Social History: She  reports that she has quit smoking. Her smoking use included cigarettes. She has never used smokeless tobacco. She reports that she does not currently use alcohol. She reports current drug use. Drug: Marijuana.  Family History: family history includes Alcoholism in her mother; Bone cancer in her mother; Diabetes in her maternal grandmother. She was adopted.   Review of Systems: A full review of systems was performed and negative except as noted in the HPI.     Pertinent Results:  O:  BP 111/67 (BP Location: Right Arm)   Pulse (!) 104   Temp 98.5 F (36.9 C) (Oral)   Resp 18   Ht 5' 2.01" (1.575 m)   Wt 52.6 kg   SpO2 99%   BMI 21.21 kg/m  Results for orders placed or performed during the hospital encounter of 05/31/23 (from the past 48 hour(s))  Comprehensive metabolic panel   Collection Time: 05/31/23  5:57 PM  Result Value Ref Range   Sodium 135 135 - 145 mmol/L   Potassium 3.5 3.5 - 5.1 mmol/L   Chloride 101 98 - 111 mmol/L   CO2 24 22 - 32 mmol/L   Glucose, Bld 97 70 - 99 mg/dL   BUN 9 6 - 20 mg/dL   Creatinine, Ser 4.78 0.44 - 1.00 mg/dL   Calcium 8.8 (L) 8.9 - 10.3 mg/dL   Total Protein 7.8 6.5 - 8.1 g/dL   Albumin 3.2 (L) 3.5 - 5.0 g/dL   AST 16 15 - 41 U/L   ALT 9 0 - 44 U/L   Alkaline Phosphatase 76 38 - 126 U/L   Total Bilirubin 0.6 0.3 - 1.2 mg/dL   GFR, Estimated >29 >56 mL/min   Anion gap 10 5 - 15  CBC   Collection Time: 05/31/23  5:57 PM  Result Value Ref Range   WBC 11.8 (H) 4.0 - 10.5 K/uL   RBC 3.87 3.87 - 5.11 MIL/uL   Hemoglobin 12.2 12.0 - 15.0 g/dL   HCT 21.3 (L) 08.6 - 57.8 %   MCV 91.0 80.0 - 100.0 fL   MCH 31.5  26.0 - 34.0 pg   MCHC 34.7 30.0 - 36.0 g/dL   RDW 46.9 62.9 - 52.8 %   Platelets 276 150 - 400 K/uL   nRBC 0.0 0.0 - 0.2 %  Urinalysis, Complete w Microscopic -Urine, Clean Catch   Collection Time: 05/31/23  5:57 PM  Result Value Ref Range   Color, Urine YELLOW (A) YELLOW   APPearance CLOUDY (A) CLEAR   Specific Gravity, Urine 1.015 1.005 - 1.030   pH 6.0 5.0 - 8.0   Glucose, UA NEGATIVE NEGATIVE mg/dL   Hgb urine dipstick NEGATIVE NEGATIVE   Bilirubin Urine NEGATIVE NEGATIVE   Ketones, ur NEGATIVE NEGATIVE mg/dL   Protein, ur NEGATIVE NEGATIVE mg/dL   Nitrite NEGATIVE NEGATIVE   Leukocytes,Ua LARGE (A) NEGATIVE   RBC / HPF 6-10 0 - 5 RBC/hpf  WBC, UA >50 0 - 5 WBC/hpf   Bacteria, UA MANY (A) NONE SEEN   Squamous Epithelial / HPF 0-5 0 - 5 /HPF   Mucus PRESENT   TSH   Collection Time: 05/31/23  5:57 PM  Result Value Ref Range   TSH 1.485 0.350 - 4.500 uIU/mL  Urine Drug Screen, Qualitative ([facility] only)   Collection Time: 05/31/23  6:02 PM  Result Value Ref Range   Tricyclic, Ur Screen NONE DETECTED NONE DETECTED   Amphetamines, Ur Screen NONE DETECTED NONE DETECTED   MDMA (Ecstasy)Ur Screen NONE DETECTED NONE DETECTED   Cocaine Metabolite,Ur [place] NONE DETECTED NONE DETECTED   Opiate, Ur Screen NONE DETECTED NONE DETECTED   Phencyclidine (PCP) Ur S NONE DETECTED NONE DETECTED   Cannabinoid 50 Ng, Ur [place] NONE DETECTED NONE DETECTED   Barbiturates, Ur Screen NONE DETECTED NONE DETECTED   Benzodiazepine, Ur Scrn NONE DETECTED NONE DETECTED   Methadone Scn, Ur NONE DETECTED NONE DETECTED     Constitutional: NAD, AAOx3  HE/ENT: extraocular movements grossly intact, moist mucous membranes CV: RRR PULM: nl respiratory effort Abd: gravid, non-tender, non-distended, soft  Ext: Non-tender, Nonedmeatous Psych: mood appropriate, speech normal Pelvic : deferred  Fetal monitoring: 160 bpm distinguished from maternal pulse   Consults: None  Hospital Course: The patient was  admitted to Labor and Delivery Triage for observation. She initially had tachycardia into the 130's to 140's on arrival.  This improved after IV fluids.  ECG was c/w sinus tachycardia.  CMP and CBC were normal for pregnancy.  She did not have CVA tenderness and back pain was generalized to mid to upper back.  She remained afebrile and VSS. Rocephin IV was given for antibiotic coverage until she was able to pick up her prescription outpatient. We reviewed the risk of developing pyelonephritis in pregnancy d/t recurrent UTI's. Warning s/s were discussed. Sandy Carter was instructed to take the prescription for 14 days as directed followed by suppression therapy with Macrobid.  Her primary OB/GYN sent in the medication on Friday to her local pharmacy.  Sandy Carter also has a cardiology appointment for tachycardia (prior to pregnancy) scheduled for 06/04/2023.  She was deemed stable for discharge and outpatient follow up.   Plan: 1) UTI in pregnancy  -Rocephin IV given  -tachycardia improved with IV Fluid bolus  -WBC WNL for pregnancy and afebrile  -Discussed warning signs to return to L&D triage with  -Reviewed comfort measures    Discharge Condition: stable  Disposition: Discharge disposition: 01-Home or Self Care        Allergies as of 05/31/2023       Reactions   Depo-provera [medroxyprogesterone Acetate] Hives, Shortness Of Breath, Cough   Penicillins Rash   Reaction: 5 years   Gabapentin Other (See Comments)   Headaches, lightheaded   Other Nausea And Vomiting   Pt has nausea and vomiting post procedure    Penicillin G Other (See Comments)        Medication List     TAKE these medications    Blood Pressure Monitor Misc For regular home bp monitoring during pregnancy   escitalopram 10 MG tablet Commonly known as: Lexapro Take 1 tablet (10 mg total) by mouth daily.   nitrofurantoin (macrocrystal-monohydrate) 100 MG capsule Commonly known as: MACROBID Take 1 capsule (100 mg total)  by mouth at bedtime. Begin this treatment after you complete your course of Bactrim and take one each night until delivery   PRENATAL VITAMIN PO Take by mouth.  ProAir HFA 108 (90 Base) MCG/ACT inhaler Generic drug: albuterol Inhale 1 puff into the lungs every 6 (six) hours as needed for wheezing or shortness of breath.   sulfamethoxazole-trimethoprim 800-160 MG tablet Commonly known as: BACTRIM DS Take 1 tablet by mouth 2 (two) times daily for 14 days.        Follow-up Information     [facility]. Call in 2 day(s).   Specialty: Obstetrics and Gynecology Why: follow up appointment - can be telephone check in Contact information: [address] [place] [address] [phone]               ----- Margaretmary Eddy, CNM Certified Nurse Midwife [facility]

## 2023-06-02 ENCOUNTER — Encounter: Payer: Self-pay | Admitting: Obstetrics & Gynecology

## 2023-06-03 ENCOUNTER — Other Ambulatory Visit: Payer: Self-pay | Admitting: Advanced Practice Midwife

## 2023-06-03 DIAGNOSIS — R000 Tachycardia, unspecified: Secondary | ICD-10-CM

## 2023-06-03 NOTE — Progress Notes (Signed)
Order for Amb Referral to Cardio Ob (Dr Servando Salina) placed per pt request for tachycardia.  Sandy Carter 06/03/2023 3:32 PM

## 2023-06-09 ENCOUNTER — Encounter: Payer: Self-pay | Admitting: Women's Health

## 2023-06-16 ENCOUNTER — Encounter: Payer: Self-pay | Admitting: Obstetrics & Gynecology

## 2023-06-22 ENCOUNTER — Encounter: Payer: Self-pay | Admitting: Women's Health

## 2023-06-22 ENCOUNTER — Other Ambulatory Visit: Payer: Self-pay | Admitting: Obstetrics & Gynecology

## 2023-06-22 DIAGNOSIS — F321 Major depressive disorder, single episode, moderate: Secondary | ICD-10-CM

## 2023-06-22 MED ORDER — ESCITALOPRAM OXALATE 10 MG PO TABS
ORAL_TABLET | ORAL | 6 refills | Status: DC
Start: 2023-06-22 — End: 2023-10-08

## 2023-06-23 ENCOUNTER — Telehealth: Payer: Self-pay

## 2023-06-23 NOTE — Telephone Encounter (Signed)
Erroneous encounter

## 2023-06-24 ENCOUNTER — Ambulatory Visit (INDEPENDENT_AMBULATORY_CARE_PROVIDER_SITE_OTHER): Payer: MEDICAID | Admitting: Obstetrics and Gynecology

## 2023-06-24 VITALS — BP 114/69 | HR 78 | Wt 123.0 lb

## 2023-06-24 DIAGNOSIS — Z3482 Encounter for supervision of other normal pregnancy, second trimester: Secondary | ICD-10-CM

## 2023-06-24 DIAGNOSIS — Z87440 Personal history of urinary (tract) infections: Secondary | ICD-10-CM

## 2023-06-24 DIAGNOSIS — Z98891 History of uterine scar from previous surgery: Secondary | ICD-10-CM

## 2023-06-24 DIAGNOSIS — E0590 Thyrotoxicosis, unspecified without thyrotoxic crisis or storm: Secondary | ICD-10-CM

## 2023-06-24 DIAGNOSIS — Z3A23 23 weeks gestation of pregnancy: Secondary | ICD-10-CM

## 2023-06-24 NOTE — Progress Notes (Signed)
   PRENATAL VISIT NOTE  Subjective:  Sandy Carter is a 22 y.o. G3P1011 at [redacted]w[redacted]d being seen today for ongoing prenatal care.  She is currently monitored for the following issues for this low-risk pregnancy and has Disruptive mood dysregulation disorder (HCC); Reactive attachment disorder of early childhood; Oppositional defiant disorder; MDD (major depressive disorder), recurrent severe, without psychosis (HCC); Rectal bleeding; HNP (herniated nucleus pulposus), lumbar; Hyperthyroidism; Asymptomatic bacteriuria during pregnancy in first trimester; Marijuana use; History of prior pregnancy with IUGR newborn; Cesarean delivery delivered; Arthralgia; Difficulty controlling anger; Hx of self mutilation; Moderate episode of recurrent major depressive disorder (HCC); Supervision of normal pregnancy; Seizure disorder (HCC); Depression with anxiety; Pregnancy with flank pain, antepartum; and UTI (urinary tract infection) during pregnancy on their problem list.  Patient reports no complaints.  Contractions: Irritability. Vag. Bleeding: None.  Movement: Present. Denies leaking of fluid.   The following portions of the patient's history were reviewed and updated as appropriate: allergies, current medications, past family history, past medical history, past social history, past surgical history and problem list.   Objective:   Vitals:   06/24/23 1610  BP: 114/69  Pulse: 78  Weight: 123 lb (55.8 kg)    Fetal Status: Fetal Heart Rate (bpm): 156 Fundal Height: 23 cm Movement: Present     General:  Alert, oriented and cooperative. Patient is in no acute distress.  Skin: Skin is warm and dry. No rash noted.   Cardiovascular: Normal heart rate noted  Respiratory: Normal respiratory effort, no problems with respiration noted  Abdomen: Soft, gravid, appropriate for gestational age.  Pain/Pressure: Absent     Pelvic: Cervical exam deferred        Extremities: Normal range of motion.     Mental Status:  Normal mood and affect. Normal behavior. Normal judgment and thought content.   Assessment and Plan:  Pregnancy: G3P1011 at [redacted]w[redacted]d 1. Encounter for supervision of other normal pregnancy in second trimester BP and FHR normal Feeling regular fetal movement FH appropriate   2. History of urinary tract infection Finished bactrim, has been out of macrobid x 1 week. Is picking up today. Was doing culture today, urine thrown out before I put order in and patient had already left. Will repeat next visit Precautions given   3. History of cesarean delivery Leaning towards RCS  4. Hyperthyroidism Check TSH next visit   5. [redacted] weeks gestation of pregnancy PN2 next visit    Preterm labor symptoms and general obstetric precautions including but not limited to vaginal bleeding, contractions, leaking of fluid and fetal movement were reviewed in detail with the patient. Please refer to After Visit Summary for other counseling recommendations.   Return in about 4 weeks (around 07/22/2023) for OB VISIT (MD or APP), 2 hr GTT.  Future Appointments  Date Time Provider Department Center  07/17/2023  3:40 PM Thomasene Ripple, DO [facility] None  07/22/2023  8:30 AM [facility]  07/22/2023  9:30 AM Cheral Marker, CNM [facility]    Albertine Grates, FNP

## 2023-07-17 ENCOUNTER — Ambulatory Visit (INDEPENDENT_AMBULATORY_CARE_PROVIDER_SITE_OTHER): Payer: MEDICAID | Admitting: Cardiology

## 2023-07-17 ENCOUNTER — Ambulatory Visit: Payer: MEDICAID | Attending: Cardiology

## 2023-07-17 ENCOUNTER — Encounter: Payer: Self-pay | Admitting: Cardiology

## 2023-07-17 VITALS — BP 110/62 | Ht 62.0 in | Wt 129.0 lb

## 2023-07-17 DIAGNOSIS — R0789 Other chest pain: Secondary | ICD-10-CM | POA: Diagnosis not present

## 2023-07-17 DIAGNOSIS — R002 Palpitations: Secondary | ICD-10-CM | POA: Diagnosis not present

## 2023-07-17 DIAGNOSIS — R0609 Other forms of dyspnea: Secondary | ICD-10-CM | POA: Diagnosis not present

## 2023-07-17 DIAGNOSIS — Z3A27 27 weeks gestation of pregnancy: Secondary | ICD-10-CM | POA: Diagnosis not present

## 2023-07-17 NOTE — Patient Instructions (Addendum)
Medication Instructions:  Your physician recommends that you continue on your current medications as directed. Please refer to the Current Medication list given to you today.  *If you need a refill on your cardiac medications before your next appointment, please call your pharmacy*   Lab Work: None   Testing/Procedures: Your physician has requested that you have an echocardiogram OB. Echocardiography is a painless test that uses sound waves to create images of your heart. It provides your doctor with information about the size and shape of your heart and how well your heart's chambers and valves are working. This procedure takes approximately one hour. There are no restrictions for this procedure. Please do NOT wear cologne, perfume, aftershave, or lotions (deodorant is allowed). Please arrive 15 minutes prior to your appointment time.  Please note: We ask at that you not bring children with you during ultrasound (echo/ vascular) testing. Due to room size and safety concerns, children are not allowed in the ultrasound rooms during exams. Our front office staff cannot provide observation of children in our lobby area while testing is being conducted. An adult accompanying a patient to their appointment will only be allowed in the ultrasound room at the discretion of the ultrasound technician under special circumstances. We apologize for any inconvenience.  ZIO XT- Long Term Monitor Instructions  Your physician has requested you wear a ZIO patch monitor for 14 days.  This is a single patch monitor. [employer] supplies one patch monitor per enrollment. Additional stickers are not available. Please do not apply patch if you will be having a Nuclear Stress Test,  Echocardiogram, Cardiac CT, MRI, or Chest Xray during the period you would be wearing the  monitor. The patch cannot be worn during these tests. You cannot remove and re-apply the  ZIO XT patch monitor.  Your ZIO patch monitor will be mailed  3 day USPS to your address on file. It may take 3-5 days  to receive your monitor after you have been enrolled.  Once you have received your monitor, please review the enclosed instructions. Your monitor  has already been registered assigning a specific monitor serial # to you.  Billing and Patient Assistance Program Information  We have supplied [employer] with any of your insurance information on file for billing purposes. [employer] offers a sliding scale Patient Assistance Program for patients that do not have  insurance, or whose insurance does not completely cover the cost of the ZIO monitor.  You must apply for the Patient Assistance Program to qualify for this discounted rate.  To apply, please call [employer] at [phone], select option 4, select option 2, ask to apply for  Patient Assistance Program. Meredeth Ide will ask your household income, and how many people  are in your household. They will quote your out-of-pocket cost based on that information.  [employer] will also be able to set up a 8-month, interest-free payment plan if needed.  Applying the monitor   Shave hair from upper left chest.  Hold abrader disc by orange tab. Rub abrader in 40 strokes over the upper left chest as  indicated in your monitor instructions.  Clean area with 4 enclosed alcohol pads. Let dry.  Apply patch as indicated in monitor instructions. Patch will be placed under collarbone on left  side of chest with arrow pointing upward.  Rub patch adhesive wings for 2 minutes. Remove white label marked "1". Remove the white  label marked "2". Rub patch adhesive wings for 2 additional minutes.  While looking  in a mirror, press and release button in center of patch. A small green light will  flash 3-4 times. This will be your only indicator that the monitor has been turned on.  Do not shower for the first 24 hours. You may shower after the first 24 hours.  Press the button if you feel a symptom. You will hear a small  click. Record Date, Time and  Symptom in the Patient Logbook.  When you are ready to remove the patch, follow instructions on the last 2 pages of Patient  Logbook. Stick patch monitor onto the last page of Patient Logbook.  Place Patient Logbook in the blue and white box. Use locking tab on box and tape box closed  securely. The blue and white box has prepaid postage on it. Please place it in the mailbox as  soon as possible. Your physician should have your test results approximately 7 days after the  monitor has been mailed back to [facility].  Call [facility] Customer Care at [phone] if you have questions regarding  your ZIO XT patch monitor. Call them immediately if you see an orange light blinking on your  monitor.  If your monitor falls off in less than 4 days, contact our Monitor department at [phone].  If your monitor becomes loose or falls off after 4 days call [employer] at [phone] for  suggestions on securing your monitor    Follow-Up: At [facility], you and your health needs are our priority.  As part of our continuing mission to provide you with exceptional heart care, we have created designated Provider Care Teams.  These Care Teams include your primary Cardiologist (physician) and Advanced Practice Providers (APPs -  Physician Assistants and Nurse Practitioners) who all work together to provide you with the care you need, when you need it.   Your next appointment:   6 -8 week(s)  Provider:   Thomasene Ripple, DO [address], [place], [place] [address]

## 2023-07-17 NOTE — Progress Notes (Unsigned)
Cardiology Office Note:    Date:  07/17/2023   ID:  Sandy Carter, DOB 01-Mar-2001, MRN [id number]  PCP:  [facility]  Cardiologist:  None  Electrophysiologist:  None   Referring MD: [name], CNM   No chief complaint on file. ***  History of Present Illness:    Sandy Carter is a 22 y.o. female with a hx of ***  Past Medical History:  Diagnosis Date   ADD (attention deficit disorder)     in her teens   Anxiety    Depression    Dysrhythmia    palpitations - from Hyperthyroidism   Headache    Hyperthyroidism    not treated at this time   Lumbar herniated disc    ODD (oppositional defiant disorder)    in her teens   PONV (postoperative nausea and vomiting)    Seizures (HCC)    shakes alot, is aware of what is going on, feels them coming on, has not seen a neurologist   Tuberculosis    before age 24    Past Surgical History:  Procedure Laterality Date   CESAREAN SECTION N/A 10/19/2021   Procedure: CESAREAN SECTION;  Surgeon: Myna Hidalgo, DO;  Location: [facility] LD ORS;  Service: Obstetrics;  Laterality: N/A;   COLONOSCOPY WITH PROPOFOL N/A 09/17/2020   Procedure: COLONOSCOPY WITH PROPOFOL;  Surgeon: Lanelle Bal, DO;  Location: [facility] ENDO SUITE;  Service: Endoscopy;  Laterality: N/A;  2:00pm   LAPAROSCOPIC APPENDECTOMY N/A 10/17/2021   Procedure: APPENDECTOMY LAPAROSCOPIC;  Surgeon: Andria Meuse, MD;  Location: [facility] OR;  Service: General;  Laterality: N/A;   LUMBAR LAMINECTOMY/DECOMPRESSION MICRODISCECTOMY Right 12/28/2020   Procedure: Right Lumbar Five Sacral One Microdiscectomy;  Surgeon: Coletta Memos, MD;  Location: [facility] OR;  Service: Neurosurgery;  Laterality: Right;   OPEN REDUCTION INTERNAL FIXATION (ORIF) METACARPAL Right 12/08/2022   Procedure: OPEN REDUCTION INTERNAL FIXATION (ORIF) METACARPAL;  Surgeon: Oliver Barre, MD;  Location: [facility] ORS;  Service: Orthopedics;  Laterality: Right;   WISDOM TOOTH EXTRACTION      Current  Medications: Current Meds  Medication Sig   Blood Pressure Monitor MISC For regular home bp monitoring during pregnancy   escitalopram (LEXAPRO) 10 MG tablet 1 tablet daily   nitrofurantoin, macrocrystal-monohydrate, (MACROBID) 100 MG capsule Take 1 capsule (100 mg total) by mouth at bedtime. Begin this treatment after you complete your course of Bactrim and take one each night until delivery   Prenatal Vit-Fe Fumarate-FA (PRENATAL VITAMIN PO) Take by mouth.     Allergies:   Depo-provera [medroxyprogesterone acetate], Penicillins, Gabapentin, Other, and Penicillin g   Social History   Socioeconomic History   Marital status: Married    Spouse name: Not on file   Number of children: Not on file   Years of education: Not on file   Highest education level: Not on file  Occupational History   Not on file  Tobacco Use   Smoking status: Former    Types: Cigarettes   Smokeless tobacco: Never  Vaping Use   Vaping status: Former  Substance and Sexual Activity   Alcohol use: Not Currently    Comment: occ   Drug use: Yes    Types: Marijuana    Comment: occasionaly, currently stopped   Sexual activity: Yes    Birth control/protection: None  Other Topics Concern   Not on file  Social History Narrative   Not on file   Social Determinants of Health  Financial Resource Strain: Patient Declined (04/14/2023)   Overall Financial Resource Strain (CARDIA)    Difficulty of Paying Living Expenses: Patient declined  Food Insecurity: No Food Insecurity (04/14/2023)   Hunger Vital Sign    Worried About Running Out of Food in the Last Year: Never true    Ran Out of Food in the Last Year: Never true  Transportation Needs: No Transportation Needs (04/14/2023)   PRAPARE - Administrator, Civil Service (Medical): No    Lack of Transportation (Non-Medical): No  Physical Activity: Insufficiently Active (04/14/2023)   Exercise Vital Sign    Days of Exercise per Week: 1 day    Minutes of  Exercise per Session: 20 min  Stress: Stress Concern Present (04/14/2023)   [employer] of Occupational Health - Occupational Stress Questionnaire    Feeling of Stress : Very much  Social Connections: Moderately Isolated (04/14/2023)   Social Connection and Isolation Panel [NHANES]    Frequency of Communication with Friends and Family: More than three times a week    Frequency of Social Gatherings with Friends and Family: More than three times a week    Attends Religious Services: Never    Database administrator or Organizations: No    Attends Engineer, structural: Never    Marital Status: Married     Family History: The patient's family history includes Alcoholism in her mother; Bone cancer in her mother; Diabetes in her maternal grandmother. She was adopted.  ROS:   Review of Systems  Constitution: Negative for decreased appetite, fever and weight gain.  HENT: Negative for congestion, ear discharge, hoarse voice and sore throat.   Eyes: Negative for discharge, redness, vision loss in right eye and visual halos.  Cardiovascular: Negative for chest pain, dyspnea on exertion, leg swelling, orthopnea and palpitations.  Respiratory: Negative for cough, hemoptysis, shortness of breath and snoring.   Endocrine: Negative for heat intolerance and polyphagia.  Hematologic/Lymphatic: Negative for bleeding problem. Does not bruise/bleed easily.  Skin: Negative for flushing, nail changes, rash and suspicious lesions.  Musculoskeletal: Negative for arthritis, joint pain, muscle cramps, myalgias, neck pain and stiffness.  Gastrointestinal: Negative for abdominal pain, bowel incontinence, diarrhea and excessive appetite.  Genitourinary: Negative for decreased libido, genital sores and incomplete emptying.  Neurological: Negative for brief paralysis, focal weakness, headaches and loss of balance.  Psychiatric/Behavioral: Negative for altered mental status, depression and suicidal ideas.   Allergic/Immunologic: Negative for HIV exposure and persistent infections.    EKGs/Labs/Other Studies Reviewed:    The following studies were reviewed today:   EKG:  The ekg ordered today demonstrates   Recent Labs: 05/31/2023: ALT 9; BUN 9; Creatinine, Ser 0.44; Hemoglobin 12.2; Platelets 276; Potassium 3.5; Sodium 135; TSH 1.485  Recent Lipid Panel    Component Value Date/Time   CHOL 112 02/12/2016 0630   TRIG 88 02/12/2016 0630   HDL 39 (L) 02/12/2016 0630   CHOLHDL 2.9 02/12/2016 0630   VLDL 18 02/12/2016 0630   LDLCALC 55 02/12/2016 0630    Physical Exam:    VS:  BP 110/62 (BP Location: Left Arm, Patient Position: Sitting, Cuff Size: Normal)   Ht 5\' 2"  (1.575 m)   Wt 129 lb (58.5 kg)   BMI 23.59 kg/m     Wt Readings from Last 3 Encounters:  07/17/23 129 lb (58.5 kg)  06/24/23 123 lb (55.8 kg)  05/31/23 116 lb (52.6 kg)     GEN: Well nourished, well developed in no acute  distress HEENT: Normal NECK: No JVD; No carotid bruits LYMPHATICS: No lymphadenopathy CARDIAC: S1S2 noted,RRR, no murmurs, rubs, gallops RESPIRATORY:  Clear to auscultation without rales, wheezing or rhonchi  ABDOMEN: Soft, non-tender, non-distended, +bowel sounds, no guarding. EXTREMITIES: No edema, No cyanosis, no clubbing MUSCULOSKELETAL:  No deformity  SKIN: Warm and dry NEUROLOGIC:  Alert and oriented x 3, non-focal PSYCHIATRIC:  Normal affect, good insight  ASSESSMENT:    No diagnosis found. PLAN:     1.  The patient is in agreement with the above plan. The patient left the office in stable condition.  The patient will follow up in   Medication Adjustments/Labs and Tests Ordered: Current medicines are reviewed at length with the patient today.  Concerns regarding medicines are outlined above.  No orders of the defined types were placed in this encounter.  No orders of the defined types were placed in this encounter.   There are no Patient Instructions on file for this  visit.   Adopting a Healthy Lifestyle.  Know what a healthy weight is for you (roughly BMI <25) and aim to maintain this   Aim for 7+ servings of fruits and vegetables daily   65-80+ fluid ounces of water or unsweet tea for healthy kidneys   Limit to max 1 drink of alcohol per day; avoid smoking/tobacco   Limit animal fats in diet for cholesterol and heart health - choose grass fed whenever available   Avoid highly processed foods, and foods high in saturated/trans fats   Aim for low stress - take time to unwind and care for your mental health   Aim for 150 min of moderate intensity exercise weekly for heart health, and weights twice weekly for bone health   Aim for 7-9 hours of sleep daily   When it comes to diets, agreement about the perfect plan isnt easy to find, even among the experts. Experts at the [facility] [employer] developed an idea known as the Healthy Eating Plate. Just imagine a plate divided into logical, healthy portions.   The emphasis is on diet quality:   Load up on vegetables and fruits - one-half of your plate: Aim for color and variety, and remember that potatoes dont count.   Go for whole grains - one-quarter of your plate: Whole wheat, barley, wheat berries, quinoa, oats, brown rice, and foods made with them. If you want pasta, go with whole wheat pasta.   Protein power - one-quarter of your plate: Fish, chicken, beans, and nuts are all healthy, versatile protein sources. Limit red meat.   The diet, however, does go beyond the plate, offering a few other suggestions.   Use healthy plant oils, such as olive, canola, soy, corn, sunflower and peanut. Check the labels, and avoid partially hydrogenated oil, which have unhealthy trans fats.   If youre thirsty, drink water. Coffee and tea are good in moderation, but skip sugary drinks and limit milk and dairy products to one or two daily servings.   The type of carbohydrate in the diet is more  important than the amount. Some sources of carbohydrates, such as vegetables, fruits, whole grains, and beans-are healthier than others.   Finally, stay active  Signed, Thomasene Ripple, DO  07/17/2023 4:38 PM    [facility]

## 2023-07-17 NOTE — Progress Notes (Unsigned)
Enrolled patient for a 7 day Zio XT monitor to be mailed to patients home.  

## 2023-07-18 ENCOUNTER — Encounter: Payer: Self-pay | Admitting: Women's Health

## 2023-07-22 ENCOUNTER — Encounter: Payer: Self-pay | Admitting: Women's Health

## 2023-07-22 ENCOUNTER — Ambulatory Visit (INDEPENDENT_AMBULATORY_CARE_PROVIDER_SITE_OTHER): Payer: MEDICAID | Admitting: Women's Health

## 2023-07-22 ENCOUNTER — Other Ambulatory Visit: Payer: MEDICAID

## 2023-07-22 VITALS — BP 117/68 | HR 91 | Wt 131.0 lb

## 2023-07-22 DIAGNOSIS — Z3482 Encounter for supervision of other normal pregnancy, second trimester: Secondary | ICD-10-CM | POA: Diagnosis not present

## 2023-07-22 DIAGNOSIS — Z23 Encounter for immunization: Secondary | ICD-10-CM | POA: Diagnosis not present

## 2023-07-22 DIAGNOSIS — Z3A27 27 weeks gestation of pregnancy: Secondary | ICD-10-CM

## 2023-07-22 DIAGNOSIS — Z131 Encounter for screening for diabetes mellitus: Secondary | ICD-10-CM

## 2023-07-22 DIAGNOSIS — Z8639 Personal history of other endocrine, nutritional and metabolic disease: Secondary | ICD-10-CM | POA: Diagnosis not present

## 2023-07-22 DIAGNOSIS — N390 Urinary tract infection, site not specified: Secondary | ICD-10-CM | POA: Diagnosis not present

## 2023-07-22 NOTE — Progress Notes (Signed)
LOW-RISK PREGNANCY VISIT Patient name: Sandy Carter MRN [id number]  Date of birth: 08-29-2001 Chief Complaint:   Routine Prenatal Visit (PN2 Lady Gary today)  History of Present Illness:   MADELAINE Carter is a 22 y.o. G84P1011 female at [redacted]w[redacted]d with an Estimated Date of Delivery: 10/16/23 being seen today for ongoing management of a low-risk pregnancy.   Today she reports  contractions other day. Bleeding after sex x 1 last week. Increased white d/c, no itching/odor/irritation- declines exam or self swab . Contractions: Not present.  .  Movement: Present. denies leaking of fluid.     07/22/2023    8:50 AM 04/14/2023    2:24 PM 08/16/2021    9:48 AM 05/01/2021    3:16 PM 03/13/2021    1:54 PM  Depression screen PHQ 2/9  Decreased Interest 0 3 0 0 1  Down, Depressed, Hopeless 1 3 1  0 2  PHQ - 2 Score 1 6 1  0 3  Altered sleeping 2 2 1 3 3   Tired, decreased energy 2 3 2 3 3   Change in appetite 0 2 2 0 0  Feeling bad or failure about yourself  0 2 0 0 0  Trouble concentrating 2 3 2 3  0  Moving slowly or fidgety/restless 0 0 0 0 0  Suicidal thoughts 0 0 0 0 0  PHQ-9 Score 7 18 8 9 9         04/14/2023    2:24 PM 08/16/2021    9:49 AM 05/01/2021    3:17 PM 03/13/2021    1:54 PM  GAD 7 : Generalized Anxiety Score  Nervous, Anxious, on Edge 3 2 3 2   Control/stop worrying 3 1 3 3   Worry too much - different things 3 3 2 2   Trouble relaxing 3 1 3  0  Restless 1 0 0 0  Easily annoyed or irritable 3 1 1 2   Afraid - awful might happen 2 0 1 0  Total GAD 7 Score 18 8 13 9       Review of Systems:   Pertinent items are noted in HPI Denies abnormal vaginal discharge w/ itching/odor/irritation, headaches, visual changes, shortness of breath, chest pain, abdominal pain, severe nausea/vomiting, or problems with urination or bowel movements unless otherwise stated above. Pertinent History Reviewed:  Reviewed past medical,surgical, social, obstetrical and family history.  Reviewed problem list,  medications and allergies. Physical Assessment:   Vitals:   07/22/23 0847  BP: 117/68  Pulse: 91  Weight: 131 lb (59.4 kg)  Body mass index is 23.96 kg/m.        Physical Examination:   General appearance: Well appearing, and in no distress  Mental status: Alert, oriented to person, place, and time  Skin: Warm & dry  Cardiovascular: Normal heart rate noted  Respiratory: Normal respiratory effort, no distress  Abdomen: Soft, gravid, nontender  Pelvic: Cervical exam deferred         Extremities: Edema: None  Fetal Status: Fetal Heart Rate (bpm): 142 Fundal Height: 26 cm Movement: Present    Chaperone: N/A   No results found for this or any previous visit (from the past 24 hour(s)).  Assessment & Plan:  1) Low-risk pregnancy G3P1011 at [redacted]w[redacted]d with an Estimated Date of Delivery: 10/16/23   2) Prev c/s, wants RCS  3) H/O FGR> plan EFW 36w unless indicated earlier  4) H/O recurrent ASB> urine cx poc today, continue macrobid suppression  5) H/O hyperthyroid> no meds, last TSH 1.485 on 9/22  6) Palpitations/chest pain> being followed by OB cards, has echo 12/4   Meds: No orders of the defined types were placed in this encounter.  Labs/procedures today: flu shot, tdap, urine culture, and PN2  Plan:  Continue routine obstetrical care  Next visit: prefers in person    Reviewed: Preterm labor symptoms and general obstetric precautions including but not limited to vaginal bleeding, contractions, leaking of fluid and fetal movement were reviewed in detail with the patient.  All questions were answered. Does have home bp cuff. Office bp cuff given: not applicable. Check bp weekly, let us know if consistently >140 and/or >90.  Follow-up: Return in about 3 weeks (around 08/12/2023) for LROB, CNM, in person.  Future Appointments  Date Time Provider Department Center  08/12/2023  2:50 PM [facility] ECHO 2 [id number]    Orders Placed This Encounter  Procedures   Urine  Culture   Tdap vaccine greater than or equal to 7yo IM   Cheral Marker CNM, [facility] 07/22/2023 9:56 AM

## 2023-07-22 NOTE — Patient Instructions (Signed)
Sandy Carter, thank you for choosing our office today! We appreciate the opportunity to meet your healthcare needs. You may receive a short survey by mail, e-mail, or through [employer]. If you are happy with your care we would appreciate if you could take just a few minutes to complete the survey questions. We read all of your comments and take your feedback very seriously. Thank you again for choosing our office.  Center for [employer] Team at [facility]  [facility] at [facility] ([address] [place], [place] [address]) Entrance C, located off of [address] Free 24/7 valet parking   CLASSES: Go to [email or web address] to register for classes (childbirth, breastfeeding, waterbirth, infant CPR, daddy bootcamp, etc.)  Call the office [phone]) or go to [facility] if: You begin to have strong, frequent contractions Your water breaks.  Sometimes it is a big gush of fluid, sometimes it is just a trickle that keeps getting your panties wet or running down your legs You have vaginal bleeding.  It is normal to have a small amount of spotting if your cervix was checked.  You don't feel your baby moving like normal.  If you don't, get you something to eat and drink and lay down and focus on feeling your baby move.   If your baby is still not moving like normal, you should call the office or go to [facility].  Call the office [phone]) or go to [facility] hospital for these signs of pre-eclampsia: Severe headache that does not go away with Tylenol Visual changes- seeing spots, double, blurred vision Pain under your right breast or upper abdomen that does not go away with Tums or heartburn medicine Nausea and/or vomiting Severe swelling in your hands, feet, and face   Tdap Vaccine It is recommended that you get the Tdap vaccine during the third trimester of EACH pregnancy to help protect your baby from getting pertussis (whooping cough) 27-36 weeks is the BEST time to do  this so that you can pass the protection on to your baby. During pregnancy is better than after pregnancy, but if you are unable to get it during pregnancy it will be offered at the hospital.  You can get this vaccine with [place], at the health department, your family doctor, or some local pharmacies Everyone who will be around your baby should also be up-to-date on their vaccines before the baby comes. Adults (who are not pregnant) only need 1 dose of Tdap during adulthood.   [facility] Pediatricians/Family Doctors [facility]): [address] Colette Ribas, [phone]           [facility]: [address], [phone]                [facility]): [address], [phone] (call to ask if accepting patients) [facility] Department: [address], [place], [phone]    [facility] Pediatricians/Family Doctors [facility]): [phone] [address], [phone] [facility]: [address] [place], [phone] [facility]: [address], [phone]  [facility] Doctors  [facility]): [phone] [facility]: [address], [phone]   [facility] Doctors [facility]: [address], [phone]  [facility] Family Doctors  [facility]: [phone], [phone]  Home Blood Pressure Monitoring for Patients   Your provider has recommended that you check your  blood pressure (BP) at least once a week at home. If you do not have a blood pressure cuff at home, one will be provided for you. Contact your provider if you have not received your monitor within 1 week.   Helpful Tips for Accurate Home Blood Pressure Checks  Don't smoke, exercise, or drink caffeine 30 minutes before checking your BP Use the restroom before checking your BP (a full bladder can raise your  pressure) Relax in a comfortable upright chair Feet on the ground Left arm resting comfortably on a flat surface at the level of your heart Legs uncrossed Back supported Sit quietly and don't talk Place the cuff on your bare arm Adjust snuggly, so that only two fingertips can fit between your skin and the top of the cuff Check 2 readings separated by at least one minute Keep a log of your BP readings For a visual, please reference this diagram: http://ccnc.care/bpdiagram  Provider Name: [facility]     Phone: [phone]  Zone 1: ALL CLEAR  Continue to monitor your symptoms:  BP reading is less than 140 (top number) or less than 90 (bottom number)  No right upper stomach pain No headaches or seeing spots No feeling nauseated or throwing up No swelling in face and hands  Zone 2: CAUTION Call your doctor's office for any of the following:  BP reading is greater than 140 (top number) or greater than 90 (bottom number)  Stomach pain under your ribs in the middle or right side Headaches or seeing spots Feeling nauseated or throwing up Swelling in face and hands  Zone 3: EMERGENCY  Seek immediate medical care if you have any of the following:  BP reading is greater than160 (top number) or greater than 110 (bottom number) Severe headaches not improving with Tylenol Serious difficulty catching your breath Any worsening symptoms from Zone 2   Third Trimester of Pregnancy The third trimester is from week 29 through week 42, months 7 through 9. The third trimester is a time when the fetus is growing rapidly. At the end of the ninth month, the fetus is about 20 inches in length and weighs 6-10 pounds.  BODY CHANGES Your body goes through many changes during pregnancy. The changes vary from woman to woman.  Your weight will continue to increase. You can expect to gain 25-35 pounds (11-16 kg) by the end of the pregnancy. You may begin to get stretch marks on your hips, abdomen,  and breasts. You may urinate more often because the fetus is moving lower into your pelvis and pressing on your bladder. You may develop or continue to have heartburn as a result of your pregnancy. You may develop constipation because certain hormones are causing the muscles that push waste through your intestines to slow down. You may develop hemorrhoids or swollen, bulging veins (varicose veins). You may have pelvic pain because of the weight gain and pregnancy hormones relaxing your joints between the bones in your pelvis. Backaches may result from overexertion of the muscles supporting your posture. You may have changes in your hair. These can include thickening of your hair, rapid growth, and changes in texture. Some women also have hair loss during or after pregnancy, or hair that feels dry or thin. Your hair will most likely return to normal after your baby is born. Your breasts will continue to grow and be tender. A yellow discharge may leak from your breasts called colostrum. Your belly button may stick out. You may   feel short of breath because of your expanding uterus. You may notice the fetus "dropping," or moving lower in your abdomen. You may have a bloody mucus discharge. This usually occurs a few days to a week before labor begins. Your cervix becomes thin and soft (effaced) near your due date. WHAT TO EXPECT AT YOUR PRENATAL EXAMS  You will have prenatal exams every 2 weeks until week 36. Then, you will have weekly prenatal exams. During a routine prenatal visit: You will be weighed to make sure you and the fetus are growing normally. Your blood pressure is taken. Your abdomen will be measured to track your baby's growth. The fetal heartbeat will be listened to. Any test results from the previous visit will be discussed. You may have a cervical check near your due date to see if you have effaced. At around 36 weeks, your caregiver will check your cervix. At the same time, your  caregiver will also perform a test on the secretions of the vaginal tissue. This test is to determine if a type of bacteria, Group B streptococcus, is present. Your caregiver will explain this further. Your caregiver may ask you: What your birth plan is. How you are feeling. If you are feeling the baby move. If you have had any abnormal symptoms, such as leaking fluid, bleeding, severe headaches, or abdominal cramping. If you have any questions. Other tests or screenings that may be performed during your third trimester include: Blood tests that check for low iron levels (anemia). Fetal testing to check the health, activity level, and growth of the fetus. Testing is done if you have certain medical conditions or if there are problems during the pregnancy. FALSE LABOR You may feel small, irregular contractions that eventually go away. These are called Braxton Hicks contractions, or false labor. Contractions may last for hours, days, or even weeks before true labor sets in. If contractions come at regular intervals, intensify, or become painful, it is best to be seen by your caregiver.  SIGNS OF LABOR  Menstrual-like cramps. Contractions that are 5 minutes apart or less. Contractions that start on the top of the uterus and spread down to the lower abdomen and back. A sense of increased pelvic pressure or back pain. A watery or bloody mucus discharge that comes from the vagina. If you have any of these signs before the 37th week of pregnancy, call your caregiver right away. You need to go to the hospital to get checked immediately. HOME CARE INSTRUCTIONS  Avoid all smoking, herbs, alcohol, and unprescribed drugs. These chemicals affect the formation and growth of the baby. Follow your caregiver's instructions regarding medicine use. There are medicines that are either safe or unsafe to take during pregnancy. Exercise only as directed by your caregiver. Experiencing uterine cramps is a good sign to  stop exercising. Continue to eat regular, healthy meals. Wear a good support bra for breast tenderness. Do not use hot tubs, steam rooms, or saunas. Wear your seat belt at all times when driving. Avoid raw meat, uncooked cheese, cat litter boxes, and soil used by cats. These carry germs that can cause birth defects in the baby. Take your prenatal vitamins. Try taking a stool softener (if your caregiver approves) if you develop constipation. Eat more high-fiber foods, such as fresh vegetables or fruit and whole grains. Drink plenty of fluids to keep your urine clear or pale yellow. Take warm sitz baths to soothe any pain or discomfort caused by hemorrhoids. Use hemorrhoid cream if   your caregiver approves. If you develop varicose veins, wear support hose. Elevate your feet for 15 minutes, 3-4 times a day. Limit salt in your diet. Avoid heavy lifting, wear low heal shoes, and practice good posture. Rest a lot with your legs elevated if you have leg cramps or low back pain. Visit your dentist if you have not gone during your pregnancy. Use a soft toothbrush to brush your teeth and be gentle when you floss. A sexual relationship may be continued unless your caregiver directs you otherwise. Do not travel far distances unless it is absolutely necessary and only with the approval of your caregiver. Take prenatal classes to understand, practice, and ask questions about the labor and delivery. Make a trial run to the hospital. Pack your hospital bag. Prepare the baby's nursery. Continue to go to all your prenatal visits as directed by your caregiver. SEEK MEDICAL CARE IF: You are unsure if you are in labor or if your water has broken. You have dizziness. You have mild pelvic cramps, pelvic pressure, or nagging pain in your abdominal area. You have persistent nausea, vomiting, or diarrhea. You have a bad smelling vaginal discharge. You have pain with urination. SEEK IMMEDIATE MEDICAL CARE IF:  You  have a fever. You are leaking fluid from your vagina. You have spotting or bleeding from your vagina. You have severe abdominal cramping or pain. You have rapid weight loss or gain. You have shortness of breath with chest pain. You notice sudden or extreme swelling of your face, hands, ankles, feet, or legs. You have not felt your baby move in over an hour. You have severe headaches that do not go away with medicine. You have vision changes. Document Released: 08/19/2001 Document Revised: 08/30/2013 Document Reviewed: 10/26/2012 ExitCare Patient Information 2015 [employer]. This information is not intended to replace advice given to you by your health care provider. Make sure you discuss any questions you have with your health care provider.       

## 2023-07-23 ENCOUNTER — Other Ambulatory Visit: Payer: Self-pay | Admitting: Women's Health

## 2023-07-23 LAB — CBC
Hematocrit: 30 % — ABNORMAL LOW (ref 34.0–46.6)
Hemoglobin: 10 g/dL — ABNORMAL LOW (ref 11.1–15.9)
MCH: 30.2 pg (ref 26.6–33.0)
MCHC: 33.3 g/dL (ref 31.5–35.7)
MCV: 91 fL (ref 79–97)
Platelets: 270 10*3/uL (ref 150–450)
RBC: 3.31 x10E6/uL — ABNORMAL LOW (ref 3.77–5.28)
RDW: 11.9 % (ref 11.7–15.4)
WBC: 8.5 10*3/uL (ref 3.4–10.8)

## 2023-07-23 LAB — GLUCOSE TOLERANCE, 2 HOURS W/ 1HR
Glucose, 1 hour: 107 mg/dL (ref 70–179)
Glucose, 2 hour: 96 mg/dL (ref 70–152)
Glucose, Fasting: 78 mg/dL (ref 70–91)

## 2023-07-23 LAB — HIV ANTIBODY (ROUTINE TESTING W REFLEX): HIV Screen 4th Generation wRfx: NONREACTIVE

## 2023-07-23 LAB — RPR: RPR Ser Ql: NONREACTIVE

## 2023-07-23 LAB — ANTIBODY SCREEN: Antibody Screen: NEGATIVE

## 2023-07-23 MED ORDER — FERROUS SULFATE 325 (65 FE) MG PO TABS: 325.0000 mg | ORAL_TABLET | ORAL | 2 refills | Status: DC

## 2023-07-24 LAB — URINE CULTURE

## 2023-08-04 ENCOUNTER — Encounter: Payer: Self-pay | Admitting: Cardiology

## 2023-08-12 ENCOUNTER — Ambulatory Visit (HOSPITAL_COMMUNITY): Payer: MEDICAID | Attending: Cardiology

## 2023-08-12 ENCOUNTER — Encounter: Payer: Self-pay | Admitting: Advanced Practice Midwife

## 2023-08-12 ENCOUNTER — Ambulatory Visit: Payer: MEDICAID | Admitting: Advanced Practice Midwife

## 2023-08-12 VITALS — BP 95/67 | HR 103 | Wt 136.0 lb

## 2023-08-12 DIAGNOSIS — Z87898 Personal history of other specified conditions: Secondary | ICD-10-CM

## 2023-08-12 DIAGNOSIS — R079 Chest pain, unspecified: Secondary | ICD-10-CM

## 2023-08-12 DIAGNOSIS — Z3A30 30 weeks gestation of pregnancy: Secondary | ICD-10-CM

## 2023-08-12 DIAGNOSIS — R0609 Other forms of dyspnea: Secondary | ICD-10-CM | POA: Insufficient documentation

## 2023-08-12 DIAGNOSIS — F32A Depression, unspecified: Secondary | ICD-10-CM

## 2023-08-12 DIAGNOSIS — D649 Anemia, unspecified: Secondary | ICD-10-CM

## 2023-08-12 DIAGNOSIS — Z3481 Encounter for supervision of other normal pregnancy, first trimester: Secondary | ICD-10-CM

## 2023-08-12 DIAGNOSIS — O99343 Other mental disorders complicating pregnancy, third trimester: Secondary | ICD-10-CM

## 2023-08-12 DIAGNOSIS — F418 Other specified anxiety disorders: Secondary | ICD-10-CM

## 2023-08-12 DIAGNOSIS — O99013 Anemia complicating pregnancy, third trimester: Secondary | ICD-10-CM

## 2023-08-12 DIAGNOSIS — O99413 Diseases of the circulatory system complicating pregnancy, third trimester: Secondary | ICD-10-CM

## 2023-08-12 LAB — ECHOCARDIOGRAM COMPLETE
Area-P 1/2: 4.21 cm2
S' Lateral: 2.6 cm
Weight: 2176 [oz_av]

## 2023-08-12 MED ORDER — ESCITALOPRAM OXALATE 5 MG PO TABS: 5.0000 mg | ORAL_TABLET | Freq: Every day | ORAL | 3 refills | Status: DC

## 2023-08-12 NOTE — Progress Notes (Signed)
   LOW-RISK PREGNANCY VISIT Patient name: Sandy Carter MRN [id number]  Date of birth: 11/10/2000 Chief Complaint:   Routine Prenatal Visit  History of Present Illness:   Sandy Carter is a 22 y.o. G23P1011 female at [redacted]w[redacted]d with an Estimated Date of Delivery: 10/16/23 being seen today for ongoing management of a low-risk pregnancy.  Today she reports  doing well; has echo later today . Contractions: Irregular.  .  Movement: Present. denies leaking of fluid. Review of Systems:   Pertinent items are noted in HPI Denies abnormal vaginal discharge w/ itching/odor/irritation, headaches, visual changes, shortness of breath, chest pain, abdominal pain, severe nausea/vomiting, or problems with urination or bowel movements unless otherwise stated above. Pertinent History Reviewed:  Reviewed past medical,surgical, social, obstetrical and family history.  Reviewed problem list, medications and allergies. Physical Assessment:   Vitals:   08/12/23 1056  BP: 95/67  Pulse: (!) 103  Weight: 136 lb (61.7 kg)  Body mass index is 24.87 kg/m.        Physical Examination:   General appearance: Well appearing, and in no distress  Mental status: Alert, oriented to person, place, and time  Skin: Warm & dry  Cardiovascular: Normal heart rate noted  Respiratory: Normal respiratory effort, no distress  Abdomen: Soft, gravid, nontender  Pelvic: Cervical exam deferred         Extremities: Edema: Trace  Fetal Status: Fetal Heart Rate (bpm): 147 Fundal Height: 30 cm Movement: Present    No results found for this or any previous visit (from the past 24 hour(s)).  Assessment & Plan:  1) Low-risk pregnancy G3P1011 at [redacted]w[redacted]d with an Estimated Date of Delivery: 10/16/23   2) Palpitations/chest pain, has echo later today  3) Hx hyperthyroid, no meds, will get next TSH ~36wks  4) Worsening anx/dep, on Lexapro 10mg , will add 5mg  tabs  5) Hx C/S, still leaning towards rLTCS but not definitive  6) Anemia, will  check Hgb with TSH ~36wks   Meds:  Meds ordered this encounter  Medications   escitalopram (LEXAPRO) 5 MG tablet    Sig: Take 1 tablet (5 mg total) by mouth daily.    Dispense:  30 tablet    Refill:  3    Order Specific Question:   Supervising Provider    Answer:   Myna Hidalgo [[id number]]   Labs/procedures today: none  Plan:  Continue routine obstetrical care   Reviewed: Preterm labor symptoms and general obstetric precautions including but not limited to vaginal bleeding, contractions, leaking of fluid and fetal movement were reviewed in detail with the patient.  All questions were answered. Has home bp cuff. Check bp weekly, let us know if >140/90.   Follow-up: Return for 2wk LROB, 4wk LROB, 6wk LROB & EFW [place].  Orders Placed This Encounter  Procedures   US OB Follow Up   [name] [facility] 08/12/2023 11:22 AM

## 2023-08-17 ENCOUNTER — Encounter: Payer: Self-pay | Admitting: Women's Health

## 2023-08-18 ENCOUNTER — Encounter (HOSPITAL_COMMUNITY): Payer: Self-pay | Admitting: Emergency Medicine

## 2023-08-18 ENCOUNTER — Other Ambulatory Visit: Payer: Self-pay

## 2023-08-18 ENCOUNTER — Emergency Department (HOSPITAL_COMMUNITY)
Admission: EM | Admit: 2023-08-18 | Discharge: 2023-08-18 | Disposition: A | Discharge status: Home / Self Care | Payer: MEDICAID | Attending: Emergency Medicine | Admitting: Emergency Medicine

## 2023-08-18 DIAGNOSIS — O479 False labor, unspecified: Secondary | ICD-10-CM | POA: Diagnosis present

## 2023-08-18 DIAGNOSIS — Z3A31 31 weeks gestation of pregnancy: Secondary | ICD-10-CM | POA: Diagnosis not present

## 2023-08-18 NOTE — Discharge Instructions (Signed)
You were seen for your contractions in the emergency department. It is likely from false labor.   At home, please stay well hydrated.    Check your MyChart online for the results of any tests that had not resulted by the time you left the emergency department.   Follow-up with your primary OBGYN in 2-3 days regarding your visit.    Return immediately to the MAU if you experience any of the following: worsening contractions, or any other concerning symptoms.    Thank you for visiting our Emergency Department. It was a pleasure taking care of you today.

## 2023-08-18 NOTE — ED Notes (Signed)
ED Provider at bedside. 

## 2023-08-18 NOTE — ED Provider Notes (Signed)
[facility] AT [facility] Provider Note   CSN: [id number] Arrival date & time: 08/18/23  2015     History {Add pertinent medical, surgical, social history, OB history to HPI:1} Chief Complaint  Patient presents with   Back Pain    Sandy Carter is a 22 y.o. female.  22 year old female G3P1 at [redacted]w[redacted]d with hx of c-section who presents with contractions.  Patient reports for the past 3 days she has been having intermittent contractions.  Today started becoming more frequent and is concerned she is going into labor.  Says that they are space several minutes apart and will last for a minute or 2 at a time.  Feels similar to her prior pregnancy when she was about to give birth.  No loss of fluid.  Still is having good fetal movement.       Home Medications Prior to Admission medications   Medication Sig Start Date End Date Taking? Authorizing Provider  Blood Pressure Monitor MISC For regular home bp monitoring during pregnancy 05/01/21   Cresenzo-Dishmon, Scarlette Calico, CNM  escitalopram (LEXAPRO) 10 MG tablet 1 tablet daily 06/22/23   Lazaro Arms, MD  escitalopram (LEXAPRO) 5 MG tablet Take 1 tablet (5 mg total) by mouth daily. 08/12/23   [name], CNM  ferrous sulfate 325 (65 FE) MG tablet Take 1 tablet (325 mg total) by mouth every other day. 07/23/23   Cheral Marker, CNM  nitrofurantoin, macrocrystal-monohydrate, (MACROBID) 100 MG capsule Take 1 capsule (100 mg total) by mouth at bedtime. Begin this treatment after you complete your course of Bactrim and take one each night until delivery 05/29/23   [name], CNM  Prenatal Vit-Fe Fumarate-FA (PRENATAL VITAMIN PO) Take by mouth.    [provider]  PROAIR HFA 108 (90 Base) MCG/ACT inhaler Inhale 1 puff into the lungs every 6 (six) hours as needed for wheezing or shortness of breath. Patient not taking: Reported on 07/17/2023 09/18/22   Cresenzo-Dishmon, Scarlette Calico, CNM  ARIPiprazole  (ABILIFY) 5 MG tablet Take 1 tablet (5 mg total) by mouth 2 (two) times daily. 02/20/16 04/14/20  Denzil Magnuson, NP  etonogestrel (NEXPLANON) 68 MG IMPL implant 1 each by Subdermal route once. Implanted 02/05/16  04/14/20  [provider]  FLUoxetine (PROZAC) 40 MG capsule Take 1 capsule (40 mg total) by mouth daily. 02/20/16 04/14/20  Denzil Magnuson, NP  lamoTRIgine (LAMICTAL) 200 MG tablet Take 1 tablet (200 mg total) by mouth every evening. 02/20/16 04/14/20  Denzil Magnuson, NP  prazosin (MINIPRESS) 1 MG capsule Take 1 capsule (1 mg total) by mouth at bedtime. 02/20/16 04/14/20  Denzil Magnuson, NP      Allergies    Depo-provera [medroxyprogesterone acetate], Penicillins, Gabapentin, Other, and Penicillin g    Review of Systems   Review of Systems  Physical Exam Updated Vital Signs BP 120/82   Pulse 99   Temp 97.8 F (36.6 C) (Oral)   Resp 16   Ht 5\' 2"  (1.575 m)   Wt 62 kg   SpO2 99%   BMI 25.00 kg/m  Physical Exam Constitutional:      Appearance: Normal appearance.  Cardiovascular:     Rate and Rhythm: Normal rate and regular rhythm.     Pulses: Normal pulses.  Pulmonary:     Effort: Pulmonary effort is normal.  Abdominal:     Palpations: There is no mass.     Tenderness: There is no abdominal tenderness. There is no guarding.  Comments: Gravid abdomen  Genitourinary:    Comments: Sterile pelvic exam chaperoned by RN Maci.  Cervix is closed.  No loss of fluids appreciated. Neurological:     Mental Status: She is alert.     ED Results / Procedures / Treatments   Labs (all labs ordered are listed, but only abnormal results are displayed) Labs Reviewed - No data to display  EKG None  Radiology No results found.  Procedures Procedures  {Document cardiac monitor, telemetry assessment procedure when appropriate:1}  Medications Ordered in ED Medications - No data to display  ED Course/ Medical Decision Making/ [name] Clinical Course as of 08/18/23 2130   Tue Aug 18, 2023  2121 Dr Scharlene Gloss [facility]. Asks the patient hydrate. [RP]    Clinical Course User Index [RP] Rondel Baton, MD   {   Click here for ABCD2, HEART and other calculatorsREFRESH Note before signing :1}                              Medical Decision Making  ***  {Document critical care time when appropriate:1} {Document review of labs and clinical decision tools ie heart score, Chads2Vasc2 etc:1}  {Document your independent review of radiology images, and any outside records:1} {Document your discussion with family members, caretakers, and with consultants:1} {Document social determinants of health affecting pt's care:1} {Document your decision making why or why not admission, treatments were needed:1} Final Clinical Impression(s) / ED Diagnoses Final diagnoses:  None    Rx / DC Orders ED Discharge Orders     None

## 2023-08-18 NOTE — ED Notes (Signed)
RROB informed this RN that pt has been cleared for OB and can be taken off TOCO monitor.

## 2023-08-18 NOTE — ED Notes (Signed)
Spoke with RROB nurse Marissa, pt placed on TOCO monitor. RROB to call this RN back after they have at least 20 minutes of monitoring pt.

## 2023-08-18 NOTE — ED Triage Notes (Signed)
Pt c/o lower back pain and contractions intermittently x 3 days.

## 2023-08-18 NOTE — ED Notes (Signed)
Due date feb 7th. Lower back pain and contractions x3 days. No bleeding.

## 2023-08-18 NOTE — Progress Notes (Signed)
Received call from [facility] RN who advised patient G3P1, 31.4 weeks presented with back pain and on and off contractions x 3 days. Patient verbally denies any vaginal bleeding or leaking of fluid, confirms fetal movement.   2034- Patient placed on monitors. FHR 140. Patient verbalized contractions every 2-15 minutes. Patient verbalized pain 5/10.   2105- Spoke with OB attending who also reviewed FHM strip. Per provider patient needs to be checked for dilation.  2120- Spoke with [facility] RN who informed this RN that ED provider checked patient and she does not appear to be dilated.   2123- Spoke with OB attending who confirmed she spoke with ED provider and cleared patient from [facility].   2125- Spoke with [facility] RN and advised patient has been cleared from [facility] and can be removed from the monitors. RN verbalized understanding.   Lovenia Shuck, RN RROB

## 2023-08-26 ENCOUNTER — Ambulatory Visit (INDEPENDENT_AMBULATORY_CARE_PROVIDER_SITE_OTHER): Payer: MEDICAID | Admitting: Advanced Practice Midwife

## 2023-08-26 ENCOUNTER — Encounter: Payer: Self-pay | Admitting: Advanced Practice Midwife

## 2023-08-26 VITALS — BP 100/62 | HR 86 | Wt 138.0 lb

## 2023-08-26 DIAGNOSIS — Z3A32 32 weeks gestation of pregnancy: Secondary | ICD-10-CM

## 2023-08-26 DIAGNOSIS — O99013 Anemia complicating pregnancy, third trimester: Secondary | ICD-10-CM

## 2023-08-26 DIAGNOSIS — Z3481 Encounter for supervision of other normal pregnancy, first trimester: Secondary | ICD-10-CM

## 2023-08-26 DIAGNOSIS — O34219 Maternal care for unspecified type scar from previous cesarean delivery: Secondary | ICD-10-CM

## 2023-08-26 DIAGNOSIS — O212 Late vomiting of pregnancy: Secondary | ICD-10-CM

## 2023-08-26 DIAGNOSIS — D649 Anemia, unspecified: Secondary | ICD-10-CM

## 2023-08-26 MED ORDER — ESCITALOPRAM OXALATE 5 MG PO TABS: 5.0000 mg | ORAL_TABLET | Freq: Every day | ORAL | 3 refills | Status: DC

## 2023-08-26 NOTE — Patient Instructions (Signed)
Sandy Carter, thank you for choosing our office today! We appreciate the opportunity to meet your healthcare needs. You may receive a short survey by mail, e-mail, or through [employer]. If you are happy with your care we would appreciate if you could take just a few minutes to complete the survey questions. We read all of your comments and take your feedback very seriously. Thank you again for choosing our office.  Center for [employer] Team at [facility]  [facility] at [facility] ([address] [place], [place] [address]) Entrance C, located off of [address] Free 24/7 valet parking   CLASSES: Go to [email or web address] to register for classes (childbirth, breastfeeding, waterbirth, infant CPR, daddy bootcamp, etc.)  Call the office [phone]) or go to [facility] if: You begin to have strong, frequent contractions Your water breaks.  Sometimes it is a big gush of fluid, sometimes it is just a trickle that keeps getting your panties wet or running down your legs You have vaginal bleeding.  It is normal to have a small amount of spotting if your cervix was checked.  You don't feel your baby moving like normal.  If you don't, get you something to eat and drink and lay down and focus on feeling your baby move.   If your baby is still not moving like normal, you should call the office or go to [facility].  Call the office [phone]) or go to [facility] hospital for these signs of pre-eclampsia: Severe headache that does not go away with Tylenol Visual changes- seeing spots, double, blurred vision Pain under your right breast or upper abdomen that does not go away with Tums or heartburn medicine Nausea and/or vomiting Severe swelling in your hands, feet, and face   Tdap Vaccine It is recommended that you get the Tdap vaccine during the third trimester of EACH pregnancy to help protect your baby from getting pertussis (whooping cough) 27-36 weeks is the BEST time to do  this so that you can pass the protection on to your baby. During pregnancy is better than after pregnancy, but if you are unable to get it during pregnancy it will be offered at the hospital.  You can get this vaccine with [place], at the health department, your family doctor, or some local pharmacies Everyone who will be around your baby should also be up-to-date on their vaccines before the baby comes. Adults (who are not pregnant) only need 1 dose of Tdap during adulthood.   [facility] Pediatricians/Family Doctors [facility]): [address] Colette Ribas, [phone]           [facility]: [address], [phone]                [facility]): [address], [phone] (call to ask if accepting patients) [facility] Department: [address], [place], [phone]    [facility] Pediatricians/Family Doctors [facility]): [phone] [address], [phone] [facility]: [address] [place], [phone] [facility]: [address], [phone]  [facility] Doctors  [facility]): [phone] [facility]: [address], [phone]   [facility] Doctors [facility]: [address], [phone]  [facility] Family Doctors  [facility]: [phone], [phone]  Home Blood Pressure Monitoring for Patients   Your provider has recommended that you check your  blood pressure (BP) at least once a week at home. If you do not have a blood pressure cuff at home, one will be provided for you. Contact your provider if you have not received your monitor within 1 week.   Helpful Tips for Accurate Home Blood Pressure Checks  Don't smoke, exercise, or drink caffeine 30 minutes before checking your BP Use the restroom before checking your BP (a full bladder can raise your  pressure) Relax in a comfortable upright chair Feet on the ground Left arm resting comfortably on a flat surface at the level of your heart Legs uncrossed Back supported Sit quietly and don't talk Place the cuff on your bare arm Adjust snuggly, so that only two fingertips can fit between your skin and the top of the cuff Check 2 readings separated by at least one minute Keep a log of your BP readings For a visual, please reference this diagram: http://ccnc.care/bpdiagram  Provider Name: [facility]     Phone: [phone]  Zone 1: ALL CLEAR  Continue to monitor your symptoms:  BP reading is less than 140 (top number) or less than 90 (bottom number)  No right upper stomach pain No headaches or seeing spots No feeling nauseated or throwing up No swelling in face and hands  Zone 2: CAUTION Call your doctor's office for any of the following:  BP reading is greater than 140 (top number) or greater than 90 (bottom number)  Stomach pain under your ribs in the middle or right side Headaches or seeing spots Feeling nauseated or throwing up Swelling in face and hands  Zone 3: EMERGENCY  Seek immediate medical care if you have any of the following:  BP reading is greater than160 (top number) or greater than 110 (bottom number) Severe headaches not improving with Tylenol Serious difficulty catching your breath Any worsening symptoms from Zone 2   Third Trimester of Pregnancy The third trimester is from week 29 through week 42, months 7 through 9. The third trimester is a time when the fetus is growing rapidly. At the end of the ninth month, the fetus is about 20 inches in length and weighs 6-10 pounds.  BODY CHANGES Your body goes through many changes during pregnancy. The changes vary from woman to woman.  Your weight will continue to increase. You can expect to gain 25-35 pounds (11-16 kg) by the end of the pregnancy. You may begin to get stretch marks on your hips, abdomen,  and breasts. You may urinate more often because the fetus is moving lower into your pelvis and pressing on your bladder. You may develop or continue to have heartburn as a result of your pregnancy. You may develop constipation because certain hormones are causing the muscles that push waste through your intestines to slow down. You may develop hemorrhoids or swollen, bulging veins (varicose veins). You may have pelvic pain because of the weight gain and pregnancy hormones relaxing your joints between the bones in your pelvis. Backaches may result from overexertion of the muscles supporting your posture. You may have changes in your hair. These can include thickening of your hair, rapid growth, and changes in texture. Some women also have hair loss during or after pregnancy, or hair that feels dry or thin. Your hair will most likely return to normal after your baby is born. Your breasts will continue to grow and be tender. A yellow discharge may leak from your breasts called colostrum. Your belly button may stick out. You may   feel short of breath because of your expanding uterus. You may notice the fetus "dropping," or moving lower in your abdomen. You may have a bloody mucus discharge. This usually occurs a few days to a week before labor begins. Your cervix becomes thin and soft (effaced) near your due date. WHAT TO EXPECT AT YOUR PRENATAL EXAMS  You will have prenatal exams every 2 weeks until week 36. Then, you will have weekly prenatal exams. During a routine prenatal visit: You will be weighed to make sure you and the fetus are growing normally. Your blood pressure is taken. Your abdomen will be measured to track your baby's growth. The fetal heartbeat will be listened to. Any test results from the previous visit will be discussed. You may have a cervical check near your due date to see if you have effaced. At around 36 weeks, your caregiver will check your cervix. At the same time, your  caregiver will also perform a test on the secretions of the vaginal tissue. This test is to determine if a type of bacteria, Group B streptococcus, is present. Your caregiver will explain this further. Your caregiver may ask you: What your birth plan is. How you are feeling. If you are feeling the baby move. If you have had any abnormal symptoms, such as leaking fluid, bleeding, severe headaches, or abdominal cramping. If you have any questions. Other tests or screenings that may be performed during your third trimester include: Blood tests that check for low iron levels (anemia). Fetal testing to check the health, activity level, and growth of the fetus. Testing is done if you have certain medical conditions or if there are problems during the pregnancy. FALSE LABOR You may feel small, irregular contractions that eventually go away. These are called Braxton Hicks contractions, or false labor. Contractions may last for hours, days, or even weeks before true labor sets in. If contractions come at regular intervals, intensify, or become painful, it is best to be seen by your caregiver.  SIGNS OF LABOR  Menstrual-like cramps. Contractions that are 5 minutes apart or less. Contractions that start on the top of the uterus and spread down to the lower abdomen and back. A sense of increased pelvic pressure or back pain. A watery or bloody mucus discharge that comes from the vagina. If you have any of these signs before the 37th week of pregnancy, call your caregiver right away. You need to go to the hospital to get checked immediately. HOME CARE INSTRUCTIONS  Avoid all smoking, herbs, alcohol, and unprescribed drugs. These chemicals affect the formation and growth of the baby. Follow your caregiver's instructions regarding medicine use. There are medicines that are either safe or unsafe to take during pregnancy. Exercise only as directed by your caregiver. Experiencing uterine cramps is a good sign to  stop exercising. Continue to eat regular, healthy meals. Wear a good support bra for breast tenderness. Do not use hot tubs, steam rooms, or saunas. Wear your seat belt at all times when driving. Avoid raw meat, uncooked cheese, cat litter boxes, and soil used by cats. These carry germs that can cause birth defects in the baby. Take your prenatal vitamins. Try taking a stool softener (if your caregiver approves) if you develop constipation. Eat more high-fiber foods, such as fresh vegetables or fruit and whole grains. Drink plenty of fluids to keep your urine clear or pale yellow. Take warm sitz baths to soothe any pain or discomfort caused by hemorrhoids. Use hemorrhoid cream if   your caregiver approves. If you develop varicose veins, wear support hose. Elevate your feet for 15 minutes, 3-4 times a day. Limit salt in your diet. Avoid heavy lifting, wear low heal shoes, and practice good posture. Rest a lot with your legs elevated if you have leg cramps or low back pain. Visit your dentist if you have not gone during your pregnancy. Use a soft toothbrush to brush your teeth and be gentle when you floss. A sexual relationship may be continued unless your caregiver directs you otherwise. Do not travel far distances unless it is absolutely necessary and only with the approval of your caregiver. Take prenatal classes to understand, practice, and ask questions about the labor and delivery. Make a trial run to the hospital. Pack your hospital bag. Prepare the baby's nursery. Continue to go to all your prenatal visits as directed by your caregiver. SEEK MEDICAL CARE IF: You are unsure if you are in labor or if your water has broken. You have dizziness. You have mild pelvic cramps, pelvic pressure, or nagging pain in your abdominal area. You have persistent nausea, vomiting, or diarrhea. You have a bad smelling vaginal discharge. You have pain with urination. SEEK IMMEDIATE MEDICAL CARE IF:  You  have a fever. You are leaking fluid from your vagina. You have spotting or bleeding from your vagina. You have severe abdominal cramping or pain. You have rapid weight loss or gain. You have shortness of breath with chest pain. You notice sudden or extreme swelling of your face, hands, ankles, feet, or legs. You have not felt your baby move in over an hour. You have severe headaches that do not go away with medicine. You have vision changes. Document Released: 08/19/2001 Document Revised: 08/30/2013 Document Reviewed: 10/26/2012 ExitCare Patient Information 2015 [employer]. This information is not intended to replace advice given to you by your health care provider. Make sure you discuss any questions you have with your health care provider.       

## 2023-08-26 NOTE — Progress Notes (Signed)
   LOW-RISK PREGNANCY VISIT Patient name: Sandy Carter MRN [id number]  Date of birth: Nov 23, 2000 Chief Complaint:   Routine Prenatal Visit (Some nausea and vomiting today)  History of Present Illness:   Sandy Carter is a 22 y.o. G42P1011 female at [redacted]w[redacted]d with an Estimated Date of Delivery: 10/16/23 being seen today for ongoing management of a low-risk pregnancy.  Today she reports  feeling nauseous with some vomiting recently . Contractions: Irritability.  .  Movement: Present. denies leaking of fluid. Review of Systems:   Pertinent items are noted in HPI Denies abnormal vaginal discharge w/ itching/odor/irritation, headaches, visual changes, shortness of breath, chest pain, abdominal pain, severe nausea/vomiting, or problems with urination or bowel movements unless otherwise stated above. Pertinent History Reviewed:  Reviewed past medical,surgical, social, obstetrical and family history.  Reviewed problem list, medications and allergies. Physical Assessment:   Vitals:   08/26/23 1614  BP: 100/62  Pulse: 86  Weight: 138 lb (62.6 kg)  Body mass index is 25.24 kg/m.        Physical Examination:   General appearance: Well appearing, and in no distress  Mental status: Alert, oriented to person, place, and time  Skin: Warm & dry  Cardiovascular: Normal heart rate noted  Respiratory: Normal respiratory effort, no distress  Abdomen: Soft, gravid, nontender  Pelvic: Cervical exam deferred         Extremities: Edema: None  Fetal Status: Fetal Heart Rate (bpm): 142 Fundal Height: 33 cm Movement: Present    No results found for this or any previous visit (from the past 24 hours).  Assessment & Plan:  1) Low-risk pregnancy G3P1011 at [redacted]w[redacted]d with an Estimated Date of Delivery: 10/16/23   2) Hx palpitations/CP, nl echo 08/12/23  3) Hx hyperthyroid, no meds, will get TSH ~36wks  4) Worsening anx/dep, hasn't picked up 5mg  Lexapro to add to currently 10mg  dosage, will resend  5) Hx C/S,  still leaning towards rLTCS  6) Anemia, will recheck with 36wk TSH  7) Hx FGR, 36wk u/s already scheduled  8) N/V returning, will restart Diclegis   Meds:  Meds ordered this encounter  Medications   escitalopram (LEXAPRO) 5 MG tablet    Sig: Take 1 tablet (5 mg total) by mouth daily.    Dispense:  30 tablet    Refill:  3    Supervising Provider:   Myna Hidalgo [[id number]]   Labs/procedures today: none  Plan:  Continue routine obstetrical care   Reviewed: Preterm labor symptoms and general obstetric precautions including but not limited to vaginal bleeding, contractions, leaking of fluid and fetal movement were reviewed in detail with the patient.  All questions were answered. Has home bp cuff. Check bp weekly, let us know if >140/90.   Follow-up: Return for As scheduled.  No orders of the defined types were placed in this encounter.  [name] CNM 08/26/2023 4:40 PM

## 2023-08-31 ENCOUNTER — Encounter: Payer: Self-pay | Admitting: Women's Health

## 2023-09-10 ENCOUNTER — Encounter: Payer: Self-pay | Admitting: Women's Health

## 2023-09-10 ENCOUNTER — Ambulatory Visit: Payer: MEDICAID | Admitting: Advanced Practice Midwife

## 2023-09-10 ENCOUNTER — Encounter: Payer: Self-pay | Admitting: Advanced Practice Midwife

## 2023-09-10 VITALS — BP 121/70 | HR 98 | Wt 141.0 lb

## 2023-09-10 DIAGNOSIS — Z8759 Personal history of other complications of pregnancy, childbirth and the puerperium: Secondary | ICD-10-CM

## 2023-09-10 DIAGNOSIS — Z3483 Encounter for supervision of other normal pregnancy, third trimester: Secondary | ICD-10-CM

## 2023-09-10 DIAGNOSIS — F418 Other specified anxiety disorders: Secondary | ICD-10-CM

## 2023-09-10 DIAGNOSIS — Z98891 History of uterine scar from previous surgery: Secondary | ICD-10-CM

## 2023-09-10 DIAGNOSIS — Z3A34 34 weeks gestation of pregnancy: Secondary | ICD-10-CM

## 2023-09-10 NOTE — Progress Notes (Signed)
   LOW-RISK PREGNANCY VISIT Patient name: Sandy Carter MRN [id number]  Date of birth: 08-Jun-2001 Chief Complaint:   Routine Prenatal Visit  History of Present Illness:   Sandy Carter is a 23 y.o. G63P1011 female at [redacted]w[redacted]d with an Estimated Date of Delivery: 10/16/23 being seen today for ongoing management of a low-risk pregnancy.  Today she reports this morning she started having some. Contractions: Irregular. Vag. Bleeding: Scant.  Movement: Present. denies leaking of fluid. Review of Systems:   Pertinent items are noted in HPI Denies abnormal vaginal discharge w/ itching/odor/irritation, headaches, visual changes, shortness of breath, chest pain, abdominal pain, severe nausea/vomiting, or problems with urination or bowel movements unless otherwise stated above. Pertinent History Reviewed:  Reviewed past medical,surgical, social, obstetrical and family history.  Reviewed problem list, medications and allergies. Physical Assessment:   Vitals:   09/10/23 1620  BP: 121/70  Pulse: 98  Weight: 141 lb (64 kg)  Body mass index is 25.79 kg/m.        Physical Examination:   General appearance: Well appearing, and in no distress  Mental status: Alert, oriented to person, place, and time  Skin: Warm & dry  Cardiovascular: Normal heart rate noted  Respiratory: Normal respiratory effort, no distress  Abdomen: Soft, gravid, nontender  Pelvic: Cervical exam deferred  Dilation: Closed Effacement (%): 70 Station: -1  Extremities: Edema: None Chaperone:  N/A   Fetal Status: Fetal Heart Rate (bpm): 148 Fundal Height: 36 cm Movement: Present Presentation: Vertex    No results found for this or any previous visit (from the past 24 hours).  Assessment & Plan:    Pregnancy: G3P1011 at [redacted]w[redacted]d 1. Supervision of normal intrauterine pregnancy in multigravida, third trimester (Primary)   2. [redacted] weeks gestation of pregnancy   3. History of prior pregnancy with IUGR newborn EFW scheduled for 2  weeks  4. History of cesarean section Plans TOLAC if spontaneous advanced labor, repeat cs if not      Meds: No orders of the defined types were placed in this encounter.  Labs/procedures today: none  Plan:  Continue routine obstetrical care  Next visit: prefers in person     Follow-up: No follow-ups on file.  Future Appointments  Date Time Provider Department Center  09/23/2023  8:30 AM [facility] - [facility] IMG 2 [facility] None  09/23/2023  9:50 AM Loreli Suzen BIR[facility], CNM [facility]    No orders of the defined types were placed in this encounter.  Cathlean Ely DNP, CNM 09/10/2023 6:35 PM

## 2023-09-10 NOTE — Patient Instructions (Signed)
 AM I IN LABOR? What is labor? Labor is the work that your body does to birth your baby. Your uterus (the womb) contracts. Your cervix (the mouth of the uterus) opens. You will push your baby out into the world.  What do contractions (labor pains) feel like? When they first start, contractions usually feel like cramps during your period. Sometimes you feel pain in your back. Most often, contractions feel like muscles pulling painfully in your lower belly. At first, the contractions will probably be 15 to 20 minutes apart. They will not feel too painful. As labor goes on, the contractions get stronger, closer together, and more painful.  How do I time the contractions? Time your contractions by counting the number of minutes from the start of one contraction to the start of the next contraction.  What should I do when the contractions start? If it is night and you can sleep, sleep. If it happens during the day, here are some things you can do to take care of yourself at home: ? Walk. If the pains you are having are real labor, walking will make the contractions come faster and harder. If the contractions are not going to continue and be real labor, walking will make the contractions slow down. ? Take a shower or bath. This will help you relax. ? Eat. Labor is a big event. It takes a lot of energy. ? Drink water. Not drinking enough water can cause false labor (contractions that hurt but do not open your cervix). If this is true labor, drinking water will help you have strength to get through your labor. ? Take a nap. Get all the rest you can. ? Get a massage. If your labor is in your back, a strong massage on your lower back may feel very good. Getting a foot massage is always good. ? Don't panic. You can do this. Your body was made for this. You are strong!  When should I go to the hospital or call my health care provider? ? Your contractions have been 5 minutes apart or less for at least 1  hour. ? If several contractions are so painful you cannot walk or talk during one. ? Your bag of waters breaks. (You may have a big gush of water or just water that runs down your legs when you walk.)  Are there other reasons to call my health care provider? Yes, you should call your health care provider or go to the hospital if you start to bleed like you are having a period- blood that soaks your underwear or runs down your legs, if you have sudden severe pain, if your baby has not moved for several hours, or if you are leaking green fluid. The rule is as follows: If you are very concerned about something, call.

## 2023-09-21 ENCOUNTER — Encounter: Payer: Self-pay | Admitting: Obstetrics & Gynecology

## 2023-09-23 ENCOUNTER — Encounter: Payer: Self-pay | Admitting: Advanced Practice Midwife

## 2023-09-23 ENCOUNTER — Ambulatory Visit: Payer: MEDICAID | Admitting: Radiology

## 2023-09-23 ENCOUNTER — Other Ambulatory Visit: Payer: Self-pay | Admitting: Women's Health

## 2023-09-23 ENCOUNTER — Other Ambulatory Visit: Payer: Self-pay | Admitting: Obstetrics & Gynecology

## 2023-09-23 ENCOUNTER — Ambulatory Visit (INDEPENDENT_AMBULATORY_CARE_PROVIDER_SITE_OTHER): Payer: MEDICAID | Admitting: Advanced Practice Midwife

## 2023-09-23 ENCOUNTER — Other Ambulatory Visit: Payer: Self-pay | Admitting: Advanced Practice Midwife

## 2023-09-23 ENCOUNTER — Other Ambulatory Visit (HOSPITAL_COMMUNITY)
Admission: RE | Admit: 2023-09-23 | Discharge: 2023-09-23 | Disposition: A | Discharge status: Home / Self Care | Payer: MEDICAID | Source: Ambulatory Visit | Attending: Advanced Practice Midwife | Admitting: Advanced Practice Midwife

## 2023-09-23 VITALS — BP 124/83 | HR 90 | Wt 148.0 lb

## 2023-09-23 DIAGNOSIS — Z3A36 36 weeks gestation of pregnancy: Secondary | ICD-10-CM | POA: Diagnosis not present

## 2023-09-23 DIAGNOSIS — Z98891 History of uterine scar from previous surgery: Secondary | ICD-10-CM

## 2023-09-23 DIAGNOSIS — N898 Other specified noninflammatory disorders of vagina: Secondary | ICD-10-CM | POA: Diagnosis present

## 2023-09-23 DIAGNOSIS — O365990 Maternal care for other known or suspected poor fetal growth, unspecified trimester, not applicable or unspecified: Secondary | ICD-10-CM

## 2023-09-23 DIAGNOSIS — Z87898 Personal history of other specified conditions: Secondary | ICD-10-CM

## 2023-09-23 DIAGNOSIS — O365930 Maternal care for other known or suspected poor fetal growth, third trimester, not applicable or unspecified: Secondary | ICD-10-CM | POA: Diagnosis not present

## 2023-09-23 DIAGNOSIS — O26893 Other specified pregnancy related conditions, third trimester: Secondary | ICD-10-CM | POA: Diagnosis present

## 2023-09-23 DIAGNOSIS — Z3483 Encounter for supervision of other normal pregnancy, third trimester: Secondary | ICD-10-CM

## 2023-09-23 DIAGNOSIS — O0993 Supervision of high risk pregnancy, unspecified, third trimester: Secondary | ICD-10-CM

## 2023-09-23 DIAGNOSIS — O0990 Supervision of high risk pregnancy, unspecified, unspecified trimester: Secondary | ICD-10-CM

## 2023-09-23 NOTE — Progress Notes (Signed)
 HIGH-RISK PREGNANCY VISIT Patient name: Sandy Carter MRN [id number]  Date of birth: October 03, 2000 Chief Complaint:   Routine Prenatal Visit  History of Present Illness:   Sandy Carter is a 23 y.o. G31P1011 female at [redacted]w[redacted]d with an Estimated Date of Delivery: 10/16/23 being seen today for ongoing management of a high-risk pregnancy complicated by fetal growth restriction 7.9% dx today.    Today she reports  having mucousy/thin vag d/c . Contractions: Irregular. Vag. Bleeding: None.  Movement: Present. denies leaking of fluid.      07/22/2023    8:50 AM 04/14/2023    2:24 PM 08/16/2021    9:48 AM 05/01/2021    3:16 PM 03/13/2021    1:54 PM  Depression screen PHQ 2/9  Decreased Interest 0 3 0 0 1  Down, Depressed, Hopeless 1 3 1  0 2  PHQ - 2 Score 1 6 1  0 3  Altered sleeping 2 2 1 3 3   Tired, decreased energy 2 3 2 3 3   Change in appetite 0 2 2 0 0  Feeling bad or failure about yourself  0 2 0 0 0  Trouble concentrating 2 3 2 3  0  Moving slowly or fidgety/restless 0 0 0 0 0  Suicidal thoughts 0 0 0 0 0  PHQ-9 Score 7 18 8 9 9         04/14/2023    2:24 PM 08/16/2021    9:49 AM 05/01/2021    3:17 PM 03/13/2021    1:54 PM  GAD 7 : Generalized Anxiety Score  Nervous, Anxious, on Edge 3 2 3 2   Control/stop worrying 3 1 3 3   Worry too much - different things 3 3 2 2   Trouble relaxing 3 1 3  0  Restless 1 0 0 0  Easily annoyed or irritable 3 1 1 2   Afraid - awful might happen 2 0 1 0  Total GAD 7 Score 18 8 13 9      Review of Systems:   Pertinent items are noted in HPI Denies abnormal vaginal discharge w/ itching/odor/irritation, headaches, visual changes, shortness of breath, chest pain, abdominal pain, severe nausea/vomiting, or problems with urination or bowel movements unless otherwise stated above. Pertinent History Reviewed:  Reviewed past medical,surgical, social, obstetrical and family history.  Reviewed problem list, medications and allergies. Physical Assessment:   Vitals:    09/23/23 0911  BP: 124/83  Pulse: 90  Weight: 148 lb (67.1 kg)  Body mass index is 27.07 kg/m.           Physical Examination:   General appearance: alert, well appearing, and in no distress  Mental status: alert, oriented to person, place, and time  Skin: warm & dry   Extremities: Edema: None    Cardiovascular: normal heart rate noted  Respiratory: normal respiratory effort, no distress  Abdomen: gravid, soft, non-tender  Pelvic: Cervical exam performed  Dilation: Closed Effacement (%): 50 Station: -3  Fetal Status: Fetal Heart Rate (bpm): 132 u/s   Movement: Present    Fetal Surveillance Testing today: US :  GA = 36+5 weeks Single active female fetus,  cephalic, FHR = 132 bpm   posterior pl high, gr1 AFI = 16.2 cm, 65%,  MVP = 5.1 cm,   EFW 7.9%  2409g   FGR BPP = 8/8,  RI = 0.60,  0.56  59% Normal ovaries - neg adnexal regions - neg CDS    No results found for this or any previous visit (from the past  24 hours).  Assessment & Plan:  High-risk pregnancy: G3P1011 at [redacted]w[redacted]d with an Estimated Date of Delivery: 10/16/23   1) FGR w nl dopplers dx today, EFW 7.9%; will set up for ante testing with delivery 38-39wk per MFM protocol  2) Prev C/S, wants repeat; may consider TOLAC if labors spontaneously; will send to [place] to schedule  Meds: No orders of the defined types were placed in this encounter.   Labs/procedures today: GBS, GC/CT, and U/S  Treatment Plan:  start 2x/wk testing (BPP/doppler) with delivery 38-39wks  Reviewed: Term labor symptoms and general obstetric precautions including but not limited to vaginal bleeding, contractions, leaking of fluid and fetal movement were reviewed in detail with the patient.  All questions were answered. Does have home bp cuff. Office bp cuff given: not applicable. Check bp weekly, let us  know if consistently >140 and/or >90.  Follow-up: Return for needs 2x/ week BPP/dopplers/NST and HROB visits until 1/31.   Future Appointments   Date Time Provider Department Center  09/28/2023  1:30 PM [facility] - FT IMG 2 [facility] None  09/28/2023  2:30 PM Ferd Householder, CNM [facility]  10/01/2023  3:30 PM [facility] NURSE [facility]  10/05/2023  1:30 PM [facility] IMG 2 [facility] None  10/05/2023  2:30 PM Ferd Householder, CNM [facility]  10/08/2023  3:30 PM [facility] NURSE [facility]    Orders Placed This Encounter  Procedures   Strep Gp B NAA+Rflx   Jolayne Natter CNM 09/23/2023 11:13 AM

## 2023-09-23 NOTE — Progress Notes (Signed)
 US :  GA = 36+5 weeks Single active female fetus,  cephalic, FHR = 132 bpm   posterior pl high, gr1 AFI = 16.2 cm, 65%,  MVP = 5.1 cm,   EFW 7.9%  2409g   FGR BPP = 8/8,  RI = 0.60,  0.56  59% Normal ovaries - neg adnexal regions - neg CDS

## 2023-09-24 ENCOUNTER — Encounter: Payer: Self-pay | Admitting: Obstetrics & Gynecology

## 2023-09-24 LAB — CERVICOVAGINAL ANCILLARY ONLY
Bacterial Vaginitis (gardnerella): POSITIVE — AB
Candida Glabrata: NEGATIVE
Candida Vaginitis: NEGATIVE
Chlamydia: NEGATIVE
Comment: NEGATIVE
Comment: NEGATIVE
Comment: NEGATIVE
Comment: NEGATIVE
Comment: NEGATIVE
Comment: NORMAL
Neisseria Gonorrhea: NEGATIVE
Trichomonas: NEGATIVE

## 2023-09-24 NOTE — Patient Instructions (Addendum)
Sandy Carter  09/24/2023   Your procedure is scheduled on:  10/05/2023  Arrive at 1030 at Entrance C on [address] at [facility]  and [address]. You are invited to use the FREE valet parking or use the Visitor's parking deck.  Pick up the phone at the desk and dial [phone].  Call this number if you have problems the morning of surgery: [phone]  Remember:   Do not eat food:(After Midnight) Desps de medianoche.  You may drink clear liquids until arrival at __1130___.  Clear liquids means a liquid you can see thru.  It can have color such as Cola or Kool aid.  Tea is OK and coffee as long as no milk or creamer of any kind.  Take these medicines the morning of surgery with A SIP OF WATER:  Bring your inhaler with you. Take lexapro as prescribed and macrobid   Do not wear jewelry, make-up or nail polish.  Do not wear lotions, powders, or perfumes. Do not wear deodorant.  Do not shave 48 hours prior to surgery.  Do not bring valuables to the hospital.  [facility] is not   responsible for any belongings or valuables brought to the hospital.  Contacts, dentures or bridgework may not be worn into surgery.  Leave suitcase in the car. After surgery it may be brought to your room.  For patients admitted to the hospital, checkout time is 11:00 AM the day of              discharge.      Please read over the following fact sheets that you were given:     Preparing for Surgery

## 2023-09-25 ENCOUNTER — Encounter (HOSPITAL_COMMUNITY): Payer: Self-pay

## 2023-09-25 LAB — STREP GP B NAA+RFLX: Strep Gp B NAA+Rflx: NEGATIVE

## 2023-09-26 ENCOUNTER — Other Ambulatory Visit: Payer: Self-pay | Admitting: Advanced Practice Midwife

## 2023-09-26 ENCOUNTER — Encounter: Payer: Self-pay | Admitting: Advanced Practice Midwife

## 2023-09-26 DIAGNOSIS — O0990 Supervision of high risk pregnancy, unspecified, unspecified trimester: Secondary | ICD-10-CM

## 2023-09-26 MED ORDER — METRONIDAZOLE 500 MG PO TABS: 500.0000 mg | ORAL_TABLET | Freq: Two times a day (BID) | ORAL | 0 refills | Status: DC

## 2023-09-27 ENCOUNTER — Encounter: Payer: Self-pay | Admitting: Obstetrics & Gynecology

## 2023-09-28 ENCOUNTER — Other Ambulatory Visit: Payer: MEDICAID | Admitting: Radiology

## 2023-09-28 ENCOUNTER — Encounter: Payer: MEDICAID | Admitting: Women's Health

## 2023-09-29 ENCOUNTER — Other Ambulatory Visit: Payer: Self-pay | Admitting: Family Medicine

## 2023-09-29 DIAGNOSIS — Z98891 History of uterine scar from previous surgery: Secondary | ICD-10-CM

## 2023-09-30 ENCOUNTER — Encounter (HOSPITAL_COMMUNITY): Payer: Self-pay | Admitting: Obstetrics and Gynecology

## 2023-09-30 ENCOUNTER — Inpatient Hospital Stay (HOSPITAL_COMMUNITY)
Admission: AD | Admit: 2023-09-30 | Discharge: 2023-09-30 | Disposition: A | Discharge status: Home / Self Care | Payer: MEDICAID | Attending: Obstetrics and Gynecology | Admitting: Obstetrics and Gynecology

## 2023-09-30 DIAGNOSIS — O26893 Other specified pregnancy related conditions, third trimester: Secondary | ICD-10-CM | POA: Diagnosis present

## 2023-09-30 DIAGNOSIS — O365990 Maternal care for other known or suspected poor fetal growth, unspecified trimester, not applicable or unspecified: Secondary | ICD-10-CM

## 2023-09-30 DIAGNOSIS — O471 False labor at or after 37 completed weeks of gestation: Secondary | ICD-10-CM | POA: Diagnosis not present

## 2023-09-30 DIAGNOSIS — O365930 Maternal care for other known or suspected poor fetal growth, third trimester, not applicable or unspecified: Secondary | ICD-10-CM | POA: Diagnosis not present

## 2023-09-30 DIAGNOSIS — O0990 Supervision of high risk pregnancy, unspecified, unspecified trimester: Secondary | ICD-10-CM

## 2023-09-30 DIAGNOSIS — O0993 Supervision of high risk pregnancy, unspecified, third trimester: Secondary | ICD-10-CM

## 2023-09-30 DIAGNOSIS — O479 False labor, unspecified: Secondary | ICD-10-CM | POA: Diagnosis not present

## 2023-09-30 DIAGNOSIS — R0789 Other chest pain: Secondary | ICD-10-CM | POA: Insufficient documentation

## 2023-09-30 DIAGNOSIS — R079 Chest pain, unspecified: Secondary | ICD-10-CM

## 2023-09-30 DIAGNOSIS — Z3A37 37 weeks gestation of pregnancy: Secondary | ICD-10-CM | POA: Insufficient documentation

## 2023-09-30 NOTE — MAU Provider Note (Signed)
Chief Complaint:  Contractions   Event Date/Time   First Provider Initiated Contact with Patient 09/30/23 0349     HPI  HPI: Sandy Carter is a 23 y.o. G3P1011 at 66w5dwho presents to maternity admissions reporting uterine contractions.  While undergoing labor eval, patient complained of recurrent left chest tightness/pain with fast heart rate.  Has had this several times before and had a full workup with Dr Servando Salina including Echo.  These were normal  .Thinks it may be her thyroid. States is not on meds for that and does not have an endocrinologist anymore.  Denies shortness of breath. .   Past Medical History: Past Medical History:  Diagnosis Date   ADD (attention deficit disorder)     in her teens   Anxiety    Depression    Dysrhythmia    palpitations - from Hyperthyroidism   Headache    Hyperthyroidism    not treated at this time   Lumbar herniated disc    ODD (oppositional defiant disorder)    in her teens   PONV (postoperative nausea and vomiting)    Seizures (HCC)    shakes alot, is aware of what is going on, feels them coming on, has not seen a neurologist   Tuberculosis    before age 5    Past obstetric history: OB History  Gravida Para Term Preterm AB Living  3 1 1  1 1   SAB IAB Ectopic Multiple Live Births  1    1    # Outcome Date GA Lbr Len/2nd Weight Sex Type Anes PTL Lv  3 Current           2 SAB 09/2022          1 Term 10/19/21 [redacted]w[redacted]d  2220 g M CS-LTranv Spinal N LIV     Complications: Fetal Intolerance, Fetal growth restriction antepartum    Past Surgical History: Past Surgical History:  Procedure Laterality Date   CESAREAN SECTION N/A 10/19/2021   Procedure: CESAREAN SECTION;  Surgeon: Myna Hidalgo, DO;  Location: [facility] LD ORS;  Service: Obstetrics;  Laterality: N/A;   COLONOSCOPY WITH PROPOFOL N/A 09/17/2020   Procedure: COLONOSCOPY WITH PROPOFOL;  Surgeon: Lanelle Bal, DO;  Location: [facility] ENDO SUITE;  Service: Endoscopy;  Laterality: N/A;   2:00pm   LAPAROSCOPIC APPENDECTOMY N/A 10/17/2021   Procedure: APPENDECTOMY LAPAROSCOPIC;  Surgeon: Andria Meuse, MD;  Location: [facility] OR;  Service: General;  Laterality: N/A;   LUMBAR LAMINECTOMY/DECOMPRESSION MICRODISCECTOMY Right 12/28/2020   Procedure: Right Lumbar Five Sacral One Microdiscectomy;  Surgeon: Coletta Memos, MD;  Location: [facility] OR;  Service: Neurosurgery;  Laterality: Right;   OPEN REDUCTION INTERNAL FIXATION (ORIF) METACARPAL Right 12/08/2022   Procedure: OPEN REDUCTION INTERNAL FIXATION (ORIF) METACARPAL;  Surgeon: Oliver Barre, MD;  Location: [facility] ORS;  Service: Orthopedics;  Laterality: Right;   WISDOM TOOTH EXTRACTION      Family History: Family History  Adopted: Yes  Problem Relation Age of Onset   Diabetes Maternal Grandmother    Alcoholism Mother    Bone cancer Mother     Social History: Social History   Tobacco Use   Smoking status: Former    Types: Cigarettes   Smokeless tobacco: Never  Vaping Use   Vaping status: Former  Substance Use Topics   Alcohol use: Not Currently    Comment: occ   Drug use: Yes    Types: Marijuana    Comment: occasionaly, currently stopped- last time smoked- this mth  Allergies:  Allergies  Allergen Reactions   Depo-Provera [Medroxyprogesterone Acetate] Hives, Shortness Of Breath and Cough   Penicillins Rash    Reaction: 5 years   Gabapentin Other (See Comments)    Headaches, lightheaded   Other Nausea And Vomiting    Pt has nausea and vomiting post procedure    Penicillin G Other (See Comments)    Meds:  Medications Prior to Admission  Medication Sig Dispense Refill Last Dose/Taking   Blood Pressure Monitor MISC For regular home bp monitoring during pregnancy (Patient not taking: Reported on 09/23/2023) 1 each 0    escitalopram (LEXAPRO) 10 MG tablet 1 tablet daily 30 tablet 6    escitalopram (LEXAPRO) 5 MG tablet Take 1 tablet (5 mg total) by mouth daily. (Patient not taking: Reported on 09/29/2023) 30 tablet 3     ferrous sulfate 325 (65 FE) MG tablet Take 1 tablet (325 mg total) by mouth every other day. (Patient taking differently: Take 325 mg by mouth daily.) 45 tablet 2    metroNIDAZOLE (FLAGYL) 500 MG tablet Take 1 tablet (500 mg total) by mouth 2 (two) times daily. 14 tablet 0    nitrofurantoin, macrocrystal-monohydrate, (MACROBID) 100 MG capsule Take 1 capsule (100 mg total) by mouth at bedtime. Begin this treatment after you complete your course of Bactrim and take one each night until delivery 30 capsule 4     I have reviewed patient's Past Medical Hx, Surgical Hx, Family Hx, Social Hx, medications and allergies.   ROS:  Review of Systems  Constitutional:  Negative for chills and fever.  Respiratory:  Positive for chest tightness. Negative for shortness of breath.   Genitourinary:  Negative for vaginal bleeding.   Other systems negative  Physical Exam  Patient Vitals for the past 24 hrs:  BP Temp Pulse Resp Height Weight  09/30/23 0232 118/86 97.8 F (36.6 C) (!) 109 16 5\' 2"  (1.575 m) 68.2 kg   Constitutional: Well-developed, well-nourished female in no acute distress. Very comfortable appearing.   Cardiovascular: normal rate and rhythm Respiratory: normal effort, clear to auscultation bilaterally GI: Abd soft, non-tender, gravid appropriate for gestational age.   No rebound or guarding. MS: Extremities nontender, no edema, normal ROM Neurologic: Alert and oriented x 4.  GU: Neg CVAT.  PELVIC EXAM:  Dilation: Closed Effacement (%): 50 Station: Ballotable Exam by:: [employer], RN  FHT:  Baseline 140 , moderate variability, accelerations present, no decelerations Contractions:  Irregular     Labs: No results found for this or any previous visit (from the past 24 hours). O/Positive/-- (08/06 1536)  Imaging:  EKG showed normal sinus rhythm with no ectopy Rate94  MAU Course/MDM: I have reviewed the triage vital signs and the nursing notes.   Pertinent labs & imaging  results that were available during my care of the patient were reviewed by me and considered in my medical decision making (see chart for details).      I have reviewed her medical records including past results, notes and treatments.    Assessment: Single IUP at [redacted]w[redacted]d False labor Intermittent recurrent chest tightness/pain Normal EKG, no observed tachycardia  Plan: Discharge home Labor precautions and fetal kick counts Follow up in Office for prenatal visits and recheck Followup with OB and Cardiology Encouraged to return if she develops worsening of symptoms, increase in pain, fever, or other concerning symptoms.    Pt stable at time of discharge.  Wynelle Bourgeois CNM, MSN Certified Nurse-Midwife 09/30/2023 3:49 AM

## 2023-09-30 NOTE — MAU Note (Signed)
Pt reporting tightness in chest. CNM made aware - new orders placed.

## 2023-09-30 NOTE — MAU Note (Addendum)
Pt says feels UC's - strong since 7/10 [facility] Denies HSV GBS- neg VE last appointment - closed

## 2023-10-01 ENCOUNTER — Encounter: Payer: Self-pay | Admitting: Obstetrics & Gynecology

## 2023-10-01 ENCOUNTER — Ambulatory Visit: Payer: MEDICAID | Admitting: Obstetrics & Gynecology

## 2023-10-01 ENCOUNTER — Other Ambulatory Visit: Payer: MEDICAID

## 2023-10-01 VITALS — BP 135/82 | HR 92

## 2023-10-01 DIAGNOSIS — O0990 Supervision of high risk pregnancy, unspecified, unspecified trimester: Secondary | ICD-10-CM

## 2023-10-01 DIAGNOSIS — O99013 Anemia complicating pregnancy, third trimester: Secondary | ICD-10-CM

## 2023-10-01 DIAGNOSIS — Z98891 History of uterine scar from previous surgery: Secondary | ICD-10-CM

## 2023-10-01 DIAGNOSIS — Z3A37 37 weeks gestation of pregnancy: Secondary | ICD-10-CM | POA: Diagnosis not present

## 2023-10-01 DIAGNOSIS — O0993 Supervision of high risk pregnancy, unspecified, third trimester: Secondary | ICD-10-CM

## 2023-10-01 DIAGNOSIS — O365930 Maternal care for other known or suspected poor fetal growth, third trimester, not applicable or unspecified: Secondary | ICD-10-CM | POA: Diagnosis not present

## 2023-10-01 DIAGNOSIS — O365990 Maternal care for other known or suspected poor fetal growth, unspecified trimester, not applicable or unspecified: Secondary | ICD-10-CM

## 2023-10-01 NOTE — Progress Notes (Signed)
HIGH-RISK PREGNANCY VISIT Patient name: Sandy Carter MRN [id number]  Date of birth: 2001-03-30 Chief Complaint:   Routine Prenatal Visit (NST)  History of Present Illness:   Sandy Carter is a 23 y.o. G38P1011 female at [redacted]w[redacted]d with an Estimated Date of Delivery: 10/16/23 being seen today for ongoing management of a high-risk pregnancy complicated by:  -FGR -Dep/anxiety -Prior C-section x1 -Recurrent UTI -Anemia   Today she reports occasional contractions.   Contractions: Not present. Vag. Bleeding: None.  Movement: Present. denies leaking of fluid.      07/22/2023    8:50 AM 04/14/2023    2:24 PM 08/16/2021    9:48 AM 05/01/2021    3:16 PM 03/13/2021    1:54 PM  Depression screen PHQ 2/9  Decreased Interest 0 3 0 0 1  Down, Depressed, Hopeless 1 3 1  0 2  PHQ - 2 Score 1 6 1  0 3  Altered sleeping 2 2 1 3 3   Tired, decreased energy 2 3 2 3 3   Change in appetite 0 2 2 0 0  Feeling bad or failure about yourself  0 2 0 0 0  Trouble concentrating 2 3 2 3  0  Moving slowly or fidgety/restless 0 0 0 0 0  Suicidal thoughts 0 0 0 0 0  PHQ-9 Score 7 18 8 9 9      Current Outpatient Medications  Medication Instructions   Blood Pressure Monitor MISC For regular home bp monitoring during pregnancy   escitalopram (LEXAPRO) 10 MG tablet 1 tablet daily   escitalopram (LEXAPRO) 5 mg, Oral, Daily   ferrous sulfate 325 mg, Oral, Every other day   metroNIDAZOLE (FLAGYL) 500 mg, Oral, 2 times daily   nitrofurantoin (macrocrystal-monohydrate) (MACROBID) 100 mg, Oral, Daily at bedtime, Begin this treatment after you complete your course of Bactrim and take one each night until delivery     Review of Systems:   Pertinent items are noted in HPI Denies abnormal vaginal discharge w/ itching/odor/irritation, headaches, visual changes, shortness of breath, chest pain, abdominal pain, severe nausea/vomiting, or problems with urination or bowel movements unless otherwise stated above. Pertinent  History Reviewed:  Reviewed past medical,surgical, social, obstetrical and family history.  Reviewed problem list, medications and allergies. Physical Assessment:   Vitals:   10/01/23 1531  BP: 135/82  Pulse: 92  There is no height or weight on file to calculate BMI.           Physical Examination:   General appearance: alert, well appearing, and in no distress  Mental status: normal mood, behavior, speech, dress, motor activity, and thought processes  Skin: warm & dry   Extremities:      Cardiovascular: normal heart rate noted  Respiratory: normal respiratory effort, no distress  Abdomen: gravid, soft, non-tender  Pelvic: Cervical exam deferred         Fetal Status:     Movement: Present    Fetal Surveillance Testing today: NST  NST being performed due to FGR   Fetal Monitoring:  Baseline: 120 bpm, Variability: moderate, Accelerations: present, The accelerations are >15 bpm and more than 2 in 20 minutes, and Decelerations: Absent     Final diagnosis:   Reactive NST      Chaperone: N/A    No results found for this or any previous visit (from the past 24 hours).   Assessment & Plan:  High-risk pregnancy: G3P1011 at [redacted]w[redacted]d with an Estimated Date of Delivery: 10/16/23   -FGR Unable to make BPP,  reactive NST today Scheduled for repeat CD on Monday -Dep/anxiety on Lexapro -Prior C-section x1 -Recurrent UTI -Anemia on oral iron  Meds: No orders of the defined types were placed in this encounter.   Labs/procedures today: NST  Treatment Plan:  routine OB care, delivery scheduled for Monday  Reviewed: Term labor symptoms and general obstetric precautions including but not limited to vaginal bleeding, contractions, leaking of fluid and fetal movement were reviewed in detail with the patient.  All questions were answered. Pt has home bp cuff. Check bp weekly, let us know if >140/90.   Follow-up: Return for 1 wk incision check.   Future Appointments  Date Time  Provider Department Center  10/02/2023 10:30 AM [facility] PAT 1 [facility] None    No orders of the defined types were placed in this encounter.   Myna Hidalgo, DO Attending Obstetrician & Gynecologist, [facility] for [employer], [facility]

## 2023-10-02 ENCOUNTER — Encounter (HOSPITAL_COMMUNITY)
Admission: RE | Admit: 2023-10-02 | Discharge: 2023-10-02 | Disposition: A | Discharge status: Home / Self Care | Payer: MEDICAID | Source: Ambulatory Visit | Attending: Family Medicine | Admitting: Family Medicine

## 2023-10-02 ENCOUNTER — Encounter (HOSPITAL_COMMUNITY): Payer: Self-pay

## 2023-10-04 ENCOUNTER — Encounter (HOSPITAL_COMMUNITY): Payer: Self-pay | Admitting: Family Medicine

## 2023-10-05 ENCOUNTER — Other Ambulatory Visit: Payer: Self-pay

## 2023-10-05 ENCOUNTER — Encounter (HOSPITAL_COMMUNITY): Payer: Self-pay | Admitting: Family Medicine

## 2023-10-05 ENCOUNTER — Encounter (HOSPITAL_COMMUNITY)
Admission: RE | Disposition: A | Discharge status: Home / Self Care | Payer: Self-pay | Source: Home / Self Care | Attending: Family Medicine

## 2023-10-05 ENCOUNTER — Other Ambulatory Visit: Payer: MEDICAID | Admitting: Radiology

## 2023-10-05 ENCOUNTER — Inpatient Hospital Stay (HOSPITAL_COMMUNITY)
Admission: RE | Admit: 2023-10-05 | Discharge: 2023-10-08 | DRG: 787 | Disposition: A | Discharge status: Home / Self Care | Payer: MEDICAID | Attending: Family Medicine | Admitting: Family Medicine

## 2023-10-05 ENCOUNTER — Inpatient Hospital Stay (HOSPITAL_COMMUNITY): Payer: MEDICAID | Admitting: Anesthesiology

## 2023-10-05 ENCOUNTER — Encounter: Payer: MEDICAID | Admitting: Women's Health

## 2023-10-05 DIAGNOSIS — F411 Generalized anxiety disorder: Secondary | ICD-10-CM | POA: Diagnosis present

## 2023-10-05 DIAGNOSIS — Z833 Family history of diabetes mellitus: Secondary | ICD-10-CM | POA: Diagnosis not present

## 2023-10-05 DIAGNOSIS — F1290 Cannabis use, unspecified, uncomplicated: Secondary | ICD-10-CM | POA: Diagnosis present

## 2023-10-05 DIAGNOSIS — G971 Other reaction to spinal and lumbar puncture: Secondary | ICD-10-CM | POA: Diagnosis not present

## 2023-10-05 DIAGNOSIS — Z79899 Other long term (current) drug therapy: Secondary | ICD-10-CM | POA: Diagnosis not present

## 2023-10-05 DIAGNOSIS — O34211 Maternal care for low transverse scar from previous cesarean delivery: Principal | ICD-10-CM | POA: Diagnosis present

## 2023-10-05 DIAGNOSIS — Z98891 History of uterine scar from previous surgery: Principal | ICD-10-CM

## 2023-10-05 DIAGNOSIS — Z87891 Personal history of nicotine dependence: Secondary | ICD-10-CM | POA: Diagnosis not present

## 2023-10-05 DIAGNOSIS — F329 Major depressive disorder, single episode, unspecified: Secondary | ICD-10-CM | POA: Diagnosis present

## 2023-10-05 DIAGNOSIS — O99344 Other mental disorders complicating childbirth: Secondary | ICD-10-CM

## 2023-10-05 DIAGNOSIS — R519 Headache, unspecified: Secondary | ICD-10-CM | POA: Diagnosis not present

## 2023-10-05 DIAGNOSIS — O9089 Other complications of the puerperium, not elsewhere classified: Secondary | ICD-10-CM | POA: Diagnosis not present

## 2023-10-05 DIAGNOSIS — Z888 Allergy status to other drugs, medicaments and biological substances status: Secondary | ICD-10-CM

## 2023-10-05 DIAGNOSIS — Z880 Allergy status to penicillin: Secondary | ICD-10-CM

## 2023-10-05 DIAGNOSIS — G40909 Epilepsy, unspecified, not intractable, without status epilepticus: Secondary | ICD-10-CM | POA: Diagnosis present

## 2023-10-05 DIAGNOSIS — Z3A38 38 weeks gestation of pregnancy: Secondary | ICD-10-CM

## 2023-10-05 DIAGNOSIS — O909 Complication of the puerperium, unspecified: Secondary | ICD-10-CM | POA: Diagnosis present

## 2023-10-05 DIAGNOSIS — O99354 Diseases of the nervous system complicating childbirth: Secondary | ICD-10-CM | POA: Diagnosis present

## 2023-10-05 DIAGNOSIS — Z3490 Encounter for supervision of normal pregnancy, unspecified, unspecified trimester: Secondary | ICD-10-CM

## 2023-10-05 DIAGNOSIS — O9902 Anemia complicating childbirth: Secondary | ICD-10-CM | POA: Diagnosis present

## 2023-10-05 DIAGNOSIS — O99324 Drug use complicating childbirth: Secondary | ICD-10-CM | POA: Diagnosis present

## 2023-10-05 DIAGNOSIS — O365930 Maternal care for other known or suspected poor fetal growth, third trimester, not applicable or unspecified: Secondary | ICD-10-CM

## 2023-10-05 LAB — TYPE AND SCREEN
ABO/RH(D): O POS
Antibody Screen: NEGATIVE

## 2023-10-05 LAB — CBC
HCT: 34 % — ABNORMAL LOW (ref 36.0–46.0)
Hemoglobin: 11.2 g/dL — ABNORMAL LOW (ref 12.0–15.0)
MCH: 29.5 pg (ref 26.0–34.0)
MCHC: 32.9 g/dL (ref 30.0–36.0)
MCV: 89.5 fL (ref 80.0–100.0)
Platelets: 268 10*3/uL (ref 150–400)
RBC: 3.8 MIL/uL — ABNORMAL LOW (ref 3.87–5.11)
RDW: 13.6 % (ref 11.5–15.5)
WBC: 9.3 10*3/uL (ref 4.0–10.5)
nRBC: 0 % (ref 0.0–0.2)

## 2023-10-05 LAB — CREATININE, SERUM
Creatinine, Ser: 0.67 mg/dL (ref 0.44–1.00)
GFR, Estimated: >60 mL/min (ref >60)

## 2023-10-05 SURGERY — Surgical Case
Anesthesia: Spinal

## 2023-10-05 MED ORDER — DIPHENHYDRAMINE HCL 50 MG/ML IJ SOLN: 12.5000 mg | INTRAMUSCULAR | Status: DC | PRN

## 2023-10-05 MED ORDER — POVIDONE-IODINE 10 % EX SWAB
2.0000 | Freq: Once | CUTANEOUS | Status: AC
  Administered 2023-10-05: 2 via TOPICAL

## 2023-10-05 MED ORDER — ONDANSETRON HCL 4 MG/2ML IJ SOLN
INTRAMUSCULAR | Status: DC | PRN
  Administered 2023-10-05: 4 mg via INTRAVENOUS

## 2023-10-05 MED ORDER — DEXAMETHASONE SODIUM PHOSPHATE 4 MG/ML IJ SOLN
INTRAMUSCULAR | Status: AC
  Filled 2023-10-05: qty 1

## 2023-10-05 MED ORDER — COCONUT OIL OIL: 1.0000 | TOPICAL_OIL | Status: DC | PRN

## 2023-10-05 MED ORDER — MORPHINE SULFATE (PF) 0.5 MG/ML IJ SOLN
INTRAMUSCULAR | Status: DC | PRN
  Administered 2023-10-05: 150 ug via INTRATHECAL

## 2023-10-05 MED ORDER — ACETAMINOPHEN 500 MG PO TABS
1000.0000 mg | ORAL_TABLET | Freq: Four times a day (QID) | ORAL | Status: DC
Start: 2023-10-05 — End: 2023-10-08
  Administered 2023-10-05 – 2023-10-08 (×10): 1000 mg via ORAL
  Filled 2023-10-05 (×12): qty 2

## 2023-10-05 MED ORDER — LACTATED RINGERS IV SOLN: INTRAVENOUS | Status: DC

## 2023-10-05 MED ORDER — OXYTOCIN-SODIUM CHLORIDE 30-0.9 UT/500ML-% IV SOLN: 2.5000 [IU]/h | INTRAVENOUS | Status: AC

## 2023-10-05 MED ORDER — CEFAZOLIN SODIUM-DEXTROSE 2-4 GM/100ML-% IV SOLN
2.0000 g | INTRAVENOUS | Status: AC
  Administered 2023-10-05: 2 g via INTRAVENOUS

## 2023-10-05 MED ORDER — MEPERIDINE HCL 25 MG/ML IJ SOLN: 6.2500 mg | INTRAMUSCULAR | Status: DC | PRN

## 2023-10-05 MED ORDER — ENOXAPARIN SODIUM 40 MG/0.4ML IJ SOSY
40.0000 mg | PREFILLED_SYRINGE | INTRAMUSCULAR | Status: DC
  Administered 2023-10-06: 40 mg via SUBCUTANEOUS
  Filled 2023-10-05 (×3): qty 0.4

## 2023-10-05 MED ORDER — PRENATAL MULTIVITAMIN CH
1.0000 | ORAL_TABLET | Freq: Every day | ORAL | Status: DC
  Administered 2023-10-06 – 2023-10-08 (×3): 1 via ORAL
  Filled 2023-10-05 (×3): qty 1

## 2023-10-05 MED ORDER — KETOROLAC TROMETHAMINE 30 MG/ML IJ SOLN
30.0000 mg | Freq: Four times a day (QID) | INTRAMUSCULAR | Status: AC
  Administered 2023-10-06 (×3): 30 mg via INTRAVENOUS
  Filled 2023-10-05 (×3): qty 1

## 2023-10-05 MED ORDER — SODIUM CHLORIDE 0.9 % IV SOLN: 12.5000 mg | INTRAVENOUS | Status: DC | PRN

## 2023-10-05 MED ORDER — MAGNESIUM HYDROXIDE 400 MG/5ML PO SUSP: 30.0000 mL | ORAL | Status: DC | PRN

## 2023-10-05 MED ORDER — STERILE WATER FOR IRRIGATION IR SOLN
Status: DC | PRN
  Administered 2023-10-05: 1

## 2023-10-05 MED ORDER — OXYTOCIN-SODIUM CHLORIDE 30-0.9 UT/500ML-% IV SOLN
INTRAVENOUS | Status: DC | PRN
  Administered 2023-10-05: 300 mL via INTRAVENOUS

## 2023-10-05 MED ORDER — PHENYLEPHRINE HCL-NACL 20-0.9 MG/250ML-% IV SOLN
INTRAVENOUS | Status: DC | PRN
  Administered 2023-10-05: 60 ug/min via INTRAVENOUS

## 2023-10-05 MED ORDER — SIMETHICONE 80 MG PO CHEW: 80.0000 mg | CHEWABLE_TABLET | ORAL | Status: DC | PRN

## 2023-10-05 MED ORDER — SENNOSIDES-DOCUSATE SODIUM 8.6-50 MG PO TABS
2.0000 | ORAL_TABLET | Freq: Every day | ORAL | Status: DC
  Administered 2023-10-06 – 2023-10-08 (×2): 2 via ORAL
  Filled 2023-10-05 (×2): qty 2

## 2023-10-05 MED ORDER — SODIUM CHLORIDE 0.9% FLUSH
3.0000 mL | Freq: Two times a day (BID) | INTRAVENOUS | Status: DC
  Administered 2023-10-06: 10 mL via INTRAVENOUS

## 2023-10-05 MED ORDER — OXYCODONE HCL 5 MG PO TABS
5.0000 mg | ORAL_TABLET | Freq: Four times a day (QID) | ORAL | Status: DC | PRN
  Administered 2023-10-07: 10 mg via ORAL
  Administered 2023-10-07: 5 mg via ORAL
  Filled 2023-10-05 (×2): qty 1
  Filled 2023-10-05: qty 2

## 2023-10-05 MED ORDER — SODIUM CHLORIDE 0.9% FLUSH: 3.0000 mL | INTRAVENOUS | Status: DC | PRN

## 2023-10-05 MED ORDER — KETOROLAC TROMETHAMINE 30 MG/ML IJ SOLN
INTRAMUSCULAR | Status: AC
  Filled 2023-10-05: qty 1

## 2023-10-05 MED ORDER — DEXMEDETOMIDINE HCL IN NACL 200 MCG/50ML IV SOLN
INTRAVENOUS | Status: DC | PRN
  Administered 2023-10-05: 8 ug via INTRAVENOUS

## 2023-10-05 MED ORDER — MORPHINE SULFATE (PF) 0.5 MG/ML IJ SOLN
INTRAMUSCULAR | Status: AC
  Filled 2023-10-05: qty 10

## 2023-10-05 MED ORDER — DIPHENHYDRAMINE HCL 25 MG PO CAPS: 25.0000 mg | ORAL_CAPSULE | ORAL | Status: DC | PRN

## 2023-10-05 MED ORDER — SIMETHICONE 80 MG PO CHEW
80.0000 mg | CHEWABLE_TABLET | Freq: Three times a day (TID) | ORAL | Status: DC
  Administered 2023-10-06 – 2023-10-08 (×4): 80 mg via ORAL
  Filled 2023-10-05 (×4): qty 1

## 2023-10-05 MED ORDER — DIPHENHYDRAMINE HCL 25 MG PO CAPS: 25.0000 mg | ORAL_CAPSULE | Freq: Four times a day (QID) | ORAL | Status: DC | PRN

## 2023-10-05 MED ORDER — FENTANYL CITRATE (PF) 100 MCG/2ML IJ SOLN
INTRAMUSCULAR | Status: AC
  Filled 2023-10-05: qty 2

## 2023-10-05 MED ORDER — BUPIVACAINE IN DEXTROSE 0.75-8.25 % IT SOLN
INTRATHECAL | Status: DC | PRN
  Administered 2023-10-05: 1.6 mL via INTRATHECAL

## 2023-10-05 MED ORDER — CEFAZOLIN SODIUM-DEXTROSE 2-4 GM/100ML-% IV SOLN
INTRAVENOUS | Status: AC
  Filled 2023-10-05: qty 100

## 2023-10-05 MED ORDER — FENTANYL CITRATE (PF) 100 MCG/2ML IJ SOLN
INTRAMUSCULAR | Status: DC | PRN
  Administered 2023-10-05: 15 ug via INTRATHECAL

## 2023-10-05 MED ORDER — KETOROLAC TROMETHAMINE 30 MG/ML IJ SOLN
30.0000 mg | Freq: Once | INTRAMUSCULAR | Status: AC | PRN
Start: 2023-10-05 — End: 2023-10-05
  Administered 2023-10-05: 30 mg via INTRAVENOUS

## 2023-10-05 MED ORDER — ESCITALOPRAM OXALATE 10 MG PO TABS
5.0000 mg | ORAL_TABLET | Freq: Every day | ORAL | Status: DC
  Administered 2023-10-05 – 2023-10-06 (×2): 5 mg via ORAL
  Filled 2023-10-05 (×3): qty 1

## 2023-10-05 MED ORDER — SODIUM CHLORIDE 0.9 % IR SOLN
Status: DC | PRN
  Administered 2023-10-05: 1

## 2023-10-05 MED ORDER — DEXAMETHASONE SODIUM PHOSPHATE 10 MG/ML IJ SOLN
INTRAMUSCULAR | Status: DC | PRN
  Administered 2023-10-05: 4 mg via INTRAVENOUS

## 2023-10-05 MED ORDER — ACETAMINOPHEN 10 MG/ML IV SOLN
INTRAVENOUS | Status: DC | PRN
  Administered 2023-10-05: 1000 mg via INTRAVENOUS

## 2023-10-05 MED ORDER — HYDROMORPHONE HCL 1 MG/ML IJ SOLN: 0.2500 mg | INTRAMUSCULAR | Status: DC | PRN

## 2023-10-05 MED ORDER — MENTHOL 3 MG MT LOZG: 1.0000 | LOZENGE | OROMUCOSAL | Status: DC | PRN

## 2023-10-05 MED ORDER — OXYCODONE HCL 5 MG PO TABS
5.0000 mg | ORAL_TABLET | Freq: Once | ORAL | Status: DC | PRN
Start: 2023-10-05 — End: 2023-10-05

## 2023-10-05 MED ORDER — NALOXONE HCL 4 MG/10ML IJ SOLN: 1.0000 ug/kg/h | INTRAVENOUS | Status: DC | PRN

## 2023-10-05 MED ORDER — NALOXONE HCL 0.4 MG/ML IJ SOLN: 0.4000 mg | INTRAMUSCULAR | Status: DC | PRN

## 2023-10-05 MED ORDER — ONDANSETRON HCL 4 MG/2ML IJ SOLN
INTRAMUSCULAR | Status: AC
  Filled 2023-10-05: qty 2

## 2023-10-05 MED ORDER — OXYCODONE HCL 5 MG/5ML PO SOLN: 5.0000 mg | Freq: Once | ORAL | Status: DC | PRN

## 2023-10-05 MED ORDER — WITCH HAZEL-GLYCERIN EX PADS: 1.0000 | MEDICATED_PAD | CUTANEOUS | Status: DC | PRN

## 2023-10-05 MED ORDER — DIBUCAINE (PERIANAL) 1 % EX OINT: 1.0000 | TOPICAL_OINTMENT | CUTANEOUS | Status: DC | PRN

## 2023-10-05 MED ORDER — IBUPROFEN 600 MG PO TABS
600.0000 mg | ORAL_TABLET | Freq: Four times a day (QID) | ORAL | Status: DC
Start: 2023-10-06 — End: 2023-10-08
  Administered 2023-10-06 – 2023-10-08 (×8): 600 mg via ORAL
  Filled 2023-10-05 (×9): qty 1

## 2023-10-05 SURGICAL SUPPLY — 29 items
BENZOIN TINCTURE PRP APPL 2/3 (GAUZE/BANDAGES/DRESSINGS) IMPLANT
CHLORAPREP W/TINT 26 (MISCELLANEOUS) ×2 IMPLANT
CLAMP UMBILICAL CORD (MISCELLANEOUS) ×1 IMPLANT
CLOTH BEACON ORANGE TIMEOUT ST (SAFETY) ×1 IMPLANT
DERMABOND ADVANCED .7 DNX12 (GAUZE/BANDAGES/DRESSINGS) ×2 IMPLANT
DERMABOND ADVANCED .7 DNX6 (GAUZE/BANDAGES/DRESSINGS) IMPLANT
DRSG OPSITE POSTOP 4X10 (GAUZE/BANDAGES/DRESSINGS) ×1 IMPLANT
ELECT REM PT RETURN 9FT ADLT (ELECTROSURGICAL) ×1
ELECTRODE REM PT RTRN 9FT ADLT (ELECTROSURGICAL) ×1 IMPLANT
EXTRACTOR VACUUM M CUP 4 TUBE (SUCTIONS) IMPLANT
GAUZE SPONGE 4X4 12PLY STRL LF (GAUZE/BANDAGES/DRESSINGS) IMPLANT
GLOVE BIOGEL PI IND STRL 7.0 (GLOVE) ×2 IMPLANT
GLOVE BIOGEL PI IND STRL 7.5 (GLOVE) ×2 IMPLANT
GLOVE ECLIPSE 7.5 STRL STRAW (GLOVE) ×1 IMPLANT
GOWN STRL REUS W/TWL LRG LVL3 (GOWN DISPOSABLE) ×3 IMPLANT
KIT ABG SYR 3ML LUER SLIP (SYRINGE) IMPLANT
NDL HYPO 25X5/8 SAFETYGLIDE (NEEDLE) IMPLANT
NEEDLE HYPO 25X5/8 SAFETYGLIDE (NEEDLE)
NS IRRIG 1000ML POUR BTL (IV SOLUTION) ×1 IMPLANT
PACK C SECTION WH (CUSTOM PROCEDURE TRAY) ×1 IMPLANT
PAD OB MATERNITY 4.3X12.25 (PERSONAL CARE ITEMS) ×1 IMPLANT
RTRCTR C-SECT PINK 25CM LRG (MISCELLANEOUS) ×1 IMPLANT
SUT MNCRL 0 VIOLET CTX 36 (SUTURE) ×2 IMPLANT
SUT VIC AB 0 CTX36XBRD ANBCTRL (SUTURE) ×1 IMPLANT
SUT VIC AB 2-0 CT1 TAPERPNT 27 (SUTURE) ×1 IMPLANT
SUT VIC AB 4-0 KS 27 (SUTURE) ×1 IMPLANT
TOWEL OR 17X24 6PK STRL BLUE (TOWEL DISPOSABLE) ×1 IMPLANT
TRAY FOLEY W/BAG SLVR 14FR LF (SET/KITS/TRAYS/PACK) ×1 IMPLANT
WATER STERILE IRR 1000ML POUR (IV SOLUTION) ×1 IMPLANT

## 2023-10-05 NOTE — H&P (Signed)
Obstetric Preoperative History and Physical  Sandy Carter is a 23 y.o. G3P1011 with IUP at [redacted]w[redacted]d presenting for scheduled cesarean section.  Reports good fetal movement, no bleeding, no contractions, no leaking of fluid.  No acute preoperative concerns.    Cesarean Section Indication: elective repeat  Prenatal Course Source of Care: [facility]  with onset of care at 13 weeks Pregnancy complications or risks: Patient Active Problem List   Diagnosis Date Noted   Fetal growth restriction antepartum 09/23/2023   Supervision of high risk pregnancy, antepartum 04/14/2023   Seizure disorder (HCC) 04/14/2023   Depression with anxiety 04/14/2023   History of cesarean section 10/19/2021   History of prior pregnancy with IUGR newborn 10/01/2021   Asymptomatic bacteriuria during pregnancy in first trimester 05/06/2021   Marijuana use 05/06/2021   History of hyperthyroidism 05/01/2021   HNP (herniated nucleus pulposus), lumbar 12/28/2020   Rectal bleeding 08/13/2020   Moderate episode of recurrent major depressive disorder (HCC) 05/10/2019   Arthralgia 04/27/2019   MDD (major depressive disorder), recurrent severe, 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   Prenatal labs and studies: ABO, Rh: O/Positive/-- (08/06 1536) Antibody: Negative (11/13 0831) Rubella: 4.81 (08/06 1536) RPR: Non Reactive (11/13 0831)  HBsAg: Negative (08/06 1536)  HIV: Non Reactive (11/13 0831)  GBS:--/Negative (01/15 0216) 2 hr Glucola  wnl Genetic screening low risk Anatomy US  normal except isolated LV EICF  Prenatal Transfer Tool  Maternal Diabetes: No Genetic Screening: Normal Maternal  Ultrasounds/Referrals: Isolated EIF (echogenic intracardiac focus) Fetal Ultrasounds or other Referrals:  None Maternal Substance Abuse:  Yes:  Type: Marijuana Significant Maternal Medications:  Meds include: Other: Lexapro Significant Maternal Lab Results: Group B Strep negative  Past Medical History:  Diagnosis Date   ADD (attention deficit disorder)     in her teens   Anxiety    Depression    Dysrhythmia    palpitations - from Hyperthyroidism   Headache    Hyperthyroidism    not treated at this time   Lumbar herniated disc    ODD (oppositional defiant disorder)    in her teens   PONV (postoperative nausea and vomiting)    Seizures (HCC)    shakes alot, is aware of what is going on, feels them coming on, has not seen a neurologist   Tuberculosis    before age 22    Past Surgical History:  Procedure Laterality Date   CESAREAN SECTION N/A 10/19/2021   Procedure: CESAREAN SECTION;  Surgeon: Myna Hidalgo, DO;  Location: [facility] LD ORS;  Service: Obstetrics;  Laterality: N/A;   COLONOSCOPY WITH PROPOFOL N/A 09/17/2020   Procedure: COLONOSCOPY WITH PROPOFOL;  Surgeon: Lanelle Bal, DO;  Location: [facility] ENDO SUITE;  Service: Endoscopy;  Laterality: N/A;  2:00pm   LAPAROSCOPIC APPENDECTOMY N/A 10/17/2021   Procedure: APPENDECTOMY LAPAROSCOPIC;  Surgeon: Andria Meuse, MD;  Location: [facility] OR;  Service: General;  Laterality: N/A;   LUMBAR LAMINECTOMY/DECOMPRESSION MICRODISCECTOMY Right 12/28/2020   Procedure: Right Lumbar Five Sacral One Microdiscectomy;  Surgeon: Coletta Memos, MD;  Location: [facility] OR;  Service: Neurosurgery;  Laterality: Right;   OPEN REDUCTION INTERNAL FIXATION (ORIF) METACARPAL Right 12/08/2022   Procedure: OPEN REDUCTION INTERNAL FIXATION (ORIF) METACARPAL;  Surgeon:  Oliver Barre, MD;  Location: [facility] ORS;  Service: Orthopedics;  Laterality: Right;   WISDOM TOOTH EXTRACTION      OB History  Gravida Para Term Preterm AB Living  3 1 1  1 1   SAB IAB Ectopic Multiple  Live Births  1    1    # Outcome Date GA Lbr Len/2nd Weight Sex Type Anes PTL Lv  3 Current           2 SAB 09/2022          1 Term 10/19/21 [redacted]w[redacted]d  2220 g M CS-LTranv Spinal N LIV     Complications: Fetal Intolerance, Fetal growth restriction antepartum    Social History   Socioeconomic History   Marital status: Married    Spouse name: Not on file   Number of children: Not on file   Years of education: Not on file   Highest education level: Not on file  Occupational History   Not on file  Tobacco Use   Smoking status: Former    Types: Cigarettes   Smokeless tobacco: Never  Vaping Use   Vaping status: Former  Substance and Sexual Activity   Alcohol use: Not Currently    Comment: occ   Drug use: Yes    Types: Marijuana    Comment: occasionaly, currently stopped- last time smoked- this mth   Sexual activity: Yes    Birth control/protection: None  Other Topics Concern   Not on file  Social History Narrative   Not on file   Social Drivers of Health   Financial Resource Strain: Patient Declined (04/14/2023)   Overall Financial Resource Strain (CARDIA)    Difficulty of Paying Living Expenses: Patient declined  Food Insecurity: No Food Insecurity (04/14/2023)   Hunger Vital Sign    Worried About Running Out of Food in the Last Year: Never true    Ran Out of Food in the Last Year: Never true  Transportation Needs: No Transportation Needs (04/14/2023)   PRAPARE - Administrator, Civil Service (Medical): No    Lack of Transportation (Non-Medical): No  Physical Activity: Insufficiently Active (04/14/2023)   Exercise Vital Sign    Days of Exercise per Week: 1 day    Minutes of Exercise per Session: 20 min  Stress: Stress Concern Present (04/14/2023)   [employer] of Occupational Health - Occupational Stress Questionnaire    Feeling of Stress : Very much  Social Connections: Moderately Isolated (04/14/2023)   Social Connection and Isolation Panel [NHANES]     Frequency of Communication with Friends and Family: More than three times a week    Frequency of Social Gatherings with Friends and Family: More than three times a week    Attends Religious Services: Never    Database administrator or Organizations: No    Attends Engineer, structural: Never    Marital Status: Married    Family History  Adopted: Yes  Problem Relation Age of Onset   Diabetes Maternal Grandmother    Alcoholism Mother    Bone cancer Mother     Medications Prior to Admission  Medication Sig Dispense Refill Last Dose/Taking   escitalopram (LEXAPRO) 5 MG tablet Take 1 tablet (5 mg total) by mouth daily. 30 tablet 3 Past Week   ferrous sulfate 325 (65 FE) MG tablet Take 1 tablet (325 mg total) by mouth every other day. (Patient taking differently: Take 325 mg by mouth daily.) 45 tablet 2 Past Week  nitrofurantoin, macrocrystal-monohydrate, (MACROBID) 100 MG capsule Take 1 capsule (100 mg total) by mouth at bedtime. Begin this treatment after you complete your course of Bactrim and take one each night until delivery 30 capsule 4 10/04/2023   Blood Pressure Monitor MISC For regular home bp monitoring during pregnancy (Patient not taking: Reported on 09/23/2023) 1 each 0    escitalopram (LEXAPRO) 10 MG tablet 1 tablet daily (Patient not taking: Reported on 09/30/2023) 30 tablet 6 Not Taking   metroNIDAZOLE (FLAGYL) 500 MG tablet Take 1 tablet (500 mg total) by mouth 2 (two) times daily. 14 tablet 0     Allergies  Allergen Reactions   Depo-Provera [Medroxyprogesterone Acetate] Hives, Shortness Of Breath and Cough   Penicillins Rash    Reaction: 5 years   Gabapentin Other (See Comments)    Headaches, lightheaded   Other Nausea And Vomiting    Pt has nausea and vomiting post procedure    Penicillin G Other (See Comments)    Review of Systems: Pertinent items noted in HPI and remainder of comprehensive ROS otherwise negative.  Physical Exam: Ht 5\' 2"  (1.575 m)   Wt  68.5 kg   BMI 27.62 kg/m  FHR by Doppler:  bpm CONSTITUTIONAL: Well-developed, well-nourished female in no acute distress.  HENT:  Normocephalic, atraumatic, External right and left ear normal. Oropharynx is clear and moist EYES: Conjunctivae and EOM are normal. Pupils are equal, round, and reactive to light. No scleral icterus.  NECK: Normal range of motion, supple, no masses SKIN: Skin is warm and dry. No rash noted. Not diaphoretic. No erythema. No pallor. NEUROLOGIC: Alert and oriented to person, place, and time. Normal reflexes, muscle tone coordination. No cranial nerve deficit noted. PSYCHIATRIC: Normal mood and affect. Normal behavior. Normal judgment and thought content. CARDIOVASCULAR: Normal heart rate noted, regular rhythm RESPIRATORY: Effort and breath sounds normal, no problems with respiration noted ABDOMEN: Soft, nontender, nondistended, gravid. Well-healed Pfannenstiel incision. PELVIC: Deferred MUSCULOSKELETAL: Normal range of motion. No edema and no tenderness. 2+ distal pulses.  Pertinent Labs/Studies:   No results found for this or any previous visit (from the past 72 hours).  Assessment and Plan: Sandy Carter is a 23 y.o. G3P1011 at [redacted]w[redacted]d being admitted for scheduled cesarean section. The risks of surgery were discussed with the patient including but were not limited to: bleeding which may require transfusion or reoperation; infection which may require antibiotics; injury to bowel, bladder, ureters or other surrounding organs; injury to the fetus; need for additional procedures including hysterectomy in the event of a life-threatening hemorrhage; formation of adhesions; placental abnormalities wth subsequent pregnancies; incisional problems; thromboembolic phenomenon and other postoperative/anesthesia complications. The patient concurred with the proposed plan, giving informed written consent for the procedure. Patient has been NPO since last night she will remain NPO for  procedure. Anesthesia and OR aware. Preoperative prophylactic antibiotics and SCDs ordered on call to the OR. To OR when ready.    Sundra Aland, MD FMOB Fellow, Faculty practice [facility], Center for [facility] 10/05/23  11:17 AM    Attestation of Attending Supervision of Obstetric Fellow: Evaluation and management procedures were performed by the Obstetric Fellow under my supervision and collaboration.  I have reviewed the Obstetric Fellow's note and chart, and I agree with the management and plan.  Candelaria Celeste, DO Attending Physician Faculty Practice, [facility] [place]

## 2023-10-05 NOTE — Transfer of Care (Signed)
Immediate Anesthesia Transfer of Care Note  Patient: Sandy Carter  Procedure(s) Performed: CESAREAN SECTION  Patient Location: PACU  Anesthesia Type:Spinal  Level of Consciousness: awake  Airway & Oxygen Therapy: Patient Spontanous Breathing  Post-op Assessment: Report given to RN and Post -op Vital signs reviewed and stable  Post vital signs: Reviewed and stable  Last Vitals:  Vitals Value Taken Time  BP 108/83 10/05/23 1508  Temp    Pulse 68 10/05/23 1510  Resp 18 10/05/23 1510  SpO2 98 % 10/05/23 1510  Vitals shown include unfiled device data.  Last Pain:  Vitals:   10/05/23 1119  TempSrc: Oral  PainSc:          Complications: No notable events documented.

## 2023-10-05 NOTE — Progress Notes (Signed)
Seizure pads placed on the bed

## 2023-10-05 NOTE — Lactation Note (Signed)
This note was copied from a baby's chart. Lactation Consultation Note  Patient Name: Sandy Carter [id number] Date: 10/05/2023 Age:24 hours Reason for consult: Initial assessment;Early term 37-38.6wks  P2- repeat C/S, @ 4 hrs of life. Mom chooses to pump and feed by bottle- brought her own wearable pump. Per mom has several ounces pumped and ready for baby. Discussed belly size- 5-10 ml Day 1, 20-30 ml on DC day. Encouraged mom to pump every 3 hours, additional pumping over nights Day 2 & 3 to replicate cluster feeding. LC services and milk storage shared with mom.    Maternal Data Does the patient have breastfeeding experience prior to this delivery?: Yes How long did the patient breastfeed?: Mom desires pumping and feeding by bottle  Feeding Mother's Current Feeding Choice: Breast Milk  LATCH Score Latch: Repeated attempts needed to sustain latch, nipple held in mouth throughout feeding, stimulation needed to elicit sucking reflex.  Audible Swallowing: A few with stimulation  Type of Nipple: Everted at rest and after stimulation  Comfort (Breast/Nipple): Soft / non-tender  Hold (Positioning): Assistance needed to correctly position infant at breast and maintain latch.  LATCH Score: 7   Lactation Tools Discussed/Used Tools: Pump;Hands-free pumping top  Interventions Interventions: DEBP;Education;LC Services brochure (Milk Storage Guidelines)  Discharge Pump: Hands Free;Personal (Mom has her mom cozy wearable with her, encouraged her we could provide a hand pump or DEBP Medela if mom desires)  Consult Status Consult Status: Follow-up Date: 10/06/23 Follow-up type: In-patient    [facility] 10/05/2023, 7:02 PM

## 2023-10-05 NOTE — Anesthesia Postprocedure Evaluation (Signed)
Anesthesia Post Note  Patient: Sandy Carter  Procedure(s) Performed: CESAREAN SECTION     Patient location during evaluation: PACU Anesthesia Type: Spinal Level of consciousness: awake and alert Pain management: pain level controlled Vital Signs Assessment: post-procedure vital signs reviewed and stable Respiratory status: spontaneous breathing, nonlabored ventilation and respiratory function stable Cardiovascular status: blood pressure returned to baseline and stable Postop Assessment: no apparent nausea or vomiting Anesthetic complications: no   No notable events documented.  Last Vitals:  Vitals:   10/05/23 1615 10/05/23 1624  BP:  104/70  Pulse: 75 63  Resp: 19 16  Temp: (!) 36.3 C 36.6 C  SpO2: 98% 99%    Last Pain:  Vitals:   10/05/23 1624  TempSrc: Axillary  PainSc:    Pain Goal:    LLE Motor Response: Purposeful movement (10/05/23 1615) LLE Sensation: Increased (10/05/23 1615) RLE Motor Response: Purposeful movement (10/05/23 1615) RLE Sensation: Increased (10/05/23 1615)     Epidural/Spinal Function Cutaneous sensation: Able to Wiggle Toes (10/05/23 1615), Patient able to flex knees: Yes (10/05/23 1615), Patient able to lift hips off bed: No (10/05/23 1615), Back pain beyond tenderness at insertion site: No (10/05/23 1615), Progressively worsening motor and/or sensory loss: No (10/05/23 1615), Bowel and/or bladder incontinence post epidural: No (10/05/23 1615)  Lowella Curb

## 2023-10-05 NOTE — Discharge Summary (Addendum)
Postpartum Discharge Summary  Date of Service updated***     Patient Name: Sandy Carter DOB: [birth year] MRN: [id number]  Date of admission: 10/05/2023 Delivery date:10/05/2023 Delivering provider: Levie Heritage Date of discharge: 10/05/2023  Admitting diagnosis: Pregnancy [Z34.90] Intrauterine pregnancy: [redacted]w[redacted]d     Secondary diagnosis:  Principal Problem:   S/P cesarean section Active Problems:   Pregnancy  Additional problems: ***    Discharge diagnosis: Term Pregnancy Delivered                                              Post partum procedures:{Postpartum procedures:23558} Augmentation: N/A Complications: {OB Labor/Delivery Complications:20784}  Hospital course: Scheduled C/S   23 y.o. yo G3P1011 at [redacted]w[redacted]d was admitted to the hospital 10/05/2023 for scheduled cesarean section with the following indication:Elective Repeat.Delivery details are as follows:  Membrane Rupture Time/Date: 2:22 PM,10/05/2023  Delivery Method:C-Section, Low Transverse Operative Delivery:N/A Details of operation can be found in separate operative note.  Patient had a postpartum course complicated by***.  She is ambulating, tolerating a regular diet, passing flatus, and urinating well. Patient is discharged home in stable condition on  10/05/23        Newborn Data: Birth date:10/05/2023 Birth time:2:23 PM Gender:Female Living status:Living Apgars:9 ,9  Weight:3000 g    Magnesium Sulfate received: {Mag received:30440022} BMZ received: No Rhophylac:N/A MMR:N/A T-DaP:Given prenatally Flu: Yes RSV Vaccine received: No Transfusion:{Transfusion received:30440034}  Immunizations received: Immunization History  Administered Date(s) Administered   Influenza, Seasonal, Injecte, Preservative Fre 07/22/2023   Influenza,inj,Quad PF,6+ Mos 06/11/2014, 05/28/2021   Tdap 08/16/2021, 07/22/2023    Physical exam  Vitals:   10/05/23 1103 10/05/23 1119  BP:  121/84  Pulse:  85  Resp:  18  Temp:   98 F (36.7 C)  TempSrc:  Oral  SpO2:  100%  Weight: 68.5 kg   Height: 5\' 2"  (1.575 m)    General: {Exam; general:21111117} Lochia: {Desc; appropriate/inappropriate:30686::"appropriate"} Uterine Fundus: {Desc; firm/soft:30687} Incision: {Exam; incision:21111123} DVT Evaluation: {Exam; dvt:2111122} Labs: Lab Results  Component Value Date   WBC 9.3 10/05/2023   HGB 11.2 (L) 10/05/2023   HCT 34.0 (L) 10/05/2023   MCV 89.5 10/05/2023   PLT 268 10/05/2023      Latest Ref Rng & Units 05/31/2023    5:57 PM  CMP  Glucose 70 - 99 mg/dL 97   BUN 6 - 20 mg/dL 9   Creatinine 4.09 - 8.11 mg/dL 9.14   Sodium 782 - 956 mmol/L 135   Potassium 3.5 - 5.1 mmol/L 3.5   Chloride 98 - 111 mmol/L 101   CO2 22 - 32 mmol/L 24   Calcium 8.9 - 10.3 mg/dL 8.8   Total Protein 6.5 - 8.1 g/dL 7.8   Total Bilirubin 0.3 - 1.2 mg/dL 0.6   Alkaline Phos 38 - 126 U/L 76   AST 15 - 41 U/L 16   ALT 0 - 44 U/L 9    Edinburgh Score:    12/24/2021    8:55 AM  Edinburgh Postnatal Depression Scale Screening Tool  I have been able to laugh and see the funny side of things. 0  I have looked forward with enjoyment to things. 0  I have blamed myself unnecessarily when things went wrong. 0  I have been anxious or worried for no good reason. 2  I have felt scared or panicky  for no good reason. 0  Things have been getting on top of me. 1  I have been so unhappy that I have had difficulty sleeping. 0  I have felt sad or miserable. 2  I have been so unhappy that I have been crying. 1  The thought of harming myself has occurred to me. 0  Edinburgh Postnatal Depression Scale Total 6   No data recorded  After visit meds:  Allergies as of 10/05/2023       Reactions   Depo-provera [medroxyprogesterone Acetate] Hives, Shortness Of Breath, Cough   Penicillins Rash   Reaction: 5 years   Gabapentin Other (See Comments)   Headaches, lightheaded   Other Nausea And Vomiting   Pt has nausea and vomiting post  procedure    Penicillin G Other (See Comments)     Med Rec must be completed prior to using this SMARTLINK***        Discharge home in stable condition Infant Feeding: {Baby feeding:23562} Infant Disposition:{CHL IP OB HOME WITH UEAVWU:98119} Discharge instruction: per After Visit Summary and Postpartum booklet. Activity: Advance as tolerated. Pelvic rest for 6 weeks.  Diet: {OB JYNW:29562130} Future Appointments:No future appointments. Follow up Visit: Message sent to FT 1/27  Please schedule this patient for a In person postpartum visit in 4 weeks with the following provider: Any provider. Additional Postpartum F/U:Postpartum Depression checkup and Incision check 1 week  High risk pregnancy complicated by:  FGR, MDD/GAD, seizure disorder Delivery mode:  C-Section, Low Transverse Anticipated Birth Control:  Unsure   10/05/2023 Sundra Aland, MD

## 2023-10-05 NOTE — Op Note (Signed)
Sandy Carter PROCEDURE DATE: 10/05/2023  PREOPERATIVE DIAGNOSIS: Intrauterine pregnancy at  [redacted]w[redacted]d weeks gestation;  elective repeat  POSTOPERATIVE DIAGNOSIS: The same  PROCEDURE: Repeat Low Transverse Cesarean Section  SURGEON:  Dr. Candelaria Celeste  ASSISTANT: Sundra Aland, MD    INDICATIONS: Sandy Carter is a 23 y.o. G3P1011 at [redacted]w[redacted]d scheduled for cesarean section secondary to  elective repeat .  The risks of cesarean section discussed with the patient included but were not limited to: bleeding which may require transfusion or reoperation; infection which may require antibiotics; injury to bowel, bladder, ureters or other surrounding organs; injury to the fetus; need for additional procedures including hysterectomy in the event of a life-threatening hemorrhage; placental abnormalities wth subsequent pregnancies, incisional problems, thromboembolic phenomenon and other postoperative/anesthesia complications. The patient concurred with the proposed plan, giving informed written consent for the procedure.    FINDINGS:  Viable female infant in cephalic presentation.  Apgars 9 and 9, weight, 3000 grams.  Clear amniotic fluid.  Intact placenta, three vessel cord.  Normal uterus, fallopian tubes and ovaries bilaterally.  ANESTHESIA:    Spinal INTRAVENOUS FLUIDS:1800 ml ESTIMATED BLOOD LOSS: 182 ml URINE OUTPUT:  250 ml SPECIMENS: Placenta sent to L&D COMPLICATIONS: None immediate  PROCEDURE IN DETAIL:  The patient received intravenous antibiotics and had sequential compression devices applied to her lower extremities while in the preoperative area.  She was then taken to the operating room where spinal anesthesia was administered and was found to be adequate. She was then placed in a dorsal supine position with a leftward tilt, and prepped and draped in a sterile manner.  A foley catheter was placed into her bladder and attached to constant gravity, which drained clear urine throughout.   After an adequate timeout was performed, a Pfannenstiel skin incision was made with scalpel over previous incision and carried through to the underlying layer of fascia. The fascia was incised in the midline and this incision was extended bluntly. Kocher clamps were applied to the superior aspect of the fascial incision and the underlying rectus muscles were dissected off bluntly. The rectus muscles were separated in the midline bluntly and the peritoneum was entered bluntly. An Alexis retractor was placed to aid in visualization of the uterus. The vesicouterine peritoneum was identified, tented up and entered with the metzenbaum scissors. This incision was extended laterally and the bladder flap was created digitally. Attention was turned to the lower uterine segment where a transverse hysterotomy was made with a scalpel and extended bilaterally bluntly. The infant was successfully delivered, and cord was clamped and cut and infant was handed over to awaiting neonatology team. Uterine massage was then administered and the placenta delivered intact with three-vessel cord. The uterus was then cleared of clot and debris.  The hysterotomy was closed with 0 Monocryl in a running locked fashion, and an imbricating layer was also placed with a 0 Monocryl. Overall, excellent hemostasis was noted. The abdomen and the pelvis were cleared of all clot and debris and the Jon Gills was removed. Hemostasis was confirmed on all surfaces.  The peritoneum was reapproximated using 2-0 Vicryl running stitches. The fascia was then closed using 0 Vicryl in a running fashion. The subcutaneous layer was irrigated, then any bleeding areas were cauterized using the Bovie, excellent hemostasis was then noted. The skin was closed with 4-0 Vicryl. The patient tolerated the procedure well. Sponge, lap, instrument and needle counts were correct x 2. She was taken to the recovery room in stable condition.  Sundra Aland, MD 10/05/2023 3:05  PM

## 2023-10-05 NOTE — Anesthesia Procedure Notes (Signed)
Spinal  Patient location during procedure: OB Start time: 10/05/2023 1:58 PM End time: 10/05/2023 2:03 PM Reason for block: surgical anesthesia Staffing Performed: anesthesiologist  Anesthesiologist: Lowella Curb, MD Performed by: Lowella Curb, MD Authorized by: Lowella Curb, MD   Preanesthetic Checklist Completed: patient identified, IV checked, risks and benefits discussed, surgical consent, monitors and equipment checked, pre-op evaluation and timeout performed Spinal Block Patient position: sitting Prep: DuraPrep and site prepped and draped Patient monitoring: heart rate, cardiac monitor, continuous pulse ox and blood pressure Approach: midline Location: L3-4 Injection technique: single-shot Needle Needle type: Pencan  Needle gauge: 24 G Needle length: 10 cm Assessment Sensory level: T4 Events: CSF return

## 2023-10-05 NOTE — Anesthesia Preprocedure Evaluation (Signed)
Anesthesia Evaluation  Patient identified by MRN, date of birth, ID band Patient awake    Reviewed: Allergy & Precautions, NPO status , Patient's Chart, lab work & pertinent test results  History of Anesthesia Complications (+) PONV and history of anesthetic complications  Airway Mallampati: II  TM Distance: >3 FB Neck ROM: Full    Dental no notable dental hx. (+) Teeth Intact, Dental Advisory Given   Pulmonary neg pulmonary ROS, Patient abstained from smoking., former smoker   Pulmonary exam normal breath sounds clear to auscultation       Cardiovascular Exercise Tolerance: Good Normal cardiovascular exam+ dysrhythmias (palpitations)  Rhythm:Regular Rate:Normal     Neuro/Psych  Headaches PSYCHIATRIC DISORDERS (ODD) Anxiety Depression       GI/Hepatic negative GI ROS,,,(+)     substance abuse  marijuana use  Endo/Other   Hyperthyroidism   Renal/GU negative Renal ROS  negative genitourinary   Musculoskeletal S/P L5S1 microdiscectomy   Abdominal   Peds negative pediatric ROS (+)  Hematology  (+) Blood dyscrasia, anemia Lab Results      Component                Value               Date                      WBC                      10.6 (H)            10/19/2021                HGB                      9.3 (L)             10/19/2021                HCT                      28.0 (L)            10/19/2021                MCV                      88.1                10/19/2021                PLT                      235                 10/19/2021              Anesthesia Other Findings S/P appy 10/17/21 w GA   Reproductive/Obstetrics (+) Pregnancy (36 weeks 5 days)                             Anesthesia Physical Anesthesia Plan  ASA: 2  Anesthesia Plan: Spinal   Post-op Pain Management:    Induction:   PONV Risk Score and Plan: 3 and Treatment may vary due to age or medical  condition  Airway Management Planned: Natural Airway  Additional Equipment: None  Intra-op Plan:   Post-operative Plan:  Informed Consent: I have reviewed the patients History and Physical, chart, labs and discussed the procedure including the risks, benefits and alternatives for the proposed anesthesia with the patient or authorized representative who has indicated his/her understanding and acceptance.       Plan Discussed with: Anesthesiologist and CRNA  Anesthesia Plan Comments:         Anesthesia Quick Evaluation

## 2023-10-06 ENCOUNTER — Encounter (HOSPITAL_COMMUNITY): Payer: Self-pay | Admitting: Family Medicine

## 2023-10-06 LAB — CBC
HCT: 28.2 % — ABNORMAL LOW (ref 36.0–46.0)
Hemoglobin: 9.9 g/dL — ABNORMAL LOW (ref 12.0–15.0)
MCH: 30.9 pg (ref 26.0–34.0)
MCHC: 35.1 g/dL (ref 30.0–36.0)
MCV: 88.1 fL (ref 80.0–100.0)
Platelets: 260 10*3/uL (ref 150–400)
RBC: 3.2 MIL/uL — ABNORMAL LOW (ref 3.87–5.11)
RDW: 13.4 % (ref 11.5–15.5)
WBC: 16.1 10*3/uL — ABNORMAL HIGH (ref 4.0–10.5)
nRBC: 0 % (ref 0.0–0.2)

## 2023-10-06 LAB — RPR: RPR Ser Ql: NONREACTIVE

## 2023-10-06 NOTE — Progress Notes (Signed)
POSTPARTUM PROGRESS NOTE  Post Op Day 1 Subjective:  Sandy Carter is a 23 y.o. E4V4098 [redacted]w[redacted]d s/p rltcs3.  No acute events overnight.  Pt denies problems with ambulating, voiding or po intake.  She denies nausea or vomiting.  Pain is moderately controlled.  She has had flatus. She has not had bowel movement.  Lochia Small.   Objective: Blood pressure (!) 110/55, pulse 69, temperature 98.4 F (36.9 C), resp. rate 16, height 5\' 2"  (1.575 m), weight 68.5 kg, SpO2 99%, unknown if currently breastfeeding.  Physical Exam:  General: alert, cooperative and no distress Lochia:normal flow Chest: CTAB Heart: RRR no m/r/g Abdomen: +BS, soft, nontender,  Uterine Fundus: firm,  DVT Evaluation: No calf swelling or tenderness Extremities: no edema  Recent Labs    10/05/23 1047 10/06/23 0431  HGB 11.2* 9.9*  HCT 34.0* 28.2*    Assessment/Plan:  ASSESSMENT: Sandy Carter is a 23 y.o. J1B1478 [redacted]w[redacted]d s/p rltcs, doing well, breastfeeding, pain controlled, post-op hgb appropriate no sig anemia, undecided regarding contraception (counseling provided).   Plan for discharge tomorrow   LOS: 1 day   Silvano Bilis 10/06/2023, 9:08 AM

## 2023-10-06 NOTE — Lactation Note (Signed)
This note was copied from a baby's chart. Lactation Consultation Note  Patient Name: Sandy Carter [id number] Date: 10/06/2023 Age:24 hours Reason for consult: Follow-up assessment;Early term 54-38.6wks  P2 mom of 22 hour old infant consulted. Mom reports that she has been placing baby to the breast successfully but is concerned if she is providing infant with enough to eat. LC listened to mother's concerns with using her personal hands free Mom cozy pump and the manual pump with minimal yield. Mom informed LC that she expressed "3 ounces" of milk prior to birth using the momcozy but is now expressing drops or minimal yield.  LC provided guidance on normalcy of milk production at 22 hours PP post-csection. Discussed continuing to offer infant the breast on cue and to hand express after for additional stimulation as needed. Reviewed that we could set up a DEBP if she feels the suction and settings may benefit her most. Mom desires to use her Momcozy. Provided reassurance on breastfeeding and milk production including signs of infant satiety. Infant is sound asleep in the bassinet. No signs of hunger present during this visit. Encouraged mom and discussed if she'd like assistance with observing a feed to call out for help via call light.   Maternal Data Has patient been taught Hand Expression?: Yes  Feeding Mother's Current Feeding Choice: Breast Milk  Interventions Interventions: Breast feeding basics reviewed;Hand express;Education  Discharge    Consult Status Consult Status: Follow-up Date: 10/07/23 Follow-up type: In-patient    Su Grand 10/06/2023, 12:40 PM
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
## 2023-10-06 NOTE — Lactation Note (Signed)
This note was copied from a baby's chart. Lactation Consultation Note  Patient Name: Sandy Carter [id number] Date: 10/06/2023 Age:24 hours Reason for consult: Follow-up assessment;Mother's request;Early term 37-38.6wks;Infant weight loss;Maternal endocrine disorder;Breastfeeding assistance;RN request  P2- RN requested LC's assistance with latching infant. [name] reported that infant started cluster feeding a while ago, but now has had a long period of not wanting to eat. Infant was actively cueing and trying to latch at this time. LC assisted [name] with placing infant on the right breast in the cross cradle hold. Infant latched immediately and had a strong rhythmic suck. [name] complained of pain. LC could not note a reason she should be having nipple pain. LC offered to take infant off of the breast, but [name] declined and stated that infant just has a strong suck. [name] denied having further questions or concerns. LC encouraged [name] to call for further assistance as needed.  Maternal Data Has patient been taught Hand Expression?: Yes Does the patient have breastfeeding experience prior to this delivery?: Yes How long did the patient breastfeed?: 1 month  Feeding Mother's Current Feeding Choice: Breast Milk  LATCH Score Latch: Grasps breast easily, tongue down, lips flanged, rhythmical sucking.  Audible Swallowing: A few with stimulation  Type of Nipple: Everted at rest and after stimulation  Comfort (Breast/Nipple): Filling, red/small blisters or bruises, mild/mod discomfort  Hold (Positioning): Assistance needed to correctly position infant at breast and maintain latch.  LATCH Score: 7   Lactation Tools Discussed/Used Pump Education: Milk Storage  Interventions Interventions: Breast feeding basics reviewed;Assisted with latch;Breast compression;Adjust position;Support pillows;Position options;Education;LC Services brochure  Discharge Discharge Education: Warning signs for feeding  baby Pump: Hands Free;Personal  Consult Status Consult Status: Follow-up Date: 10/07/23 Follow-up type: In-patient    Dema Severin BS, IBCLC 10/06/2023, 6:38 PM

## 2023-10-07 ENCOUNTER — Inpatient Hospital Stay (HOSPITAL_COMMUNITY): Payer: MEDICAID

## 2023-10-07 NOTE — Progress Notes (Signed)
POSTPARTUM PROGRESS NOTE  POD #2  Subjective:  Sandy Carter is a 23 y.o. [id number] s/p rLTCS at [redacted]w[redacted]d.  No acute events overnight. She reports she is doing ok. She denies any problems with ambulating, voiding, or po intake. Denies vision changes, nausea or vomiting. She has passed flatus. Pain is moderately controlled.  She has mild bilateral temporal and occipital headache, unsure of current medications alleviate pain. Reports pain localized to left segment of incision site. Lochia is menses-like, decreasing in quantity without clots passed. Mood is irritable, attributes it to lack of sleep.  Objective: BP 119/88 (BP Location: Right Arm)   Pulse 76   Temp 98.1 F (36.7 C) (Oral)   Resp 17   Ht 5\' 2"  (1.575 m)   Wt 68.5 kg   SpO2 99%   Breastfeeding Unknown   BMI 27.62 kg/m   Physical Exam:  General: alert, cooperative and no distress Chest: no respiratory distress Heart: regular rate and rhythm Abdomen: soft, mildly tender to palpation in RUQ Uterine Fundus: firm, appropriately tender DVT Evaluation: no calf swelling or tenderness Extremities: no lower extremity edema Skin: warm, dry; incision clean/dry/intact w/ honeycomb dressing in place  Recent Labs    10/05/23 1047 10/06/23 0431  HGB 11.2* 9.9*  HCT 34.0* 28.2*    Assessment/Plan: Sandy Carter is a 23 y.o. [id number] s/p rLCTS at [redacted]w[redacted]d for FGR.  POD#2 - Doing welll; pain moderately controlled. H/H appropriate  Routine postpartum care  OOB, ambulated  Lovenox for VTE prophylaxis Anemia: asymptomatic  Start po ferrous sulfate BID Contraception: undecided Feeding: breastfeeding  Dispo: Plan for discharge POD#3.   LOS: 2 days   Estrella Deeds, Medical Student 10/07/2023, 7:05 AM

## 2023-10-08 ENCOUNTER — Inpatient Hospital Stay (HOSPITAL_COMMUNITY)
Admission: AD | Admit: 2023-10-08 | Discharge: 2023-10-08 | Disposition: A | Discharge status: Home / Self Care | Payer: MEDICAID | Attending: Family Medicine | Admitting: Family Medicine

## 2023-10-08 ENCOUNTER — Other Ambulatory Visit: Payer: MEDICAID

## 2023-10-08 ENCOUNTER — Encounter (HOSPITAL_COMMUNITY): Payer: Self-pay | Admitting: Family Medicine

## 2023-10-08 DIAGNOSIS — R519 Headache, unspecified: Secondary | ICD-10-CM | POA: Insufficient documentation

## 2023-10-08 DIAGNOSIS — O909 Complication of the puerperium, unspecified: Secondary | ICD-10-CM | POA: Insufficient documentation

## 2023-10-08 DIAGNOSIS — O9089 Other complications of the puerperium, not elsewhere classified: Secondary | ICD-10-CM | POA: Diagnosis not present

## 2023-10-08 DIAGNOSIS — G971 Other reaction to spinal and lumbar puncture: Secondary | ICD-10-CM | POA: Diagnosis not present

## 2023-10-08 MED ORDER — IBUPROFEN 800 MG PO TABS: 800.0000 mg | ORAL_TABLET | Freq: Three times a day (TID) | ORAL | 0 refills | Status: DC

## 2023-10-08 MED ORDER — CYCLOBENZAPRINE HCL 10 MG PO TABS: 10.0000 mg | ORAL_TABLET | Freq: Three times a day (TID) | ORAL | 0 refills | Status: DC | PRN

## 2023-10-08 MED ORDER — SENNOSIDES-DOCUSATE SODIUM 8.6-50 MG PO TABS: 2.0000 | ORAL_TABLET | Freq: Every day | ORAL | 1 refills | Status: DC

## 2023-10-08 MED ORDER — ESCITALOPRAM OXALATE 5 MG PO TABS: 5.0000 mg | ORAL_TABLET | Freq: Every day | ORAL | 1 refills | Status: DC

## 2023-10-08 MED ORDER — OXYCODONE HCL 5 MG PO TABS: 5.0000 mg | ORAL_TABLET | Freq: Four times a day (QID) | ORAL | 0 refills | Status: AC | PRN

## 2023-10-08 MED ORDER — NAPROXEN 250 MG PO TABS
500.0000 mg | ORAL_TABLET | Freq: Once | ORAL | Status: AC
  Administered 2023-10-08: 500 mg via ORAL
  Filled 2023-10-08: qty 2

## 2023-10-08 MED ORDER — CAFFEINE 200 MG PO TABS
200.0000 mg | ORAL_TABLET | Freq: Once | ORAL | Status: AC
  Administered 2023-10-08: 200 mg via ORAL
  Filled 2023-10-08: qty 1

## 2023-10-08 MED ORDER — ESCITALOPRAM OXALATE 5 MG PO TABS: 15.0000 mg | ORAL_TABLET | Freq: Every day | ORAL | 1 refills | Status: DC

## 2023-10-08 MED ORDER — PRENATAL MULTIVITAMIN CH: 1.0000 | ORAL_TABLET | Freq: Every day | ORAL | 1 refills | Status: DC

## 2023-10-08 MED ORDER — ACETAMINOPHEN-CAFFEINE 500-65 MG PO TABS
2.0000 | ORAL_TABLET | Freq: Once | ORAL | Status: DC
  Filled 2023-10-08: qty 2

## 2023-10-08 MED ORDER — CYCLOBENZAPRINE HCL 5 MG PO TABS
10.0000 mg | ORAL_TABLET | Freq: Once | ORAL | Status: AC
  Administered 2023-10-08: 10 mg via ORAL
  Filled 2023-10-08: qty 2

## 2023-10-08 NOTE — MAU Note (Signed)
Dr Earlene Plater in and put on new honeycomb dsg for pt. Covered that with ABD dsg so pt did not have to see thru the honeycomb dsg.
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
## 2023-10-08 NOTE — Progress Notes (Incomplete)
Subjective: Postoperative Day 3: Cesarean Delivery Patient reports H/A with standing up "straight up" while ambulating to BR with MBU RN. No H/A reported in any other position. No dizziness or blurry vision.  Objective: Vital signs in last 24 hours: Temp:  [98.2 F (36.8 C)-98.3 F (36.8 C)] 98.2 F (36.8 C) (01/29 2141) Pulse Rate:  [73-89] 73 (01/30 0520) Resp:  [18-19] 18 (01/30 0520) BP: (109-125)/(80-84) 109/82 (01/30 0520) SpO2:  [99 %] 99 % (01/30 0520)  Physical Exam:  General: alert, cooperative, and mild distress Assist up to BR  Recent Labs    10/05/23 1047 10/06/23 0431  HGB 11.2* 9.9*  HCT 34.0* 28.2*    Assessment/Plan: Status post Cesarean section. Postoperative course complicated by H/A with standing, but not now   {continue current care/discharge:30691}.  Raelyn Mora, CNM 10/08/2023, 10:40 AM

## 2023-10-08 NOTE — MAU Note (Signed)
.  Sandy Carter is a 23 y.o. at Unknown here in MAU reporting having a repeat c/s Monday and just went home a few hours ago. Pt was home and got up to go to the bathroom. She looked down and her incision was soaked. PT still has honeycomb dsg and can see blood thru it. Has a h/a which is more uncomfortable than her incision. Has not had a chance to take any pain medication   Onset of complaint: tonight Pain score: 8 Vitals:   10/08/23 2053 10/08/23 2054  BP:  121/84  Pulse: 85   Resp: 18   Temp: 98.1 F (36.7 C)   SpO2: 99%      FHT: n/a  Lab orders placed from triage: none

## 2023-10-12 ENCOUNTER — Encounter: Payer: MEDICAID | Admitting: Women's Health

## 2023-10-12 ENCOUNTER — Encounter: Payer: Self-pay | Admitting: Women's Health

## 2023-10-13 ENCOUNTER — Encounter: Payer: Self-pay | Admitting: Women's Health

## 2023-10-13 ENCOUNTER — Ambulatory Visit (INDEPENDENT_AMBULATORY_CARE_PROVIDER_SITE_OTHER): Payer: MEDICAID | Admitting: Women's Health

## 2023-10-13 VITALS — BP 128/80 | HR 91 | Ht 62.0 in | Wt 141.0 lb

## 2023-10-13 DIAGNOSIS — Z4889 Encounter for other specified surgical aftercare: Secondary | ICD-10-CM

## 2023-10-13 DIAGNOSIS — F418 Other specified anxiety disorders: Secondary | ICD-10-CM

## 2023-10-13 NOTE — Progress Notes (Signed)
 GYN VISIT Patient name: Sandy Carter MRN [id number]  Date of birth: March 01, 2001 Chief Complaint:   Routine Post Op  History of Present Illness:   Sandy Carter is a 23 y.o. [phone] Caucasian female 8d s/p RCS being seen today for incision and mood check. EPDS 12. Was on lexapro  10mg  during pregnancy, increased to 15mg  in Dec (but never started this dosage), rx'd 15mg  (take 3 of the 5mg  tablets) on 1/30, but when picked them up didn't realize she was supposed to take 3, so has only been taking 1 daily. Does feel like she has depression. FOB left recently. Does have a support person that can help some (few days a week). Not eating great b/c food is downstairs and hurts to walk downstairs. Sleeps when she can. Denies SI/HI/II. Ok w/ IBH referral.  Incision hurts, states oxycodone  didn't help this am. Had to return to MAU on day of d/c d/t drainage. Took dressing off again couple of days ago and had a lot of drainage, but now only a little. Denies fever/chills.  Breastfeeding, formula only occasionally No LMP recorded.    07/22/2023    8:50 AM 04/14/2023    2:24 PM 08/16/2021    9:48 AM 05/01/2021    3:16 PM 03/13/2021    1:54 PM  Depression screen PHQ 2/9  Decreased Interest 0 3 0 0 1  Down, Depressed, Hopeless 1 3 1  0 2  PHQ - 2 Score 1 6 1  0 3  Altered sleeping 2 2 1 3 3   Tired, decreased energy 2 3 2 3 3   Change in appetite 0 2 2 0 0  Feeling bad or failure about yourself  0 2 0 0 0  Trouble concentrating 2 3 2 3  0  Moving slowly or fidgety/restless 0 0 0 0 0  Suicidal thoughts 0 0 0 0 0  PHQ-9 Score 7 18 8 9 9         04/14/2023    2:24 PM 08/16/2021    9:49 AM 05/01/2021    3:17 PM 03/13/2021    1:54 PM  GAD 7 : Generalized Anxiety Score  Nervous, Anxious, on Edge 3 2 3 2   Control/stop worrying 3 1 3 3   Worry too much - different things 3 3 2 2   Trouble relaxing 3 1 3  0  Restless 1 0 0 0  Easily annoyed or irritable 3 1 1 2   Afraid - awful might happen 2 0 1 0  Total GAD 7  Score 18 8 13 9      Review of Systems:   Pertinent items are noted in HPI Denies fever/chills, dizziness, headaches, visual disturbances, fatigue, shortness of breath, chest pain, abdominal pain, vomiting, abnormal vaginal discharge/itching/odor/irritation, problems with periods, bowel movements, urination, or intercourse unless otherwise stated above.  Pertinent History Reviewed:  Reviewed past medical,surgical, social, obstetrical and family history.  Reviewed problem list, medications and allergies. Physical Assessment:   Vitals:   10/13/23 1033 10/13/23 1036 10/13/23 1105  BP: (!) 136/90 (!) 135/92 128/80  Pulse: (!) 104 91   Weight: 141 lb (64 kg)    Height: 5' 2 (1.575 m)    Body mass index is 25.79 kg/m.       Physical Examination:   General appearance: alert, well appearing, and in no distress  Mental status: alert, oriented to person, place, and time  Skin: warm & dry   Cardiovascular: normal heart rate noted  Respiratory: normal respiratory effort, no distress  Abdomen:  soft, non-tender, c/s incision healing well, small spot in center where dermabond has pulled up slightly and incision is open underneath. No abnormal drainage (small amt brownish drainage on paper towel), odor, no erythema/induration. Slightly painful to touch   Pelvic: examination not indicated  Extremities: no edema   Chaperone: N/A    No results found for this or any previous visit (from the past 24 hours).  Assessment & Plan:  1) 8d s/p RCS> breast & bottlefeeding- tips given  2) Incision check> healing well, keep clean/dry, can keep pad against incision and pants/underwear on top to help. Can take ibuprofen /apap together, heating/cool to help w/ pain. Reviewed warning s/s, reasons to seek care  3) Dep/anx & situational stress> start taking 15mg  lexapro  as rx'd, discussed can take few weeks to notice improvement. Has support person few times/wk. IBH referral ordered  4) BP initially elevated>  repeat wnl, no h/o HTN  Meds: No orders of the defined types were placed in this encounter.   Orders Placed This Encounter  Procedures   Amb ref to Integrated Behavioral Health    Return for As scheduled.  Suzen JONELLE Fetters CNM, [facility] 10/13/2023 11:33 AM

## 2023-10-13 NOTE — Patient Instructions (Signed)
 Tips To Increase Milk Supply Lots of water! Enough so that your urine is clear Plenty of calories, if you're not getting enough calories, your milk supply can decrease Breastfeed/pump often, every 2-3 hours x 20-33mins Fenugreek 3 pills 3 times a day, this may make your urine smell like maple syrup Mother's Milk Tea Lactation cookies, google for the recipe Real oatmeal Body Armor sports drinks Liquid Gold Greater Than hydration drink

## 2023-11-02 ENCOUNTER — Encounter: Payer: Self-pay | Admitting: Obstetrics & Gynecology
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
## 2023-11-11 ENCOUNTER — Ambulatory Visit: Payer: MEDICAID | Admitting: Advanced Practice Midwife

## 2023-11-11 ENCOUNTER — Encounter: Payer: Self-pay | Admitting: Women's Health

## 2023-11-19 ENCOUNTER — Encounter: Payer: MEDICAID | Admitting: Obstetrics & Gynecology

## 2023-11-23 ENCOUNTER — Ambulatory Visit: Payer: MEDICAID | Admitting: Clinical

## 2023-11-23 DIAGNOSIS — F331 Major depressive disorder, recurrent, moderate: Secondary | ICD-10-CM

## 2023-11-23 DIAGNOSIS — Z658 Other specified problems related to psychosocial circumstances: Secondary | ICD-10-CM

## 2023-11-23 DIAGNOSIS — F419 Anxiety disorder, unspecified: Secondary | ICD-10-CM

## 2023-11-23 NOTE — Patient Instructions (Signed)
 Center for [facility] at [facility] [address] [place], [place] [address] [phone] (main office) [phone] [facility] office)  New Parent Support Groups www.[email or web address].net [email or web address]                    Parenting Resources   [employer].[email or web address]   World Association for Infant Mental Health www.waimh.org  Zero to Three www.zerotothree.[employer] for [employer] www.bootcampfornewdads.org   Circle of Security http://knight-sullivan.biz/   Happiest Baby on the Block www.happiestbaby.[email or web address]

## 2023-11-26 ENCOUNTER — Encounter: Payer: Self-pay | Admitting: Women's Health

## 2023-11-26 ENCOUNTER — Telehealth: Payer: MEDICAID | Admitting: Women's Health

## 2023-11-26 DIAGNOSIS — Z8759 Personal history of other complications of pregnancy, childbirth and the puerperium: Secondary | ICD-10-CM

## 2023-11-26 DIAGNOSIS — F53 Postpartum depression: Secondary | ICD-10-CM | POA: Insufficient documentation

## 2023-11-26 DIAGNOSIS — F418 Other specified anxiety disorders: Secondary | ICD-10-CM

## 2023-11-26 DIAGNOSIS — Z98891 History of uterine scar from previous surgery: Secondary | ICD-10-CM

## 2023-11-26 DIAGNOSIS — Z30018 Encounter for initial prescription of other contraceptives: Secondary | ICD-10-CM

## 2023-11-26 DIAGNOSIS — Z8639 Personal history of other endocrine, nutritional and metabolic disease: Secondary | ICD-10-CM

## 2023-11-26 MED ORDER — PHEXXI 1.8-1-0.4 % VA GEL: 1.0000 | Freq: Once | VAGINAL | 11 refills | Status: AC

## 2023-11-26 MED ORDER — ESCITALOPRAM OXALATE 5 MG PO TABS: 5.0000 mg | ORAL_TABLET | Freq: Every day | ORAL | 6 refills | Status: AC

## 2023-11-26 MED ORDER — ESCITALOPRAM OXALATE 10 MG PO TABS: 10.0000 mg | ORAL_TABLET | Freq: Every day | ORAL | 6 refills | Status: AC

## 2023-11-26 NOTE — Progress Notes (Signed)
 TELEHEALTH VIRTUAL POSTPARTUM VISIT ENCOUNTER NOTE Patient name: Sandy Carter MRN [id number]  Date of birth: 2000/09/30  I connected with patient on 11/26/23 at  2:10 PM EDT by MyChart video and verified that I am speaking with the correct person using two identifiers. Pt is not currently in our office, she is at home.  The provider is in the office.    I discussed the limitations, risks, security and privacy concerns of performing an evaluation and management service by telephone and the availability of in person appointments. I also discussed with the patient that there may be a patient responsible charge related to this service. The patient expressed understanding and agreed to proceed.  Chief Complaint:   Postpartum Care  History of Present Illness:   Sandy Carter is a 23 y.o. G35P2012 Caucasian female being seen today for a postpartum visit. She is 7 weeks postpartum following a repeat cesarean section, low transverse incision at 38.3 gestational weeks d/t FGR. IOL: no, for n/a. Anesthesia: spinal.  Laceration: none.  Complications: none. Inpatient contraception: no.   Pregnancy complicated by FGR . Tobacco use: no. Substance use disorder: no. Last pap smear: 05/01/21 and results were NILM w/ HRHPV not done. Next pap smear due: Aug 2025 No LMP recorded.  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 Bleeding none. Bowel function is normal. Bladder function is normal. Urinary incontinence? no, fecal incontinence? no Patient is sexually active. Last sexual activity:  did not discuss . Desired contraception:  condoms sometimes, discussed options, wants to try Phexxi . Patient does not know want a pregnancy in the future.  Desired family size is uncertain #of children.   Upstream - 11/26/23 1416       Pregnancy Intention Screening   Does the patient want to become pregnant in the next year? No    Does the patient's partner want to become pregnant in the next year? No    Would  the patient like to discuss contraceptive options today? Yes      Contraception Wrap Up   Current Method Female Condom    Contraception Counseling Provided Yes            The pregnancy intention screening data noted above was reviewed. Potential methods of contraception were discussed. The patient elected to proceed with No data recorded.  Edinburgh Postpartum Depression Screening: positive, had appt w/ Jamie/IBH 3/17, just found out husband has been cheating, some occ thoughts of harming self, states she would never act on thoughts b/c she needs to be here for her children. No plan of harming self. Has been on lexapro 15mg  in past, worked well, wants to restart. Has therapist w/ PCP, they are scheduling appt next week.   Edinburgh Postnatal Depression Scale - 11/26/23 1419       Edinburgh Postnatal Depression Scale:  In the Past 7 Days   I have been able to laugh and see the funny side of things. 2    I have looked forward with enjoyment to things. 2    I have blamed myself unnecessarily when things went wrong. 1    I have been anxious or worried for no good reason. 3    I have felt scared or panicky for no good reason. 2    Things have been getting on top of me. 2    I have been so unhappy that I have had difficulty sleeping. 3    I have felt sad or miserable. 3  I have been so unhappy that I have been crying. 2    The thought of harming myself has occurred to me. 2    Edinburgh Postnatal Depression Scale Total 22            Baby's course has been uncomplicated. Baby is feeding by breast and bottle: milk supply adequate. Infant has a pediatrician/family doctor? Yes.  Childcare strategy if returning to work/school: n/a-stay at home mom.  Pt has material needs met for her and baby: Yes.   Review of Systems:   Pertinent items are noted in HPI Denies Abnormal vaginal discharge w/ itching/odor/irritation, headaches, visual changes, shortness of breath, chest pain, abdominal pain,  severe nausea/vomiting, or problems with urination or bowel movements. Pertinent History Reviewed:  Reviewed past medical,surgical, obstetrical and family history.  Reviewed problem list, medications and allergies. OB History  Gravida Para Term Preterm AB Living  3 2 2  1 2   SAB IAB Ectopic Multiple Live Births  1   0 2    # Outcome Date GA Lbr Len/2nd Weight Sex Type Anes PTL Lv  3 Term 10/05/23 [redacted]w[redacted]d  6 lb 9.8 oz (3 kg) F CS-LTranv Spinal  LIV  2 SAB 09/2022          1 Term 10/19/21 [redacted]w[redacted]d  4 lb 14.3 oz (2.22 kg) M CS-LTranv Spinal N LIV     Complications: Fetal Intolerance, Fetal growth restriction antepartum   Physical Assessment:   Vitals:   11/26/23 1416  BP: 115/74  Pulse: (!) 111  There is no height or weight on file to calculate BMI.         Physical Examination:   General:  Alert, oriented and cooperative.   Mental Status: Normal mood and affect perceived.   Rest of physical exam deferred due to type of encounter  C/S incision: appears to be healing well, no s/s infection       No results found for this or any previous visit (from the past 24 hours).  Assessment & Plan:  1) Postpartum exam 2) 7 wks s/p repeat cesarean section, low transverse incision 3) breast & bottle feeding 4) Depression screening 5) Contraception management: rx Phexxi to [facility], discussed use 6) Dep/anx w/ occ thoughts of self harm> going through a lot of situational stress. Restart lexapro, 10mg  x 1wk, then 15mg  daily thereafter, understands can take few weeks to notice improvement. Had appt w/ Jamie/IBH this week, is scheduling appt w/ therapist through PCP next week. Denies plan or that she would ever act on thoughts. Promises if thinks of acting on thoughts will go straight to hospital. [facility] info, can do 24/7 walk in appt. F/U 4wks 7) Hx hyperthyroidism> no meds, prefers to check TSH w/ [place], will do at next visit  Essential components of care per ACOG recommendations:  1.  Mood  and well being:  If positive depression screen, discussed and plan developed.  If using tobacco we discussed reduction/cessation and risk of relapse If current substance abuse, we discussed and referral to local resources was offered.   2. Infant care and feeding:  If breastfeeding, discussed returning to work, pumping, breastfeeding-associated pain, guidance regarding return to fertility while lactating if not using another method. If needed, patient was provided with a letter to be allowed to pump q 2-3hrs to support lactation in a private location with access to a refrigerator to store breastmilk.   Recommended that all caregivers be immunized for flu, pertussis and other preventable communicable diseases  If pt does not have material needs met for her/baby, referred to local resources for help obtaining these.  3. Sexuality, contraception and birth spacing Provided guidance regarding sexuality, management of dyspareunia, and resumption of intercourse Discussed avoiding interpregnancy interval <5mths and recommended birth spacing of 18 months  4. Sleep and fatigue Discussed coping options for fatigue and sleep disruption Encouraged family/partner/community support of 4 hrs of uninterrupted sleep to help with mood and fatigue  5. Physical recovery  If pt had a C/S, assessed incisional pain and providing guidance on normal vs prolonged recovery If pt had a laceration, perineal healing and pain reviewed.  If urinary or fecal incontinence, discussed management and referred to PT or uro/gyn if indicated  Patient is safe to resume physical activity. Discussed attainment of healthy weight.  6.  Chronic disease management Discussed pregnancy complications if any, and their implications for future childbearing and long-term maternal health. Review recommendations for prevention of recurrent pregnancy complications, such as 17 hydroxyprogesterone caproate to reduce risk for recurrent PTB not  applicable, or aspirin to reduce risk of preeclampsia not applicable. Pt had GDM: No. If yes, 2hr GTT scheduled: not applicable. Reviewed medications and non-pregnant dosing including consideration of whether pt is breastfeeding using a reliable resource such as LactMed: yes Referred for f/u w/ PCP or subspecialist providers as indicated: yes  7. Health maintenance Mammogram at 24yo or earlier if indicated Pap smears as indicated  Meds:  Meds ordered this encounter  Medications   Lactic Ac-Citric Ac-Pot Bitart (PHEXXI) 1.8-1-0.4 % GEL    Sig: Place 1 Applicatorful vaginally once for 1 dose. Up to 1 hour before sex    Dispense:  180 g    Refill:  11   escitalopram (LEXAPRO) 10 MG tablet    Sig: Take 1 tablet (10 mg total) by mouth daily. X 1 week, then add 5mg  tablet to equal 15mg  daily thereafter    Dispense:  30 tablet    Refill:  6   escitalopram (LEXAPRO) 5 MG tablet    Sig: Take 1 tablet (5 mg total) by mouth daily. Together w/ 10mg  tablet to equal 15mg  after 1 week of being on 10mg     Dispense:  30 tablet    Refill:  6    Follow-up: Return in about 4 weeks (around 12/24/2023) for Pap & physical and f/u on meds.   No orders of the defined types were placed in this encounter.    I provided 15 minutes of non-face-to-face time during this encounter.  Cheral Marker CNM, [facility] 11/26/2023 4:19 PM

## 2023-11-26 NOTE — Patient Instructions (Addendum)
 Tips To Increase Milk Supply Lots of water! Enough so that your urine is clear Plenty of calories, if you're not getting enough calories, your milk supply can decrease Breastfeed/pump often, every 2-3 hours x 20-73mins Fenugreek 3 pills 3 times a day, this may make your urine smell like maple syrup Mother's Milk Tea Lactation cookies, google for the recipe Real oatmeal Body Armor sports drinks Liquid Gold Greater Than hydration drink   [facility]  [address] [place], [place] [address] Phone: [phone] Hours: Mon-Fri 8AM-5PM  If you're a walk-in patient there is generally a wait time involved on a "first come, first served basis" otherwise contact us beforehand to setup an appointment. If you're in crisis please use our 24-Hour Crisis Hotline for immediate access to a clinician. 24 Hour Crisis Line: [phone]   If this is your first time please bring the following with you: Insurance/Medicaid Card ID or Social Security Card Any referral documentation  Payment Options Individuals with Medicaid have no obligation for a copay. Individuals with Medicare or private insurance will be obligated to meet their policy's requirement(s). Individuals who are uninsured will be eligible for a sliding or discounted scaled as defined by the relevant Managed Care Organization/Local Management Entity  Services:  Substance Abuse Outpatient Treatment Mental Health Outpatient Treatment Psychiatric Services/ Medical Services Advanced Access/Walk-In Clinic Mobile Crisis Management Services ACTT (Assertive Community Treatment Team) Dialectic Behavioral Therapy Critical Time Intervention Psychosocial Rehabilitation [facility]) Medication Assisted Treatment

## 2023-12-24 ENCOUNTER — Other Ambulatory Visit (HOSPITAL_COMMUNITY)
Admission: RE | Admit: 2023-12-24 | Discharge: 2023-12-24 | Disposition: A | Discharge status: Home / Self Care | Payer: MEDICAID | Source: Ambulatory Visit | Attending: Women's Health | Admitting: Women's Health

## 2023-12-24 ENCOUNTER — Ambulatory Visit: Payer: MEDICAID | Admitting: Women's Health

## 2023-12-24 ENCOUNTER — Encounter: Payer: Self-pay | Admitting: Women's Health

## 2023-12-24 VITALS — BP 109/73 | HR 89 | Ht 62.0 in | Wt 123.0 lb

## 2023-12-24 DIAGNOSIS — Z113 Encounter for screening for infections with a predominantly sexual mode of transmission: Secondary | ICD-10-CM | POA: Insufficient documentation

## 2023-12-24 DIAGNOSIS — Z01419 Encounter for gynecological examination (general) (routine) without abnormal findings: Secondary | ICD-10-CM | POA: Insufficient documentation

## 2023-12-24 DIAGNOSIS — N898 Other specified noninflammatory disorders of vagina: Secondary | ICD-10-CM | POA: Insufficient documentation

## 2023-12-24 DIAGNOSIS — F418 Other specified anxiety disorders: Secondary | ICD-10-CM | POA: Diagnosis not present

## 2023-12-24 NOTE — Progress Notes (Signed)
 WELL-WOMAN EXAMINATION Patient name: Sandy Carter MRN [id number]  Date of birth: 2001/01/02 Chief Complaint:   Gynecologic Exam (Follow up on meds; pt is separated)  History of Present Illness:   Sandy Carter is a 23 y.o. G71P2012 Caucasian female being seen today for a routine well-woman exam.  Current complaints: separated, partner was cheating, wants STD screen.  Feels much better w/ lexapro, no SI/thoughts of self harm.   PCP: [facility]      does not desire labs Patient's last menstrual period was 12/17/2023 (exact date). The current method of family planning is abstinence and has Phexxi .  Last pap 05/01/21. Results were:  neg . H/O abnormal pap: no Last mammogram: never. Results were: N/A. Family h/o breast cancer: no Last colonoscopy: never. Results were: N/A. Family h/o colorectal cancer: no     12/24/2023   11:02 AM 11/23/2023    1:27 PM 07/22/2023    8:50 AM 04/14/2023    2:24 PM 08/16/2021    9:48 AM  Depression screen PHQ 2/9  Decreased Interest 0 0 0 3 0  Down, Depressed, Hopeless 0 3 1 3 1   PHQ - 2 Score 0 3 1 6 1   Altered sleeping 2 3 2 2 1   Tired, decreased energy 1 3 2 3 2   Change in appetite 1 0 0 2 2  Feeling bad or failure about yourself  1 3 0 2 0  Trouble concentrating 2 0 2 3 2   Moving slowly or fidgety/restless 0 0 0 0 0  Suicidal thoughts 0 1 0 0 0  PHQ-9 Score 7 13 7 18 8         12/24/2023   11:02 AM 11/23/2023    1:34 PM 04/14/2023    2:24 PM 08/16/2021    9:49 AM  GAD 7 : Generalized Anxiety Score  Nervous, Anxious, on Edge 3 3 3 2   Control/stop worrying 3 3 3 1   Worry too much - different things 3 0 3 3  Trouble relaxing 2 3 3 1   Restless 0 0 1 0  Easily annoyed or irritable 1 3 3 1   Afraid - awful might happen 0 0 2 0  Total GAD 7 Score 12 12 18 8      Review of Systems:   Pertinent items are noted in HPI Denies any headaches, blurred vision, fatigue, shortness of breath, chest pain, abdominal pain, abnormal vaginal  discharge/itching/odor/irritation, problems with periods, bowel movements, urination, or intercourse unless otherwise stated above. Pertinent History Reviewed:  Reviewed past medical,surgical, social and family history.  Reviewed problem list, medications and allergies. Physical Assessment:   Vitals:   12/24/23 1045  BP: 109/73  Pulse: 89  Weight: 123 lb (55.8 kg)  Height: 5\' 2"  (1.575 m)  Body mass index is 22.5 kg/m.        Physical Examination:   General appearance - well appearing, and in no distress  Mental status - alert, oriented to person, place, and time  Psych:  She has a normal mood and affect  Skin - warm and dry, normal color, no suspicious lesions noted  Chest - effort normal, all lung fields clear to auscultation bilaterally  Heart - normal rate and regular rhythm  Neck:  midline trachea, no thyromegaly or nodules  Breasts - breasts appear normal, no suspicious masses, no skin or nipple changes or  axillary nodes  Abdomen - soft, nontender, nondistended, no masses or organomegaly  Pelvic - VULVA: normal appearing vulva with no  masses, tenderness or lesions  VAGINA: normal appearing vagina with normal color and discharge, no lesions  CERVIX: normal appearing cervix without discharge or lesions, no CMT  Thin prep pap is done w/ HR HPV cotesting  UTERUS: uterus is felt to be normal size, shape, consistency and nontender   ADNEXA: No adnexal masses or tenderness noted.  Extremities:  No swelling or varicosities noted  Chaperone: Alphonso Aschoff  No results found for this or any previous visit (from the past 24 hours).  Assessment & Plan:  1) Well-Woman Exam  2) Vaginal d/c w/ odor> CV swab  3) STD screen> CV swab  4) Dep/anx, situational stress> much better on lexapro 15mg   Labs/procedures today: pap, CV swab  Mammogram: @ 24yo, or sooner if problems Colonoscopy: @ 24yo, or sooner if problems  No orders of the defined types were placed in this  encounter.   Meds: No orders of the defined types were placed in this encounter.   Follow-up: Return in about 1 year (around 12/23/2024) for Physical.  Ferd Householder CNM, [facility] 12/24/2023 11:23 AM

## 2023-12-25 LAB — CERVICOVAGINAL ANCILLARY ONLY
Bacterial Vaginitis (gardnerella): POSITIVE — AB
Candida Glabrata: NEGATIVE
Candida Vaginitis: NEGATIVE
Chlamydia: NEGATIVE
Comment: NEGATIVE
Comment: NEGATIVE
Comment: NEGATIVE
Comment: NEGATIVE
Comment: NEGATIVE
Comment: NORMAL
Neisseria Gonorrhea: NEGATIVE
Trichomonas: NEGATIVE

## 2023-12-28 ENCOUNTER — Encounter: Payer: Self-pay | Admitting: Women's Health

## 2023-12-28 LAB — CYTOLOGY - PAP
Chlamydia: NEGATIVE
Comment: NEGATIVE
Comment: NORMAL
Diagnosis: NEGATIVE
Neisseria Gonorrhea: NEGATIVE

## 2023-12-28 MED ORDER — METRONIDAZOLE 500 MG PO TABS: 500.0000 mg | ORAL_TABLET | Freq: Two times a day (BID) | ORAL | 0 refills | Status: DC

## 2023-12-28 MED ORDER — METRONIDAZOLE 0.75 % VA GEL: 1.0000 | Freq: Every day | VAGINAL | 0 refills | Status: DC

## 2023-12-28 NOTE — Addendum Note (Signed)
 Addended by: Ferd Householder on: 12/28/2023 10:37 AM   Modules accepted: Orders

## 2024-07-03 ENCOUNTER — Encounter: Payer: Self-pay | Admitting: Women's Health

## 2024-07-06 ENCOUNTER — Encounter: Payer: Self-pay | Admitting: Advanced Practice Midwife

## 2024-07-06 ENCOUNTER — Other Ambulatory Visit (HOSPITAL_COMMUNITY)
Admission: RE | Admit: 2024-07-06 | Discharge: 2024-07-06 | Disposition: A | Discharge status: Home / Self Care | Payer: MEDICAID | Source: Ambulatory Visit | Attending: Advanced Practice Midwife | Admitting: Advanced Practice Midwife

## 2024-07-06 ENCOUNTER — Ambulatory Visit (INDEPENDENT_AMBULATORY_CARE_PROVIDER_SITE_OTHER): Payer: MEDICAID | Admitting: Advanced Practice Midwife

## 2024-07-06 VITALS — BP 106/72 | HR 86 | Ht 62.0 in | Wt 117.4 lb

## 2024-07-06 DIAGNOSIS — Z8639 Personal history of other endocrine, nutritional and metabolic disease: Secondary | ICD-10-CM

## 2024-07-06 DIAGNOSIS — S7010XA Contusion of unspecified thigh, initial encounter: Secondary | ICD-10-CM | POA: Diagnosis not present

## 2024-07-06 DIAGNOSIS — N898 Other specified noninflammatory disorders of vagina: Secondary | ICD-10-CM | POA: Insufficient documentation

## 2024-07-06 DIAGNOSIS — T148XXA Other injury of unspecified body region, initial encounter: Secondary | ICD-10-CM

## 2024-07-06 NOTE — Progress Notes (Signed)
 GYN VISIT Patient name: Sandy Carter MRN [id number]  Date of birth: 2001-01-21 Chief Complaint:   Gynecologic Exam (Discharge and spotting)  History of Present Illness:   Sandy Carter is a 23 y.o. [phone] Caucasian female being seen today for concern of mid-cycle bleeding; LMP from 10/12-10/16; had unprotected sex 10/16 and 10/19 (used Phexx after IC 10/19). Then had spotting 10/24-10/27 and and increased amt of vag d/c one of those days. Currently not having abnl discharge. Reports hx of hyperthyroidism and now having decreased appetite; also reports bruising much more easily.     Patient's last menstrual period was 06/19/2024. The current method of family planning is Phexx.  Last pap April 2025. Results were: NILM w/ HRHPV not done     12/24/2023   11:02 AM 11/23/2023    1:27 PM 07/22/2023    8:50 AM 04/14/2023    2:24 PM 08/16/2021    9:48 AM  Depression screen PHQ 2/9  Decreased Interest 0 0 0 3 0  Down, Depressed, Hopeless 0 3 1 3 1   PHQ - 2 Score 0 3 1 6 1   Altered sleeping 2 3 2 2 1   Tired, decreased energy 1 3 2 3 2   Change in appetite 1 0 0 2 2  Feeling bad or failure about yourself  1 3 0 2 0  Trouble concentrating 2 0 2 3 2   Moving slowly or fidgety/restless 0 0 0 0 0  Suicidal thoughts 0 1 0 0 0  PHQ-9 Score 7 13 7 18 8         12/24/2023   11:02 AM 11/23/2023    1:34 PM 04/14/2023    2:24 PM 08/16/2021    9:49 AM  GAD 7 : Generalized Anxiety Score  Nervous, Anxious, on Edge 3 3 3 2   Control/stop worrying 3 3 3 1   Worry too much - different things 3 0 3 3  Trouble relaxing 2 3 3 1   Restless 0 0 1 0  Easily annoyed or irritable 1 3 3 1   Afraid - awful might happen 0 0 2 0  Total GAD 7 Score 12 12 18 8      Review of Systems:   Pertinent items are noted in HPI Denies fever/chills, dizziness, headaches, visual disturbances, fatigue, shortness of breath, chest pain, abdominal pain, vomiting, abnormal vaginal discharge/itching/odor/irritation, problems with  periods, bowel movements, urination, or intercourse unless otherwise stated above.  Pertinent History Reviewed:  Reviewed past medical,surgical, social, obstetrical and family history.  Reviewed problem list, medications and allergies. Physical Assessment:   Vitals:   07/06/24 1413  BP: 106/72  Pulse: 86  Weight: 117 lb 6.4 oz (53.3 kg)  Height: 5' 2 (1.575 m)  Body mass index is 21.47 kg/m.       Physical Examination:   General appearance: alert, well appearing, and in no distress  Mental status: alert, oriented to person, place, and time  Skin: warm & dry   Cardiovascular: normal heart rate noted  Respiratory: normal respiratory effort, no distress  Abdomen: soft, non-tender   Pelvic: normal external genitalia, vulva, vagina, cervix, uterus and adnexa; mod amt creamy white d/c; no blood noted on SE  Extremities: no edema; sm bruises over bilat thighs  Chaperone: Peggy Dones  No results found for this or any previous visit (from the past 24 hours).  Assessment & Plan:  1) Mid-cycle spotting> concern for implantation bldg (had essentially unprotected IC on Day 7); reviewed not likely but encouraged her  to use Phexx prior to each act of IC; CV swab collected due to increased vag d/c a few days ago  2) Bruising> has small bruises all over thighs in various stages of healing that she says is unusual for her; CBC ordered  3) Hx hyperthyroidism> having decreased appetite and would like to have TSH checked  Meds: No orders of the defined types were placed in this encounter.   Orders Placed This Encounter  Procedures   TSH   CBC    Return for prn.  Suzen JONETTA Gentry CNM 07/06/2024 2:49 PM

## 2024-07-07 LAB — CBC
Hematocrit: 38.2 % (ref 34.0–46.6)
Hemoglobin: 12.7 g/dL (ref 11.1–15.9)
MCH: 30.9 pg (ref 26.6–33.0)
MCHC: 33.2 g/dL (ref 31.5–35.7)
MCV: 93 fL (ref 79–97)
Platelets: 303 x10E3/uL (ref 150–450)
RBC: 4.11 x10E6/uL (ref 3.77–5.28)
RDW: 12.5 % (ref 11.7–15.4)
WBC: 6.4 x10E3/uL (ref 3.4–10.8)

## 2024-07-07 LAB — TSH: TSH: 1.99 u[IU]/mL (ref 0.450–4.500)

## 2024-07-08 LAB — CERVICOVAGINAL ANCILLARY ONLY
Bacterial Vaginitis (gardnerella): POSITIVE — AB
Candida Glabrata: NEGATIVE
Candida Vaginitis: NEGATIVE
Chlamydia: NEGATIVE
Comment: NEGATIVE
Comment: NEGATIVE
Comment: NEGATIVE
Comment: NEGATIVE
Comment: NEGATIVE
Comment: NORMAL
Neisseria Gonorrhea: NEGATIVE
Trichomonas: NEGATIVE

## 2024-07-13 ENCOUNTER — Other Ambulatory Visit: Payer: Self-pay | Admitting: Advanced Practice Midwife

## 2024-07-13 ENCOUNTER — Ambulatory Visit: Payer: Self-pay | Admitting: Advanced Practice Midwife

## 2024-07-13 DIAGNOSIS — B9689 Other specified bacterial agents as the cause of diseases classified elsewhere: Secondary | ICD-10-CM

## 2024-07-13 MED ORDER — METRONIDAZOLE 500 MG PO TABS: 500.0000 mg | ORAL_TABLET | Freq: Two times a day (BID) | ORAL | 0 refills | Status: AC

## 2024-07-18 ENCOUNTER — Encounter: Payer: Self-pay | Admitting: Women's Health

## 2024-08-16 ENCOUNTER — Encounter: Payer: Self-pay | Admitting: Obstetrics & Gynecology

## 2024-08-16 ENCOUNTER — Ambulatory Visit: Payer: MEDICAID | Admitting: Obstetrics & Gynecology

## 2024-08-16 VITALS — BP 105/70 | HR 102 | Ht 62.0 in | Wt 118.0 lb

## 2024-08-16 DIAGNOSIS — Z3009 Encounter for other general counseling and advice on contraception: Secondary | ICD-10-CM | POA: Diagnosis not present

## 2024-08-16 NOTE — Progress Notes (Signed)
 "     Chief Complaint  Patient presents with   Discuss tubal      23 y.o. [id number] Patient's last menstrual period was 08/01/2024. The current method of family planning is none.  Outpatient Encounter Medications as of 08/16/2024  Medication Sig Note   escitalopram  (LEXAPRO ) 10 MG tablet Take 1 tablet (10 mg total) by mouth daily. X 1 week, then add 5mg  tablet to equal 15mg  daily thereafter    escitalopram  (LEXAPRO ) 5 MG tablet Take 1 tablet (5 mg total) by mouth daily. Together w/ 10mg  tablet to equal 15mg  after 1 week of being on 10mg     hydrOXYzine  (ATARAX ) 25 MG tablet Take 25 mg by mouth every 6 (six) hours as needed.    PHEXXI  1.8-1-0.4 % GEL     Blood Pressure Monitor MISC For regular home bp monitoring during pregnancy (Patient not taking: Reported on 08/16/2024)    metroNIDAZOLE  (FLAGYL ) 500 MG tablet Take 1 tablet (500 mg total) by mouth 2 (two) times daily. (Patient not taking: Reported on 08/16/2024)    [DISCONTINUED] ARIPiprazole  (ABILIFY ) 5 MG tablet Take 1 tablet (5 mg total) by mouth 2 (two) times daily. 02/20/2016: Has not had prescription filled   [DISCONTINUED] etonogestrel  (NEXPLANON ) 68 MG IMPL implant 1 each by Subdermal route once. Implanted 02/05/16    [DISCONTINUED] FLUoxetine  (PROZAC ) 40 MG capsule Take 1 capsule (40 mg total) by mouth daily. 02/20/2016: Has not had prescription filled   [DISCONTINUED] lamoTRIgine  (LAMICTAL ) 200 MG tablet Take 1 tablet (200 mg total) by mouth every evening. 02/20/2016: Has not had prescription filled   [DISCONTINUED] prazosin  (MINIPRESS ) 1 MG capsule Take 1 capsule (1 mg total) by mouth at bedtime. 02/20/2016: Has not had prescription filled   No facility-administered encounter medications on file as of 08/16/2024.    Subjective   Established Patient Office Visit  Subjective   Patient ID: Sandy Carter, female    DOB: 2001/04/19  Age: 23 y.o. MRN: [id number]  Chief Complaint  Patient presents with   Discuss tubal     HPI Discussed all BCM options Pt was under the false impression a tubal could be done in a way to be reversed I informed her an dher mom that tubal ligation was considered permanent and could be reversed only at great expense and 50/50 success Will consider Mirena  IUD    ROS    Objective:     BP 105/70 (BP Location: Right Arm, Patient Position: Sitting, Cuff Size: Normal)   Pulse (!) 102   Ht 5' 2 (1.575 m)   Wt 118 lb (53.5 kg)   LMP 08/01/2024   BMI 21.58 kg/m    Physical Exam [facility] NAD  No results found for any visits on 08/16/24.    The ASCVD Risk score (Arnett DK, et al., 2019) failed to calculate for the following reasons:   The 2019 ASCVD risk score is only valid for ages 44 to 74   * - Cholesterol units were assumed    Assessment & Plan:   Problem List Items Addressed This Visit   None   No follow-ups on file.    Vonn VEAR Inch, MD  Past Medical History:  Diagnosis Date   ADD (attention deficit disorder)     in her teens   Anxiety    Depression    Dysrhythmia    palpitations - from Hyperthyroidism   Headache    Hyperthyroidism    not treated at this time   Lumbar herniated disc  ODD (oppositional defiant disorder)    in her teens   PONV (postoperative nausea and vomiting)    Seizures (HCC)    shakes alot, is aware of what is going on, feels them coming on, has not seen a neurologist   Tuberculosis    before age 10    Past Surgical History:  Procedure Laterality Date   CESAREAN SECTION N/A 10/19/2021   Procedure: CESAREAN SECTION;  Surgeon: Ozan, Jennifer, DO;  Location: [facility];  Service: Obstetrics;  Laterality: N/A;   CESAREAN SECTION N/A 10/05/2023   Procedure: CESAREAN SECTION;  Surgeon: Barbra Lang PARAS, DO;  Location: [facility];  Service: Obstetrics;  Laterality: N/A;   COLONOSCOPY WITH PROPOFOL  N/A 09/17/2020   Procedure: COLONOSCOPY WITH PROPOFOL ;  Surgeon: Cindie Carlin POUR, DO;  Location: [facility];  Service:  Endoscopy;  Laterality: N/A;  2:00pm   LAPAROSCOPIC APPENDECTOMY N/A 10/17/2021   Procedure: APPENDECTOMY LAPAROSCOPIC;  Surgeon: Teresa Lonni HERO, MD;  Location: [facility];  Service: General;  Laterality: N/A;   LUMBAR LAMINECTOMY/DECOMPRESSION MICRODISCECTOMY Right 12/28/2020   Procedure: Right Lumbar Five Sacral One Microdiscectomy;  Surgeon: Gillie Duncans, MD;  Location: [facility];  Service: Neurosurgery;  Laterality: Right;   OPEN REDUCTION INTERNAL FIXATION (ORIF) METACARPAL Right 12/08/2022   Procedure: OPEN REDUCTION INTERNAL FIXATION (ORIF) METACARPAL;  Surgeon: Onesimo Oneil LABOR, MD;  Location: [facility];  Service: Orthopedics;  Laterality: Right;   WISDOM TOOTH EXTRACTION      OB History     Gravida  3   Para  2   Term  2   Preterm      AB  1   Living  2      SAB  1   IAB      Ectopic      Multiple  0   Live Births  2           Allergies  Allergen Reactions   Depo-Provera  [Medroxyprogesterone  Acetate] Hives, Shortness Of Breath and Cough   Penicillins Rash    Reaction: 5 years   Gabapentin  Other (See Comments)    Headaches, lightheaded   Other Nausea And Vomiting    Pt has nausea and vomiting post procedure    Penicillin G Other (See Comments)    Social History   Socioeconomic History   Marital status: Legally Separated    Spouse name: Not on file   Number of children: Not on file   Years of education: Not on file   Highest education level: Not on file  Occupational History   Not on file  Tobacco Use   Smoking status: Former    Types: Cigarettes   Smokeless tobacco: Never  Vaping Use   Vaping status: Former  Substance and Sexual Activity   Alcohol use: Yes    Comment: occ   Drug use: Not Currently    Types: Marijuana    Comment: occasionaly, currently stopped- last time smoked- this mth   Sexual activity: Yes    Birth control/protection: None  Other Topics Concern   Not on file  Social History Narrative   Not on file   Social Drivers of  Health   Financial Resource Strain: Medium Risk (12/24/2023)   Overall Financial Resource Strain (CARDIA)    Difficulty of Paying Living Expenses: Somewhat hard  Food Insecurity: No Food Insecurity (12/24/2023)   Hunger Vital Sign    Worried About Running Out of Food in the Last Year: Never true    Ran  Out of Food in the Last Year: Never true  Transportation Needs: No Transportation Needs (12/24/2023)   PRAPARE - Administrator, Civil Service (Medical): No    Lack of Transportation (Non-Medical): No  Physical Activity: Sufficiently Active (12/24/2023)   Exercise Vital Sign    Days of Exercise per Week: 4 days    Minutes of Exercise per Session: 40 min  Stress: Stress Concern Present (12/24/2023)   [employer] of Occupational Health - Occupational Stress Questionnaire    Feeling of Stress : Very much  Social Connections: Socially Isolated (12/24/2023)   Social Connection and Isolation Panel    Frequency of Communication with Friends and Family: More than three times a week    Frequency of Social Gatherings with Friends and Family: Three times a week    Attends Religious Services: Never    Active Member of Clubs or Organizations: No    Attends Banker Meetings: Never    Marital Status: Separated    Family History  Adopted: Yes  Problem Relation Age of Onset   Diabetes Maternal Grandmother    Alcoholism Mother    Bone cancer Mother     Medications:       Current Outpatient Medications:    escitalopram  (LEXAPRO ) 10 MG tablet, Take 1 tablet (10 mg total) by mouth daily. X 1 week, then add 5mg  tablet to equal 15mg  daily thereafter, Disp: 30 tablet, Rfl: 6   escitalopram  (LEXAPRO ) 5 MG tablet, Take 1 tablet (5 mg total) by mouth daily. Together w/ 10mg  tablet to equal 15mg  after 1 week of being on 10mg , Disp: 30 tablet, Rfl: 6   hydrOXYzine  (ATARAX ) 25 MG tablet, Take 25 mg by mouth every 6 (six) hours as needed., Disp: , Rfl:    PHEXXI  1.8-1-0.4 % GEL, ,  Disp: , Rfl:    Blood Pressure Monitor MISC, For regular home bp monitoring during pregnancy (Patient not taking: Reported on 08/16/2024), Disp: 1 each, Rfl: 0   metroNIDAZOLE  (FLAGYL ) 500 MG tablet, Take 1 tablet (500 mg total) by mouth 2 (two) times daily. (Patient not taking: Reported on 08/16/2024), Disp: 14 tablet, Rfl: 0  Objective Blood pressure 105/70, pulse (!) 102, height 5' 2 (1.575 m), weight 118 lb (53.5 kg), last menstrual period 08/01/2024, currently breastfeeding.    Pertinent ROS No burning with urination, frequency or urgency No nausea, vomiting or diarrhea Nor fever chills or other constitutional symptoms   Labs or studies none    Impression + Management Plan: Diagnoses this Encounter::   ICD-10-CM   1. Counseling for initiation of birth control method  Z30.09      Discouraged tubal ligation at this point given her misundertandings    Medications prescribed during  this encounter: No orders of the defined types were placed in this encounter.   Labs or Scans Ordered during this encounter: No orders of the defined types were placed in this encounter.     Follow up Return if symptoms worsen or fail to improve, for mirena  IUD placement if desires.        "

## 2024-08-29 ENCOUNTER — Encounter: Payer: Self-pay | Admitting: Obstetrics & Gynecology

## 2024-08-30 ENCOUNTER — Ambulatory Visit (INDEPENDENT_AMBULATORY_CARE_PROVIDER_SITE_OTHER): Payer: MEDICAID | Admitting: Obstetrics & Gynecology

## 2024-08-30 ENCOUNTER — Encounter: Payer: Self-pay | Admitting: Obstetrics & Gynecology

## 2024-08-30 VITALS — BP 103/69 | HR 90 | Ht 62.0 in | Wt 117.0 lb

## 2024-08-30 DIAGNOSIS — Z3202 Encounter for pregnancy test, result negative: Secondary | ICD-10-CM

## 2024-08-30 DIAGNOSIS — Z30430 Encounter for insertion of intrauterine contraceptive device: Secondary | ICD-10-CM | POA: Diagnosis not present

## 2024-08-30 LAB — POCT URINE PREGNANCY: Preg Test, Ur: NEGATIVE

## 2024-08-30 MED ORDER — LEVONORGESTREL 20 MCG/DAY IU IUD
1.0000 | INTRAUTERINE_SYSTEM | Freq: Once | INTRAUTERINE | Status: AC
  Administered 2024-08-30: 1 via INTRAUTERINE

## 2024-08-30 NOTE — Progress Notes (Signed)
 IUD Insertion Procedure Note  Pre-operative Diagnosis: Birth control/menstrual control  Post-operative Diagnosis: normal  Indications: contraception  Procedure Details  Urine pregnancy test was done today and result was negative.  The risks (including infection, bleeding, pain, and uterine perforation) and benefits of the procedure were explained to the patient and Written informed consent was obtained.    Cervix cleansed with Betadine . Uterus sounded to 7 cm. IUD inserted without difficulty. String visible and trimmed. Patient tolerated procedure well.  IUD Information: Mirena , Lot # [id number], Expiration date 06/2026.  Condition: Stable  Complications: None  Plan:  The patient was advised to call for any fever or for prolonged or severe pain or bleeding. She was advised to use OTC ibuprofen  as needed for mild to moderate pain.   Attending Physician Documentation: I placed the IUD

## 2024-09-10 ENCOUNTER — Encounter: Payer: Self-pay | Admitting: Obstetrics & Gynecology
